# Patient Record
Sex: Male | Born: 1965 | Race: White | Hispanic: No | Marital: Married | Smoking: Former smoker
Health system: Southern US, Community
[De-identification: ages and names within clinical notes are randomized; demographics above are authoritative.]

## PROBLEM LIST (undated history)

## (undated) DIAGNOSIS — F909 Attention-deficit hyperactivity disorder, unspecified type: Secondary | ICD-10-CM

## (undated) DIAGNOSIS — J45909 Unspecified asthma, uncomplicated: Secondary | ICD-10-CM

## (undated) HISTORY — PX: COLON SURGERY: SHX602

## (undated) HISTORY — DX: Attention-deficit hyperactivity disorder, unspecified type: F90.9

## (undated) HISTORY — PX: CHOLECYSTECTOMY: SHX55

---

## 2007-11-12 ENCOUNTER — Ambulatory Visit: Payer: Self-pay | Admitting: Internal Medicine

## 2008-01-08 ENCOUNTER — Ambulatory Visit: Payer: Self-pay | Admitting: Internal Medicine

## 2010-02-23 ENCOUNTER — Ambulatory Visit: Payer: Self-pay | Admitting: Internal Medicine

## 2015-02-25 ENCOUNTER — Ambulatory Visit (INDEPENDENT_AMBULATORY_CARE_PROVIDER_SITE_OTHER): Payer: 59 | Admitting: Psychiatry

## 2015-02-25 ENCOUNTER — Encounter: Payer: Self-pay | Admitting: Psychiatry

## 2015-02-25 VITALS — BP 122/84 | HR 80 | Temp 98.1°F | Ht 69.5 in | Wt 138.6 lb

## 2015-02-25 DIAGNOSIS — F902 Attention-deficit hyperactivity disorder, combined type: Secondary | ICD-10-CM | POA: Diagnosis not present

## 2015-02-25 MED ORDER — AMPHETAMINE-DEXTROAMPHETAMINE 20 MG PO TABS: 20.0000 mg | ORAL_TABLET | Freq: Two times a day (BID) | ORAL | Status: DC

## 2015-02-25 NOTE — Progress Notes (Signed)
BH MD/PA/NP OP Progress Note  02/25/2015 4:17 PM Vincent BaleRobert L [name]  MRN:  [id number]  Subjective:  Patient reports that he is feeling good. Concentration and focus remain stable. Mood is good. There is no sign of hyperactivity or anger or mood lability. Tolerating medicine well without any side effects. He is optimistic and upbeat about the future. Sleeping well. No new physical complaints. Chief Complaint:  Visit Diagnosis:     ICD-9-CM ICD-10-CM   1. ADHD (attention deficit hyperactivity disorder), combined type 314.01 F90.2     Past Medical History:  Past Medical History  Diagnosis Date  . ADHD (attention deficit hyperactivity disorder)     Past Surgical History  Procedure Laterality Date  . Cholecystectomy     Family History:  Family History  Problem Relation Age of Onset  . Diabetes Mother   . Depression Sister    Social History:  History   Social History  . Marital Status: Married    Spouse Name: N/A  . Number of Children: N/A  . Years of Education: N/A   Social History Main Topics  . Smoking status: Former Smoker    Quit date: 08/19/2014  . Smokeless tobacco: Never Used  . Alcohol Use: No  . Drug Use: No  . Sexual Activity: No   Other Topics Concern  . None   Social History Narrative  . None   Additional History: He reports that he has been stable on his medicine. Work is going well. Social life is active. No major changes coming up in the near future. Patient can continue current dose of Adderall 20 mg twice a day.  Assessment:   Musculoskeletal: Strength & Muscle Tone: within normal limits Gait & Station: normal Patient leans: N/A  Psychiatric Specialty Exam: HPI  ROS  Blood pressure 122/84, pulse 80, temperature 98.1 F (36.7 C), temperature source Tympanic, height 5' 9.5" (1.765 m), weight 138 lb 9.6 oz (62.869 kg), SpO2 94 %.Body mass index is 20.18 kg/(m^2).  General Appearance: Well Groomed  Eye Contact:  Good  Speech:  Normal Rate  Volume:   Normal  Mood:  Euthymic  Affect:  Congruent  Thought Process:  Coherent  Orientation:  Full (Time, Place, and Person)  Thought Content:  Negative  Suicidal Thoughts:  No  Homicidal Thoughts:  No  Memory:  Immediate;   Good Recent;   Good Remote;   Good  Judgement:  Intact  Insight:  Good  Psychomotor Activity:  Normal  Concentration:  Good  Recall:  Good  Fund of Knowledge: Good  Language: Good  Akathisia:  No  Handed:  Right  AIMS (if indicated):         Assets:  Communication Skills Desire for Improvement Financial Resources/Insurance Housing Physical Health Resilience  ADL's:  Intact  Cognition: WNL  Sleep:  good   Is the patient at risk to self?  No. Has the patient been a risk to self in the past 6 months?  No. Has the patient been a risk to self within the distant past?  No. Is the patient a risk to others?  No. Has the patient been a risk to others in the past 6 months?  No. Has the patient been a risk to others within the distant past?  No.  Current Medications: Current Outpatient Prescriptions  Medication Sig Dispense Refill  . amphetamine-dextroamphetamine (ADDERALL) 20 MG tablet Take 1 tablet (20 mg total) by mouth 2 (two) times daily. 60 tablet 0   No current facility-administered  medications for this visit.    Medical Decision Making:  Established Problem, Stable/Improving (1), Review of Psycho-Social Stressors (1) and Review of Medication Regimen & Side Effects (2)  Treatment Plan Summary:Medication management and Plan Review medication. Review side effects. Supportive and educational counseling completed. No change to prescription. Recommend follow-up in another month. Patient agreeable to the plan.   John Clapacs 02/25/2015, 4:17 PM

## 2015-02-26 MED ORDER — AMPHETAMINE-DEXTROAMPHETAMINE 20 MG PO TABS: 20.0000 mg | ORAL_TABLET | Freq: Two times a day (BID) | ORAL | Status: DC

## 2015-02-26 NOTE — Addendum Note (Signed)
Addended by: Mordecai RasmussenLAPACS, JOHN T on: 02/26/2015 09:19 AM   Modules accepted: Orders

## 2015-03-25 ENCOUNTER — Encounter: Payer: Self-pay | Admitting: Psychiatry

## 2015-03-25 ENCOUNTER — Ambulatory Visit (INDEPENDENT_AMBULATORY_CARE_PROVIDER_SITE_OTHER): Payer: 59 | Admitting: Psychiatry

## 2015-03-25 VITALS — BP 110/76 | HR 79 | Temp 97.0°F | Ht 70.0 in | Wt 139.4 lb

## 2015-03-25 DIAGNOSIS — F902 Attention-deficit hyperactivity disorder, combined type: Secondary | ICD-10-CM

## 2015-03-25 DIAGNOSIS — F32A Depression, unspecified: Secondary | ICD-10-CM | POA: Insufficient documentation

## 2015-03-25 DIAGNOSIS — F988 Other specified behavioral and emotional disorders with onset usually occurring in childhood and adolescence: Secondary | ICD-10-CM | POA: Insufficient documentation

## 2015-03-25 DIAGNOSIS — F419 Anxiety disorder, unspecified: Secondary | ICD-10-CM | POA: Insufficient documentation

## 2015-03-25 MED ORDER — AMPHETAMINE-DEXTROAMPHETAMINE 20 MG PO TABS: 20.0000 mg | ORAL_TABLET | Freq: Two times a day (BID) | ORAL | Status: DC

## 2015-03-25 NOTE — Progress Notes (Signed)
BH MD/PA/NP OP Progress Note  03/25/2015 6:54 PM Vincent BaleRobert L [name]  MRN:  [id number]  Subjective:  "I'm really doing very well" Chief Complaint:  mood is good. Concentration and focus are good. Visit Diagnosis:  No diagnosis found. ADHD  Past Medical History:  Past Medical History  Diagnosis Date  . ADHD (attention deficit hyperactivity disorder)     Past Surgical History  Procedure Laterality Date  . Cholecystectomy     Family History:  Family History  Problem Relation Age of Onset  . Diabetes Mother   . Alzheimer's disease Mother   . Depression Sister   . Hypertension Father    Social History:  History   Social History  . Marital Status: Married    Spouse Name: N/A  . Number of Children: N/A  . Years of Education: N/A   Social History Main Topics  . Smoking status: Former Smoker    Quit date: 08/19/2014  . Smokeless tobacco: Never Used  . Alcohol Use: Yes     Comment: special - occ  . Drug Use: No  . Sexual Activity: No   Other Topics Concern  . None   Social History Narrative   Additional History: Follow-up for this patient with ADHD. This patient is reporting feeling very good this month. Not manic but functioning very well. Mood is upbeat. Many positive things going on. He is dating, his job is going well, his health is good. No sign of any problem side effects or complications from his medicine. Focus is good. Concentration good.  Assessment: Stable. Tolerating medicine well. No new side effects. Benefits from monthly counseling and review of symptoms.  Musculoskeletal: Strength & Muscle Tone: within normal limits Gait & Station: normal Patient leans: N/A  Psychiatric Specialty Exam: HPI  ROS  Blood pressure 110/76, pulse 79, temperature 97 F (36.1 C), temperature source Tympanic, height 5\' 10"  (1.778 m), weight 139 lb 6.4 oz (63.231 kg), SpO2 95 %.Body mass index is 20 kg/(m^2).  General Appearance: Neat and Well Groomed  Eye Contact:  Good   Speech:  Clear and Coherent  Volume:  Normal  Mood:  Euthymic  Affect:  Appropriate  Thought Process:  Goal Directed  Orientation:  Full (Time, Place, and Person)  Thought Content:  Negative  Suicidal Thoughts:  No  Homicidal Thoughts:  No  Memory:  Immediate;   Good Recent;   Good Remote;   Good  Judgement:  Good  Insight:  Good  Psychomotor Activity:  Normal  Concentration:  Good  Recall:  Good  Fund of Knowledge: Good  Language: Good  Akathisia:  No  Handed:  Right  AIMS (if indicated):     Assets:  Communication Skills Desire for Improvement Financial Resources/Insurance Housing Intimacy Physical Health Resilience Talents/Skills Vocational/Educational  ADL's:  Intact  Cognition: WNL  Sleep:  good   Is the patient at risk to self?  No. Has the patient been a risk to self in the past 6 months?  No. Has the patient been a risk to self within the distant past?  No. Is the patient a risk to others?  No. Has the patient been a risk to others in the past 6 months?  No. Has the patient been a risk to others within the distant past?  No.  Current Medications: Current Outpatient Prescriptions  Medication Sig Dispense Refill  . amphetamine-dextroamphetamine (ADDERALL) 20 MG tablet Take 1 tablet (20 mg total) by mouth 2 (two) times daily. 60 tablet 0  No current facility-administered medications for this visit.    Medical Decision Making:  Established Problem, Stable/Improving (1), Review of Psycho-Social Stressors (1), Review of Last Therapy Session (1) and Review of Medication Regimen & Side Effects (2)  Treatment Plan Summary:Medication management and Plan Supportive counseling. Review of current symptoms. No change to treatment. Follow-up next month   John Clapacs 03/25/2015, 6:54 PM

## 2015-04-22 ENCOUNTER — Ambulatory Visit (INDEPENDENT_AMBULATORY_CARE_PROVIDER_SITE_OTHER): Payer: 59 | Admitting: Psychiatry

## 2015-04-22 ENCOUNTER — Encounter: Payer: Self-pay | Admitting: Psychiatry

## 2015-04-22 VITALS — BP 110/82 | HR 75 | Temp 97.9°F | Ht 70.0 in | Wt 141.4 lb

## 2015-04-22 DIAGNOSIS — F341 Dysthymic disorder: Secondary | ICD-10-CM

## 2015-04-22 DIAGNOSIS — F902 Attention-deficit hyperactivity disorder, combined type: Secondary | ICD-10-CM | POA: Diagnosis not present

## 2015-04-22 MED ORDER — AMPHETAMINE-DEXTROAMPHETAMINE 20 MG PO TABS
20.0000 mg | ORAL_TABLET | Freq: Two times a day (BID) | ORAL | Status: DC
Start: 2015-04-22 — End: 2015-05-27

## 2015-04-22 NOTE — Progress Notes (Signed)
[facility] MD Progress Note  04/22/2015 6:18 PM Vincent [name]  MRN:  [id number] Subjective:  Follow-up note for this patient with ADHD mild chronic anxiety dysthymia. Principal Problem: @ Diagnosis:   Patient Active Problem List   Diagnosis Date Noted  . Dysthymia [F34.1] 04/22/2015  . ADD (attention deficit disorder) [F90.9] 03/25/2015  . Anxiety [F41.9] 03/25/2015  . ADHD (attention deficit hyperactivity disorder), combined type [F90.2] 02/25/2015   Total Time spent with patient: 30 minutes   Past Medical History:  Past Medical History  Diagnosis Date  . ADHD (attention deficit hyperactivity disorder)     Past Surgical History  Procedure Laterality Date  . Cholecystectomy     Family History:  Family History  Problem Relation Age of Onset  . Diabetes Mother   . Alzheimer's disease Mother   . Depression Sister   . Hypertension Father    Social History:  History  Alcohol Use  . Yes    Comment: special - occ     History  Drug Use No    History   Social History  . Marital Status: Married    Spouse Name: N/A  . Number of Children: N/A  . Years of Education: N/A   Social History Main Topics  . Smoking status: Former Smoker    Quit date: 08/19/2014  . Smokeless tobacco: Never Used  . Alcohol Use: Yes     Comment: special - occ  . Drug Use: No  . Sexual Activity: No   Other Topics Concern  . None   Social History Narrative   Additional History:    Sleep: Good  Appetite:  Good   Assessment: Patient had a good month. His relationship with his girlfriend seems to be improving. Work is going well. Focus and concentration remain good. No side effects of stimulants. His anxiety level is under good control and overall his mood and self-confidence have improved. No new complaints.  Musculoskeletal: Strength & Muscle Tone: within normal limits Gait & Station: normal Patient leans: N/A   Psychiatric Specialty Exam: Physical Exam  ROS  Blood pressure  110/82, pulse 75, temperature 97.9 F (36.6 C), temperature source Tympanic, height  (1.778 m), weight 141 lb 6.4 oz (64.139 kg), SpO2 96 %.Body mass index is 20.29 kg/(m^2).  General Appearance: Neat and Well Groomed  Patent attorney::  Good  Speech:  Clear and Coherent  Volume:  Normal  Mood:  Euthymic  Affect:  Full Range  Thought Process:  Coherent  Orientation:  Full (Time, Place, and Person)  Thought Content:  Negative  Suicidal Thoughts:  No  Homicidal Thoughts:  No  Memory:  Immediate;   Good Recent;   Good Remote;   Good  Judgement:  Intact  Insight:  Present  Psychomotor Activity:  Normal  Concentration:  Good  Recall:  Good  Fund of Knowledge:Good  Language: Good  Akathisia:  No  Handed:  Right  AIMS (if indicated):     Assets:  Communication Skills Desire for Improvement Financial Resources/Insurance Housing Intimacy Physical Health Resilience Social Support Talents/Skills Transportation Vocational/Educational  ADL's:  Intact  Cognition: WNL  Sleep:        Current Medications: Current Outpatient Prescriptions  Medication Sig Dispense Refill  . amphetamine-dextroamphetamine (ADDERALL) 20 MG tablet Take 1 tablet (20 mg total) by mouth 2 (two) times daily. 60 tablet 0   No current facility-administered medications for this visit.    Lab Results: No results found for this or any previous visit (from  the past 48 hour(s)).  Physical Findings: AIMS:  , ,  ,  ,    CIWA:    COWS:     Treatment Plan Summary: Medication management and Plan Vincent [name] continues to do well. He benefits from frequent monthly check ups reviewing mood and anxiety symptoms. Supportive counseling completed. Renewed Adderall prescription which she is tolerating well. No change to current treatment plan. Follow-up next month.   Medical Decision Making:  Established Problem, Stable/Improving (1), Review of Psycho-Social Stressors (1) and Review of Medication Regimen & Side  Effects (2)     Vincent [name] 04/22/2015, 6:18 PM

## 2015-05-20 ENCOUNTER — Telehealth: Payer: Self-pay | Admitting: Psychiatry

## 2015-05-20 ENCOUNTER — Ambulatory Visit: Payer: 59 | Admitting: Psychiatry

## 2015-05-21 ENCOUNTER — Ambulatory Visit: Payer: 59

## 2015-05-21 ENCOUNTER — Ambulatory Visit
Admission: EM | Admit: 2015-05-21 | Discharge: 2015-05-21 | Disposition: A | Discharge status: Home / Self Care | Payer: 59 | Attending: Family Medicine | Admitting: Family Medicine

## 2015-05-21 DIAGNOSIS — Z87891 Personal history of nicotine dependence: Secondary | ICD-10-CM | POA: Diagnosis not present

## 2015-05-21 DIAGNOSIS — T148 Other injury of unspecified body region: Secondary | ICD-10-CM | POA: Diagnosis not present

## 2015-05-21 DIAGNOSIS — F419 Anxiety disorder, unspecified: Secondary | ICD-10-CM | POA: Diagnosis not present

## 2015-05-21 DIAGNOSIS — S2232XA Fracture of one rib, left side, initial encounter for closed fracture: Secondary | ICD-10-CM | POA: Insufficient documentation

## 2015-05-21 DIAGNOSIS — R079 Chest pain, unspecified: Secondary | ICD-10-CM | POA: Diagnosis present

## 2015-05-21 DIAGNOSIS — F902 Attention-deficit hyperactivity disorder, combined type: Secondary | ICD-10-CM | POA: Insufficient documentation

## 2015-05-21 DIAGNOSIS — F341 Dysthymic disorder: Secondary | ICD-10-CM | POA: Insufficient documentation

## 2015-05-21 DIAGNOSIS — W19XXXA Unspecified fall, initial encounter: Secondary | ICD-10-CM | POA: Diagnosis not present

## 2015-05-21 DIAGNOSIS — T148XXA Other injury of unspecified body region, initial encounter: Secondary | ICD-10-CM

## 2015-05-21 MED ORDER — TETANUS-DIPHTH-ACELL PERTUSSIS 5-2.5-18.5 LF-MCG/0.5 IM SUSP
0.5000 mL | Freq: Once | INTRAMUSCULAR | Status: AC
  Administered 2015-05-21: 0.5 mL via INTRAMUSCULAR

## 2015-05-21 MED ORDER — IBUPROFEN 600 MG PO TABS: 600.0000 mg | ORAL_TABLET | Freq: Three times a day (TID) | ORAL | Status: DC | PRN

## 2015-05-21 MED ORDER — OXYCODONE-ACETAMINOPHEN 5-325 MG PO TABS: 1.0000 | ORAL_TABLET | Freq: Three times a day (TID) | ORAL | Status: DC | PRN

## 2015-05-21 NOTE — ED Provider Notes (Signed)
[facility] Emergency Department Provider Note  ____________________________________________  Time seen: Approximately 6:33 PM  I have reviewed the triage vital signs and the nursing notes.   HISTORY  Chief Complaint Fall and Chest Pain   HPI Vincent [name] is a 49 y.o. male presents with complaint of left rib and right thumb pain. Patient reports that yesterday afternoon he was going for a walk and his shoe string was untied. Patient states that he stepped on the shoestring with his other foot causing him to trip forward. Patient states that he caught himself with right hand but hit left rib on concrete. Denies head injury or loss of consciousness.  Patient presents today for the left rib pain. Patient states pain is primarily with movement. States pain in left RIBS is 8 out of 10 and sharp with movement as well as deep breath. Patient states that if he sits still and is not taking an active deep breath ribs do not hurt.States ribs do NOT hurt with normal breaths. Denies chest pain or shortness of breath. States right thumb pain is mild at 2 out of 10. States unsure of last tetanus immunization. Denies head injury or loss consciousness. Denies other pain or injury.   Past Medical History  Diagnosis Date  . ADHD (attention deficit hyperactivity disorder)     Patient Active Problem List   Diagnosis Date Noted  . Dysthymia 04/22/2015  . ADD (attention deficit disorder) 03/25/2015  . Anxiety 03/25/2015  . ADHD (attention deficit hyperactivity disorder), combined type 02/25/2015    Past Surgical History  Procedure Laterality Date  . Cholecystectomy    . Colon surgery      Current Outpatient Rx  Name  Route  Sig  Dispense  Refill  .            . amphetamine-dextroamphetamine (ADDERALL) 20 MG tablet   Oral   Take 1 tablet (20 mg total) by mouth 2 (two) times daily.   60 tablet   0     Allergies Review of patient's allergies indicates no known  allergies.  Family History  Problem Relation Age of Onset  . Diabetes Mother   . Alzheimer's disease Mother   . Depression Sister   . Hypertension Father     Social History Social History  Substance Use Topics  . Smoking status: Former Smoker    Quit date: 08/19/2014  . Smokeless tobacco: Never Used  . Alcohol Use: Yes     Comment: special - occ   PCP: clapacs   Review of Systems Constitutional: No fever/chills Eyes: No visual changes. ENT: No sore throat. Cardiovascular: Denies chest pain. Respiratory: Denies shortness of breath. Gastrointestinal: No abdominal pain.  No nausea, no vomiting.  No diarrhea.  No constipation. Genitourinary: Negative for dysuria. Musculoskeletal: Negative for back pain. Positive for left rib pain and right thumb pain.  Skin: Negative for rash. Neurological: Negative for headaches, focal weakness or numbness.  10-point ROS otherwise negative.  ____________________________________________   PHYSICAL EXAM:  VITAL SIGNS: ED Triage Vitals  Enc Vitals Group     BP 05/21/15 1814 126/83 mmHg     Pulse Rate 05/21/15 1814 74     Resp 05/21/15 1814 16     Temp 05/21/15 1814 97.8 F (36.6 C)     Temp Source 05/21/15 1814 Tympanic     SpO2 05/21/15 1814 99 %     Weight 05/21/15 1814 135 lb (61.236 kg)     Height 05/21/15 1814 5'  10" (1.778 m)     Head Cir --      Peak Flow --      Pain Score 05/21/15 1817 5     Pain Loc --      Pain Edu? --      Excl. in GC? --     Constitutional: Alert and oriented. Well appearing and in no acute distress. Eyes: Conjunctivae are normal. PERRL. EOMI. Head: Atraumatic.   Nose: No congestion/rhinnorhea.  Mouth/Throat: Mucous membranes are moist.  Oropharynx non-erythematous. Neck: No stridor.  No cervical spine tenderness to palpation. Hematological/Lymphatic/Immunilogical: No cervical lymphadenopathy. Cardiovascular: Normal rate, regular rhythm. Grossly normal heart sounds.  Good peripheral  circulation. Respiratory: Normal respiratory effort.  No retractions. Lungs CTAB. Gastrointestinal: Soft and nontender. No distention. Normal Bowel sounds.  No abdominal bruits. No CVA tenderness. Musculoskeletal: No lower or upper extremity tenderness nor edema.  No joint effusions. Bilateral pedal pulses equal and easily palpated. No cervical, thoracic or lumbar TTP.  Left lateral rib pain approximately 7-10 ribs lateral, mild ecchymosis, no swelling, skin intact, no palpable rib fracture. Changes positions from lying to standing quickly without distress or difficulty.  Right thumb mild TTP with superficial abrasions small x 2, full ROM, mild ecchymosis and swelling, no motor or tendon deficit.  Neurologic:  Normal speech and language. No gross focal neurologic deficits are appreciated. No gait instability. Skin:  Skin is warm, dry and intact. No rash noted.except: see musculoskeletal above.  Psychiatric: Mood and affect are normal. Speech and behavior are normal.  ____________________________________________   LABS (all labs ordered are listed, but only abnormal results are displayed)  Labs Reviewed - No data to display ____________________________________________  RADIOLOGY  EXAM: LEFT RIBS AND CHEST - 3+ VIEW  COMPARISON: 05/24/2009 abdominal CT.  FINDINGS: Frontal view of the chest and four views of left-sided ribs. Frontal view of the chest demonstrates hyperinflation. Midline trachea. Moderate convex right thoracolumbar spine curvature. This distorts the appearance of mediastinum. Normal heart size. No pleural effusion or pneumothorax.  Left rib films demonstrate a radiographic marker projecting at approximately the level of the tenth and eleventh posterior lateral left ribs. Mild irregularity involves the anterior aspect of the eighth through tenth ribs, felt to be new since 05/24/2009. Especially the ninth and tenth fractures are favored to be  acute.  IMPRESSION: Anterior lower left rib fractures, likely acute. No pleural fluid or pneumothorax identified.  Hyperinflation, suggesting COPD.   Electronically Signed By: Jeronimo Greaves M.D. On: 05/21/2015 19:32          DG Finger Thumb Right (Final result) Result time: 05/21/15 19:26:49   Final result by Rad Results In Interface (05/21/15 19:26:49)   Narrative:   CLINICAL DATA: Pain after fall yesterday. Initial encounter.  EXAM: RIGHT THUMB 2+V  COMPARISON: None.  FINDINGS: No acute fracture or dislocation.  IMPRESSION: No acute osseous abnormality.   Electronically Signed By: Jeronimo Greaves M.D. On: 05/21/2015 19:26      I, Renford Dills, personally viewed and evaluated these images (plain radiographs) as part of my medical decision making.   ____________________________________________   INITIAL IMPRESSION / ASSESSMENT AND PLAN / ED COURSE  Pertinent labs & imaging results that were available during my care of the patient were reviewed by me and considered in my medical decision making (see chart for details).   Chest xray positive for mild irregularity involving anterior aspect of the eighth through tenth ribs, especially the ninth and tenth fractures favored to be acute. No pleural effusion  or pneumothorax. Moderate convex right thoracolumbar spine curvature.   Presents post mechanical fall yesterday. Left rib pain and right thumb pain. Patient reports left rib pain with movement and deep breathing. Denies pain if still and not actively taking deep breath. Rib fractures 8, 9 and 10 mild irregularity, nondisplaced. Right thumb no acute osseous injury. Incentive spirometer given and directed. Discussed ice, rest, no strenuous activity. Deep breaths.PRN ibuprofen and percocet. Discussed follow up with PCP next week. Discussed return or go to ER for increased pain, fever or worsening concerns. Patient verbalized understanding and agreed to  plan.  ____________________________________________   FINAL CLINICAL IMPRESSION(S) / ED DIAGNOSES  Final diagnoses:  Left rib fracture, closed, initial encounter  Abrasion  Contusion       Renford Dills, NP 05/21/15 2050

## 2015-05-21 NOTE — ED Notes (Signed)
Pt reports he tripped over his shoelaces yesterday and on concrete. Pt fell on right hand, and left ribs. Pt unsure of when last Tdap was. Rib pain is exacerbated with breathing. States it is a sharp pain.

## 2015-05-21 NOTE — Discharge Instructions (Signed)
Take medication as prescribed. Apply ice. No strenuous activity. Avoid heavy lifting. Take deep breaths multiple times throughout the day to ensure adequate lung expansion.  Follow-up with her primary care physician next week. Return to the urgent care or ER for increased pain, shortness of breath, chest pain, new or worsening concerns.  Abrasion An abrasion is a cut or scrape of the skin. Abrasions do not extend through all layers of the skin and most heal within 10 days. It is important to care for your abrasion properly to prevent infection. CAUSES  Most abrasions are caused by falling on, or gliding across, the ground or other surface. When your skin rubs on something, the outer and inner layer of skin rubs off, causing an abrasion. DIAGNOSIS  Your caregiver will be able to diagnose an abrasion during a physical exam.  TREATMENT  Your treatment depends on how large and deep the abrasion is. Generally, your abrasion will be cleaned with water and a mild soap to remove any dirt or debris. An antibiotic ointment may be put over the abrasion to prevent an infection. A bandage (dressing) may be wrapped around the abrasion to keep it from getting dirty.  You may need a tetanus shot if:  You cannot remember when you had your last tetanus shot.  You have never had a tetanus shot.  The injury broke your skin. If you get a tetanus shot, your arm may swell, get red, and feel warm to the touch. This is common and not a problem. If you need a tetanus shot and you choose not to have one, there is a rare chance of getting tetanus. Sickness from tetanus can be serious.  HOME CARE INSTRUCTIONS   If a dressing was applied, change it at least once a day or as directed by your caregiver. If the bandage sticks, soak it off with warm water.   Wash the area with water and a mild soap to remove all the ointment 2 times a day. Rinse off the soap and pat the area dry with a clean towel.   Reapply any ointment  as directed by your caregiver. This will help prevent infection and keep the bandage from sticking. Use gauze over the wound and under the dressing to help keep the bandage from sticking.   Change your dressing right away if it becomes wet or dirty.   Only take over-the-counter or prescription medicines for pain, discomfort, or fever as directed by your caregiver.   Follow up with your caregiver within 24-48 hours for a wound check, or as directed. If you were not given a wound-check appointment, look closely at your abrasion for redness, swelling, or pus. These are signs of infection. SEEK IMMEDIATE MEDICAL CARE IF:   You have increasing pain in the wound.   You have redness, swelling, or tenderness around the wound.   You have pus coming from the wound.   You have a fever or persistent symptoms for more than 2-3 days.  You have a fever and your symptoms suddenly get worse.  You have a bad smell coming from the wound or dressing.  MAKE SURE YOU:   Understand these instructions.  Will watch your condition.  Will get help right away if you are not doing well or get worse. Document Released: 06/21/2005 Document Revised: 08/28/2012 Document Reviewed: 08/15/2011 [facility] Patient Information 2015 [place], [place]. This information is not intended to replace advice given to you by your health care provider. Make sure you discuss any  questions you have with your health care provider.  Contusion A contusion is a deep bruise. Contusions happen when an injury causes bleeding under the skin. Signs of bruising include pain, puffiness (swelling), and discolored skin. The contusion may turn blue, purple, or yellow. HOME CARE   Put ice on the injured area.  Put ice in a plastic bag.  Place a towel between your skin and the bag.  Leave the ice on for 15-20 minutes, 03-04 times a day.  Only take medicine as told by your doctor.  Rest the injured area.  If possible, raise (elevate)  the injured area to lessen puffiness. GET HELP RIGHT AWAY IF:   You have more bruising or puffiness.  You have pain that is getting worse.  Your puffiness or pain is not helped by medicine. MAKE SURE YOU:   Understand these instructions.  Will watch your condition.  Will get help right away if you are not doing well or get worse. Document Released: 02/28/2008 Document Revised: 12/04/2011 Document Reviewed: 07/17/2011 [facility] Patient Information 2015 [place], [place]. This information is not intended to replace advice given to you by your health care provider. Make sure you discuss any questions you have with your health care provider.  Rib Fracture A rib fracture is a break or crack in one of the bones of the ribs. The ribs are a group of long, curved bones that wrap around your chest and attach to your spine. They protect your lungs and other organs in the chest cavity. A broken or cracked rib is often painful, but most do not cause other problems. Most rib fractures heal on their own over time. However, rib fractures can be more serious if multiple ribs are broken or if broken ribs move out of place and push against other structures. CAUSES   A direct blow to the chest. For example, this could happen during contact sports, a car accident, or a fall against a hard object.  Repetitive movements with high force, such as pitching a baseball or having severe coughing spells. SYMPTOMS   Pain when you breathe in or cough.  Pain when someone presses on the injured area. DIAGNOSIS  Your caregiver will perform a physical exam. Various imaging tests may be ordered to confirm the diagnosis and to look for related injuries. These tests may include a chest X-ray, computed tomography (CT), magnetic resonance imaging (MRI), or a bone scan. TREATMENT  Rib fractures usually heal on their own in 1-3 months. The longer healing period is often associated with a continued cough or other aggravating  activities. During the healing period, pain control is very important. Medication is usually given to control pain. Hospitalization or surgery may be needed for more severe injuries, such as those in which multiple ribs are broken or the ribs have moved out of place.  HOME CARE INSTRUCTIONS   Avoid strenuous activity and any activities or movements that cause pain. Be careful during activities and avoid bumping the injured rib.  Gradually increase activity as directed by your caregiver.  Only take over-the-counter or prescription medications as directed by your caregiver. Do not take other medications without asking your caregiver first.  Apply ice to the injured area for the first 1-2 days after you have been treated or as directed by your caregiver. Applying ice helps to reduce inflammation and pain.  Put ice in a plastic bag.  Place a towel between your skin and the bag.   Leave the ice on for 15-20  minutes at a time, every 2 hours while you are awake.  Perform deep breathing as directed by your caregiver. This will help prevent pneumonia, which is a common complication of a broken rib. Your caregiver may instruct you to:  Take deep breaths several times a day.  Try to cough several times a day, holding a pillow against the injured area.  Use a device called an incentive spirometer to practice deep breathing several times a day.  Drink enough fluids to keep your urine clear or pale yellow. This will help you avoid constipation.   Do not wear a rib belt or binder. These restrict breathing, which can lead to pneumonia.  SEEK IMMEDIATE MEDICAL CARE IF:   You have a fever.   You have difficulty breathing or shortness of breath.   You develop a continual cough, or you cough up thick or bloody sputum.  You feel sick to your stomach (nausea), throw up (vomit), or have abdominal pain.   You have worsening pain not controlled with medications.  MAKE SURE YOU:  Understand  these instructions.  Will watch your condition.  Will get help right away if you are not doing well or get worse. Document Released: 09/11/2005 Document Revised: 05/14/2013 Document Reviewed: 11/13/2012 [facility] Patient Information 2015 [place], [place]. This information is not intended to replace advice given to you by your health care provider. Make sure you discuss any questions you have with your health care provider.

## 2015-05-21 NOTE — Telephone Encounter (Signed)
Yes I will do that and leave it at the front desk

## 2015-05-24 ENCOUNTER — Other Ambulatory Visit: Payer: Self-pay

## 2015-05-24 NOTE — Telephone Encounter (Signed)
pt missed his appt for 05-20-15 pt made new appt for 05-27-15 . pt needs a refill on adderall  pt will not have enought to do until his appt .pt will be out of medication tuesday

## 2015-05-27 ENCOUNTER — Ambulatory Visit (INDEPENDENT_AMBULATORY_CARE_PROVIDER_SITE_OTHER): Payer: 59 | Admitting: Psychiatry

## 2015-05-27 ENCOUNTER — Encounter: Payer: Self-pay | Admitting: Psychiatry

## 2015-05-27 VITALS — BP 100/58 | HR 91 | Temp 98.0°F | Ht 70.0 in | Wt 140.8 lb

## 2015-05-27 DIAGNOSIS — F902 Attention-deficit hyperactivity disorder, combined type: Secondary | ICD-10-CM | POA: Diagnosis not present

## 2015-05-27 MED ORDER — AMPHETAMINE-DEXTROAMPHETAMINE 20 MG PO TABS: 20.0000 mg | ORAL_TABLET | Freq: Two times a day (BID) | ORAL | Status: DC

## 2015-05-27 NOTE — Progress Notes (Signed)
[facility] MD Progress Note  05/27/2015 2:00 PM Vincent [name]  MRN:  [id number] Subjective:  Patient overall is feeling good. Focus and attention are good. Mood is good. Had a rough day today because his car broke down. No side effects of medicine. No complaints of depression. Principal Problem: @ Diagnosis:   Patient Active Problem List   Diagnosis Date Noted  . Dysthymia [F34.1] 04/22/2015  . ADD (attention deficit disorder) [F90.9] 03/25/2015  . Anxiety [F41.9] 03/25/2015  . ADHD (attention deficit hyperactivity disorder), combined type [F90.2] 02/25/2015   Total Time spent with patient: 20 minutes   Past Medical History:  Past Medical History  Diagnosis Date  . ADHD (attention deficit hyperactivity disorder)     Past Surgical History  Procedure Laterality Date  . Cholecystectomy    . Colon surgery     Family History:  Family History  Problem Relation Age of Onset  . Diabetes Mother   . Alzheimer's disease Mother   . Depression Sister   . Hypertension Father    Social History:  History  Alcohol Use  . Yes    Comment: special - occ     History  Drug Use No    Social History   Social History  . Marital Status: Married    Spouse Name: N/A  . Number of Children: N/A  . Years of Education: N/A   Social History Main Topics  . Smoking status: Former Smoker    Quit date: 08/19/2014  . Smokeless tobacco: Never Used  . Alcohol Use: Yes     Comment: special - occ  . Drug Use: No  . Sexual Activity: No   Other Topics Concern  . None   Social History Narrative   Additional History:    Sleep: Good  Appetite:  Good   Assessment:   Musculoskeletal: Strength & Muscle Tone: within normal limits Gait & Station: normal Patient leans: N/A   Psychiatric Specialty Exam: Physical Exam  ROS  Blood pressure 100/58, pulse 91, temperature 98 F (36.7 C), temperature source Tympanic, height  (1.778 m), weight 140 lb 12.8 oz (63.866 kg), SpO2 95 %.Body  mass index is 20.2 kg/(m^2).  General Appearance: Casual  Eye Contact::  Good  Speech:  Clear and Coherent  Volume:  Normal  Mood:  Euthymic  Affect:  Congruent  Thought Process:  Goal Directed  Orientation:  Full (Time, Place, and Person)  Thought Content:  Negative  Suicidal Thoughts:  No  Homicidal Thoughts:  No  Memory:  Immediate;   Good Recent;   Good Remote;   Good  Judgement:  Intact  Insight:  Good  Psychomotor Activity:  Normal  Concentration:  Good  Recall:  Good  Fund of Knowledge:Good  Language: Good  Akathisia:  No  Handed:  Right  AIMS (if indicated):     Assets:  Communication Skills Desire for Improvement Financial Resources/Insurance Housing Intimacy Leisure Time Physical Health Resilience Social Support Talents/Skills  ADL's:  Intact  Cognition: WNL  Sleep:        Current Medications: Current Outpatient Prescriptions  Medication Sig Dispense Refill  . amphetamine-dextroamphetamine (ADDERALL) 20 MG tablet Take 1 tablet (20 mg total) by mouth 2 (two) times daily. 60 tablet 0  . ibuprofen (ADVIL,MOTRIN) 600 MG tablet Take 1 tablet (600 mg total) by mouth every 8 (eight) hours as needed for mild pain or moderate pain. 15 tablet 0  . Ibuprofen-Diphenhydramine HCl 200-25 MG CAPS Take by mouth.    Marland Kitchen  oxyCODONE-acetaminophen (ROXICET) 5-325 MG per tablet Take 1 tablet by mouth every 8 (eight) hours as needed for moderate pain or severe pain (Do not drive or operate heavy machinery while taking as can cause drowsiness.). (Patient not taking: Reported on 05/27/2015) 10 tablet 0   No current facility-administered medications for this visit.    Lab Results: No results found for this or any previous visit (from the past 48 hour(s)).  Physical Findings: AIMS:  , ,  ,  ,    CIWA:    COWS:     Treatment Plan Summary: Medication management and Plan Fateh continues to do well. Tolerating current dose of Adderall. Good benefit. Vitals normal. No side effects.  No problems with anger. Sleeping well. Supportive counseling and review of medicine. He did recently break 3 ribs accidentally falling down but his pain is very manageable right now. Prescription renewed for Adderall. Follow-up next month.   Medical Decision Making:  Established Problem, Stable/Improving (1), Review of Psycho-Social Stressors (1) and Review of Medication Regimen & Side Effects (2)     Susie Ehresman 05/27/2015, 2:00 PM

## 2015-06-29 ENCOUNTER — Ambulatory Visit (INDEPENDENT_AMBULATORY_CARE_PROVIDER_SITE_OTHER): Payer: 59 | Admitting: Psychiatry

## 2015-06-29 ENCOUNTER — Encounter: Payer: Self-pay | Admitting: Psychiatry

## 2015-06-29 VITALS — BP 124/80 | HR 80 | Temp 97.8°F | Ht 70.0 in | Wt 142.6 lb

## 2015-06-29 DIAGNOSIS — F902 Attention-deficit hyperactivity disorder, combined type: Secondary | ICD-10-CM | POA: Diagnosis not present

## 2015-06-29 MED ORDER — AMPHETAMINE-DEXTROAMPHETAMINE 20 MG PO TABS: 20.0000 mg | ORAL_TABLET | Freq: Two times a day (BID) | ORAL | Status: DC

## 2015-07-03 NOTE — Progress Notes (Signed)
[facility] MD Progress Note  07/03/2015 4:46 PM Vincent [name]  MRN:  [id number] Subjective:  Follow-up for patient with ADHD. Mood is good. He is having good functioning at work and home. No signs of side effects of medicine. No sign of new mood problems Principal Problem: @ Diagnosis:   Patient Active Problem List   Diagnosis Date Noted  . Dysthymia [F34.1] 04/22/2015  . ADD (attention deficit disorder) [F90.9] 03/25/2015  . Anxiety [F41.9] 03/25/2015  . ADHD (attention deficit hyperactivity disorder), combined type [F90.2] 02/25/2015   Total Time spent with patient: 20 minutes  Past Psychiatric History: Past history of ADHD and anxiety. No suicide attempts no hospitalization  Past Medical History:  Past Medical History  Diagnosis Date  . ADHD (attention deficit hyperactivity disorder)     Past Surgical History  Procedure Laterality Date  . Cholecystectomy    . Colon surgery     Family History:  Family History  Problem Relation Age of Onset  . Diabetes Mother   . Alzheimer's disease Mother   . Depression Sister   . Hypertension Father    Family Psychiatric  History: No reported family history of mental health problems Social History:  History  Alcohol Use  . Yes    Comment: special - occ     History  Drug Use No    Social History   Social History  . Marital Status: Married    Spouse Name: N/A  . Number of Children: N/A  . Years of Education: N/A   Social History Main Topics  . Smoking status: Former Smoker    Quit date: 08/19/2014  . Smokeless tobacco: Never Used  . Alcohol Use: Yes     Comment: special - occ  . Drug Use: No  . Sexual Activity: No   Other Topics Concern  . None   Social History Narrative   Additional Social History:                         Sleep: Good  Appetite:  Good  Current Medications: Current Outpatient Prescriptions  Medication Sig Dispense Refill  . amphetamine-dextroamphetamine (ADDERALL) 20 MG tablet  Take 1 tablet (20 mg total) by mouth 2 (two) times daily. 60 tablet 0  . ibuprofen (ADVIL,MOTRIN) 600 MG tablet Take 1 tablet (600 mg total) by mouth every 8 (eight) hours as needed for mild pain or moderate pain. (Patient not taking: Reported on 06/29/2015) 15 tablet 0  . Ibuprofen-Diphenhydramine HCl 200-25 MG CAPS Take by mouth.    . oxyCODONE-acetaminophen (ROXICET) 5-325 MG per tablet Take 1 tablet by mouth every 8 (eight) hours as needed for moderate pain or severe pain (Do not drive or operate heavy machinery while taking as can cause drowsiness.). (Patient not taking: Reported on 06/29/2015) 10 tablet 0   No current facility-administered medications for this visit.    Lab Results: No results found for this or any previous visit (from the past 48 hour(s)).  Physical Findings: AIMS:  , ,  ,  ,    CIWA:    COWS:     Musculoskeletal: Strength & Muscle Tone: within normal limits Gait & Station: normal Patient leans: N/A  Psychiatric Specialty Exam: ROS  Blood pressure 124/80, pulse 80, temperature 97.8 F (36.6 C), temperature source Tympanic, height  (1.778 m), weight 64.683 kg (142 lb 9.6 oz), SpO2 98 %.Body mass index is 20.46 kg/(m^2).  General Appearance: Well Groomed  Eye Contact::  Good  Speech:  Clear and Coherent  Volume:  Normal  Mood:  Euthymic  Affect:  Appropriate  Thought Process:  Goal Directed and Linear  Orientation:  Full (Time, Place, and Person)  Thought Content:  Negative  Suicidal Thoughts:  No  Homicidal Thoughts:  No  Memory:  Immediate;   Fair Recent;   Fair Remote;   Fair  Judgement:  Intact  Insight:  Good  Psychomotor Activity:  Normal  Concentration:  Good  Recall:  Good  Fund of Knowledge:Good  Language: Good  Akathisia:  No  Handed:  Right  AIMS (if indicated):     Assets:  Desire for Improvement Financial Resources/Insurance Housing Leisure Time Physical Health Resilience Social Support  ADL's:  Intact  Cognition: WNL   Sleep:      Treatment Plan Summary: Medication management and Plan Continue Adderall at current dose. Supportive therapy. We talked about his current life situation and gave him lots of support and assistance with his management of his social situation. No change to treatment. Follow-up next month.  Vincent [name] 07/03/2015, 4:46 PM

## 2015-07-29 ENCOUNTER — Encounter: Payer: Self-pay | Admitting: Psychiatry

## 2015-07-29 ENCOUNTER — Ambulatory Visit (INDEPENDENT_AMBULATORY_CARE_PROVIDER_SITE_OTHER): Payer: 59 | Admitting: Psychiatry

## 2015-07-29 VITALS — BP 118/76 | HR 98 | Temp 98.0°F | Wt 143.6 lb

## 2015-07-29 DIAGNOSIS — F902 Attention-deficit hyperactivity disorder, combined type: Secondary | ICD-10-CM

## 2015-07-29 MED ORDER — AMPHETAMINE-DEXTROAMPHETAMINE 20 MG PO TABS: 20.0000 mg | ORAL_TABLET | Freq: Two times a day (BID) | ORAL | Status: DC

## 2015-07-29 NOTE — Progress Notes (Signed)
[facility]  07/29/2015 8:06 PM Vincent BaleRobert L [name]  MRN:  [id number] Subjective:  Follow-up for patient with ADHD. Mood is good. He is having good functioning at work and home. No signs of side effects of medicine. No sign of new mood problems Principal Problem: @PPROB @ Diagnosis:   Patient Active Problem List   Diagnosis Date Noted  . Dysthymia [F34.1] 04/22/2015  . ADD (attention deficit disorder) [F90.9] 03/25/2015  . Anxiety [F41.9] 03/25/2015  . ADHD (attention deficit hyperactivity disorder), combined type [F90.2] 02/25/2015   Total Time spent with patient: 20 minutes  Past Psychiatric History: Past history of ADHD and anxiety. No suicide attempts no hospitalization  Past Medical History:  Past Medical History  Diagnosis Date  . ADHD (attention deficit hyperactivity disorder)     Past Surgical History  Procedure Laterality Date  . Cholecystectomy    . Colon surgery     Family History:  Family History  Problem Relation Age of Onset  . Diabetes Mother   . Alzheimer's disease Mother   . Depression Sister   . Hypertension Father    Family Psychiatric  History: No reported family history of mental health problems Social History:  History  Alcohol Use  . Yes    Comment: special - occ     History  Drug Use No    Social History   Social History  . Marital Status: Married    Spouse Name: N/A  . Number of Children: N/A  . Years of Education: N/A   Social History Main Topics  . Smoking status: Former Smoker    Quit date: 08/19/2014  . Smokeless tobacco: Never Used  . Alcohol Use: Yes     Comment: special - occ  . Drug Use: No  . Sexual Activity: No   Other Topics Concern  . None   Social History Narrative   Additional Social History:                         Sleep: Good  Appetite:  Good  Current Medications: Current Outpatient Prescriptions  Medication Sig Dispense Refill  . amphetamine-dextroamphetamine (ADDERALL) 20 MG tablet  Take 1 tablet (20 mg total) by mouth 2 (two) times daily. 60 tablet 0  . ibuprofen (ADVIL,MOTRIN) 600 MG tablet Take 1 tablet (600 mg total) by mouth every 8 (eight) hours as needed for mild pain or moderate pain. 15 tablet 0  . Ibuprofen-Diphenhydramine HCl 200-25 MG CAPS Take by mouth.    . oxyCODONE-acetaminophen (ROXICET) 5-325 MG per tablet Take 1 tablet by mouth every 8 (eight) hours as needed for moderate pain or severe pain (Do not drive or operate heavy machinery while taking as can cause drowsiness.). 10 tablet 0   No current facility-administered medications for this visit.    Lab Results: No results found for this or any previous visit (from the past 48 hour(s)).  Physical Findings: AIMS:  , ,  ,  ,    CIWA:    COWS:     Musculoskeletal: Strength & Muscle Tone: within normal limits Gait & Station: normal Patient leans: N/A  Psychiatric Specialty Exam: ROS   Blood pressure 118/76, pulse 98, temperature 98 F (36.7 C), temperature source Tympanic, weight 65.137 kg (143 lb 9.6 oz), SpO2 98 %.Body mass index is 20.6 kg/(m^2).  General Appearance: Well Groomed  Eye Contact::  Good  Speech:  Clear and Coherent  Volume:  Normal  Mood:  Euthymic  Affect:  Appropriate  Thought Process:  Goal Directed and Linear  Orientation:  Full (Time, Place, and Person)  Thought Content:  Negative  Suicidal Thoughts:  No  Homicidal Thoughts:  No  Memory:  Immediate;   Fair Recent;   Fair Remote;   Fair  Judgement:  Intact  Insight:  Good  Psychomotor Activity:  Normal  Concentration:  Good  Recall:  Good  Fund of Knowledge:Good  Language: Good  Akathisia:  No  Handed:  Right  AIMS (if indicated):     Assets:  Desire for Improvement Financial Resources/Insurance Housing Leisure Time Physical Health Resilience Social Support  ADL's:  Intact  Cognition: WNL  Sleep:      Treatment Plan Summary: Medication management and Plan Patient continues to do very good. His  medicine level seem to be right. He is sleeping well. Eating well. No sign of any agitation or side effects. Mood is upbeat and positive. Supportive counseling done. Reviewed medication plan. Follow-up one month.  John Clapacs 07/29/2015, 8:06 PM

## 2015-08-26 ENCOUNTER — Encounter: Payer: Self-pay | Admitting: Psychiatry

## 2015-08-26 ENCOUNTER — Ambulatory Visit (INDEPENDENT_AMBULATORY_CARE_PROVIDER_SITE_OTHER): Payer: 59 | Admitting: Psychiatry

## 2015-08-26 VITALS — BP 118/76 | HR 82 | Temp 97.3°F | Ht 70.0 in | Wt 146.0 lb

## 2015-08-26 DIAGNOSIS — F341 Dysthymic disorder: Secondary | ICD-10-CM

## 2015-08-26 DIAGNOSIS — F902 Attention-deficit hyperactivity disorder, combined type: Secondary | ICD-10-CM | POA: Diagnosis not present

## 2015-08-26 MED ORDER — AMPHETAMINE-DEXTROAMPHETAMINE 20 MG PO TABS: 20.0000 mg | ORAL_TABLET | Freq: Two times a day (BID) | ORAL | Status: DC

## 2015-08-26 NOTE — Progress Notes (Signed)
[facility] MD Progress Note  08/26/2015 2:12 PM Vincent [name]  MRN:  [id number] Subjective:  Follow-up for patient with ADHD and history of generalized anxiety. No new complaints. Mood and focus continue to be good. Concentration is good and his success at work and in his personal life is good. Got rehired for another half of a year at work. 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 Physical health is good and he is exercising regularly. No sign of any disordered thinking. No sign of any side effects from medicine Principal Problem: @ Diagnosis:   Patient Active Problem List   Diagnosis Date Noted  . Dysthymia [F34.1] 04/22/2015  . ADD (attention deficit disorder) [F90.9] 03/25/2015  . Anxiety [F41.9] 03/25/2015  . ADHD (attention deficit hyperactivity disorder), combined type [F90.2] 02/25/2015   Total Time spent with patient: 25 minutes  Past Psychiatric History: History of ADHD dysthymia no suicidal history  Past Medical History:  Past Medical History  Diagnosis Date  . ADHD (attention deficit hyperactivity disorder)     Past Surgical History  Procedure Laterality Date  . Cholecystectomy    . Colon surgery     Family History:  Family History  Problem Relation Age of Onset  . Diabetes Mother   . Alzheimer's disease Mother   . Depression Sister   . Hypertension Father    Family Psychiatric  History: Family history possible for anxiety Social History:  History  Alcohol Use  . Yes    Comment: special - occ     History  Drug Use No    Social History   Social History  . Marital Status: Married    Spouse Name: N/A  . Number of Children: N/A  . Years of Education: N/A   Social History Main Topics  . Smoking status: Former Smoker    Quit date: 08/19/2014  . Smokeless tobacco: Never Used  . Alcohol Use: Yes     Comment: special - occ  . Drug Use: No  . Sexual Activity: No   Other Topics Concern  . None   Social History Narrative   Additional Social  History:                         Sleep: Good  Appetite:  Good  Current Medications: Current Outpatient Prescriptions  Medication Sig Dispense Refill  . amphetamine-dextroamphetamine (ADDERALL) 20 MG tablet Take 1 tablet (20 mg total) by mouth 2 (two) times daily. 60 tablet 0  . ibuprofen (ADVIL,MOTRIN) 600 MG tablet Take 1 tablet (600 mg total) by mouth every 8 (eight) hours as needed for mild pain or moderate pain. 15 tablet 0  . Ibuprofen-Diphenhydramine HCl 200-25 MG CAPS Take by mouth.    . oxyCODONE-acetaminophen (ROXICET) 5-325 MG per tablet Take 1 tablet by mouth every 8 (eight) hours as needed for moderate pain or severe pain (Do not drive or operate heavy machinery while taking as can cause drowsiness.). 10 tablet 0   No current facility-administered medications for this visit.    Lab Results: No results found for this or any previous visit (from the past 48 hour(s)).  Physical Findings: AIMS:  , ,  ,  ,    CIWA:    COWS:     Musculoskeletal: Strength & Muscle Tone: within normal limits Gait & Station: normal Patient leans: N/A  Psychiatric Specialty Exam: ROS  Blood pressure 118/76, pulse 82, temperature 97.3 F (36.3 C), temperature source Tympanic, height  (1.778  m), weight 146 lb (66.225 kg), SpO2 95 %.Body mass index is 20.95 kg/(m^2).  General Appearance: Well Groomed  Patent attorneyye Contact::  Good  Speech:  Clear and Coherent  Volume:  Normal  Mood:  Euthymic  Affect:  Full Range  Thought Process:  Logical  Orientation:  Full (Time, Place, and Person)  Thought Content:  Negative  Suicidal Thoughts:  No  Homicidal Thoughts:  No  Memory:  Immediate;   Good Recent;   Good Remote;   Good  Judgement:  Good  Insight:  Good  Psychomotor Activity:  Normal  Concentration:  Good  Recall:  Good  Fund of Knowledge:Good  Language: Good  Akathisia:  No  Handed:  Right  AIMS (if indicated):     Assets:  Communication Skills Desire for  Improvement Financial Resources/Insurance Housing Leisure Time Physical Health Resilience Social Support Talents/Skills Transportation Vocational/Educational  ADL's:  Intact  Cognition: WNL  Sleep:      Treatment Plan Summary: Medication management and Plan supportive and educational counseling. Reviewed his year in general and all of his successes. Complemented his success at continuing to stay upbeat. Renewed medication for one more month follow-up in 4 weeks.  John Clapacs 08/26/2015, 2:12 PM

## 2015-09-28 ENCOUNTER — Ambulatory Visit (INDEPENDENT_AMBULATORY_CARE_PROVIDER_SITE_OTHER): Payer: 59 | Admitting: Psychiatry

## 2015-09-28 DIAGNOSIS — F902 Attention-deficit hyperactivity disorder, combined type: Secondary | ICD-10-CM | POA: Diagnosis not present

## 2015-09-28 MED ORDER — AMPHETAMINE-DEXTROAMPHETAMINE 20 MG PO TABS: 20.0000 mg | ORAL_TABLET | Freq: Two times a day (BID) | ORAL | Status: DC

## 2015-09-28 NOTE — Progress Notes (Signed)
[facility] MD Progress Note  09/28/2015 1:49 PM Vincent BaleRobert L [name]  MRN:  [id number] Subjective:  Follow-up for patient with ADHD and chronic anxiety. Patient has no new complaints. We reflect on how he has really enjoyed his last year. Mood is feeling good. Health is feeling good. Continues to have good focus and achievement at work. Continues to have good insight into his personal goals. No sign of any new problems. Tolerating medicine well. Principal Problem: @PPROB @ Diagnosis:   Patient Active Problem List   Diagnosis Date Noted  . Dysthymia [F34.1] 04/22/2015  . ADD (attention deficit disorder) [F90.9] 03/25/2015  . Anxiety [F41.9] 03/25/2015  . ADHD (attention deficit hyperactivity disorder), combined type [F90.2] 02/25/2015   Total Time spent with patient: 20 minutes  Past Psychiatric History: patient has a history of ADHD and anxiety and minor depression no suicidal behavior no hospitalization  Past Medical History:  Past Medical History  Diagnosis Date  . ADHD (attention deficit hyperactivity disorder)     Past Surgical History  Procedure Laterality Date  . Cholecystectomy    . Colon surgery     Family History:  Family History  Problem Relation Age of Onset  . Diabetes Mother   . Alzheimer's disease Mother   . Depression Sister   . Hypertension Father    Family Psychiatric  History: possible positive for some anxiety nothing else clear Social History:  History  Alcohol Use  . Yes    Comment: special - occ     History  Drug Use No    Social History   Social History  . Marital Status: Married    Spouse Name: N/A  . Number of Children: N/A  . Years of Education: N/A   Social History Main Topics  . Smoking status: Former Smoker    Quit date: 08/19/2014  . Smokeless tobacco: Never Used  . Alcohol Use: Yes     Comment: special - occ  . Drug Use: No  . Sexual Activity: No   Other Topics Concern  . Not on file   Social History Narrative   Additional Social  History:                         Sleep: Good  Appetite:  Good  Current Medications: Current Outpatient Prescriptions  Medication Sig Dispense Refill  . amphetamine-dextroamphetamine (ADDERALL) 20 MG tablet Take 1 tablet (20 mg total) by mouth 2 (two) times daily. 60 tablet 0  . ibuprofen (ADVIL,MOTRIN) 600 MG tablet Take 1 tablet (600 mg total) by mouth every 8 (eight) hours as needed for mild pain or moderate pain. 15 tablet 0  . Ibuprofen-Diphenhydramine HCl 200-25 MG CAPS Take by mouth.    . oxyCODONE-acetaminophen (ROXICET) 5-325 MG per tablet Take 1 tablet by mouth every 8 (eight) hours as needed for moderate pain or severe pain (Do not drive or operate heavy machinery while taking as can cause drowsiness.). 10 tablet 0   No current facility-administered medications for this visit.    Lab Results: No results found for this or any previous visit (from the past 48 hour(s)).  Physical Findings: AIMS:  , ,  ,  ,    CIWA:    COWS:     Musculoskeletal: Strength & Muscle Tone: within normal limits Gait & Station: normal Patient leans: N/A  Psychiatric Specialty Exam: ROS  There were no vitals taken for this visit.There is no weight on file to calculate BMI.  General Appearance:  Casual  Eye Contact::  Good  Speech:  Clear and Coherent  Volume:  Normal  Mood:  Euthymic  Affect:  Appropriate  Thought Process:  Coherent  Orientation:  Full (Time, Place, and Person)  Thought Content:  Negative  Suicidal Thoughts:  No  Homicidal Thoughts:  No  Memory:  Immediate;   Good Recent;   Good Remote;   Good  Judgement:  Intact  Insight:  Good  Psychomotor Activity:  Normal  Concentration:  Good  Recall:  Good  Fund of Knowledge:Good  Language: Good  Akathisia:  No  Handed:  Right  AIMS (if indicated):     Assets:  Communication Skills Desire for Improvement Financial Resources/Insurance Housing Leisure Time Physical Health Resilience Social  Support Talents/Skills Transportation Vocational/Educational  ADL's:  Intact  Cognition: WNL  Sleep:      Treatment Plan Summary: Medication management and Plan review current medicine. No side effects. Weight stable sleep stable no health problems. Tolerating medicine well. Continue current Adderall. Continue supportive therapy around his mood functioning. Follow-up in one month.  John Clapacs 09/28/2015, 1:49 PM

## 2015-10-28 ENCOUNTER — Encounter: Payer: Self-pay | Admitting: Psychiatry

## 2015-10-28 ENCOUNTER — Ambulatory Visit (INDEPENDENT_AMBULATORY_CARE_PROVIDER_SITE_OTHER): Payer: 59 | Admitting: Psychiatry

## 2015-10-28 VITALS — BP 118/82 | HR 84 | Temp 97.0°F | Ht 70.0 in | Wt 142.8 lb

## 2015-10-28 DIAGNOSIS — F341 Dysthymic disorder: Secondary | ICD-10-CM

## 2015-10-28 DIAGNOSIS — F902 Attention-deficit hyperactivity disorder, combined type: Secondary | ICD-10-CM

## 2015-10-28 MED ORDER — AMPHETAMINE-DEXTROAMPHETAMINE 20 MG PO TABS: 20.0000 mg | ORAL_TABLET | Freq: Two times a day (BID) | ORAL | Status: DC

## 2015-10-28 NOTE — Progress Notes (Signed)
[facility] MD Progress Note  10/28/2015 7:43 PM Vincent [name]  MRN:  [id number] Subjective:  Follow-up for patient with ADHD and dysthymia. Mood is been good. He has no new complaints. Mood is feeling good. Sleep is good. Focus and concentration are good. No side effects to medicine. Eating well. Vital signs are all stable. Enjoying his work. Principal Problem: @ Diagnosis:   Patient Active Problem List   Diagnosis Date Noted  . Dysthymia [F34.1] 04/22/2015  . ADD (attention deficit disorder) [F90.9] 03/25/2015  . Anxiety [F41.9] 03/25/2015  . ADHD (attention deficit hyperactivity disorder), combined type [F90.2] 02/25/2015   Total Time spent with patient: 20 minutes  Past Psychiatric History: Past history of ADHD and dysthymia well responsive to regular therapy and medication management  Past Medical History:  Past Medical History  Diagnosis Date  . ADHD (attention deficit hyperactivity disorder)     Past Surgical History  Procedure Laterality Date  . Cholecystectomy    . Colon surgery     Family History:  Family History  Problem Relation Age of Onset  . Diabetes Mother   . Alzheimer's disease Mother   . Depression Sister   . Hypertension Father    Family Psychiatric  History: None identified Social History:  History  Alcohol Use  . Yes    Comment: special - occ     History  Drug Use No    Social History   Social History  . Marital Status: Married    Spouse Name: N/A  . Number of Children: N/A  . Years of Education: N/A   Social History Main Topics  . Smoking status: Former Smoker    Quit date: 08/19/2014  . Smokeless tobacco: Never Used  . Alcohol Use: Yes     Comment: special - occ  . Drug Use: No  . Sexual Activity: No   Other Topics Concern  . None   Social History Narrative   Additional Social History:                         Sleep: Good  Appetite:  Good  Current Medications: Current Outpatient Prescriptions  Medication Sig  Dispense Refill  . amphetamine-dextroamphetamine (ADDERALL) 20 MG tablet Take 1 tablet (20 mg total) by mouth 2 (two) times daily. 60 tablet 0   No current facility-administered medications for this visit.    Lab Results: No results found for this or any previous visit (from the past 48 hour(s)).  Physical Findings: AIMS:  , ,  ,  ,    CIWA:    COWS:     Musculoskeletal: Strength & Muscle Tone: within normal limits Gait & Station: normal Patient leans: N/A  Psychiatric Specialty Exam: ROS  Blood pressure 118/82, pulse 84, temperature 97 F (36.1 C), temperature source Tympanic, height  (1.778 m), weight 142 lb 12.8 oz (64.774 kg), SpO2 96 %.Body mass index is 20.49 kg/(m^2).  General Appearance: Well Groomed  Patent attorney::  Good  Speech:  Normal Rate  Volume:  Normal  Mood:  Euthymic  Affect:  Appropriate  Thought Process:  Goal Directed  Orientation:  Full (Time, Place, and Person)  Thought Content:  Negative  Suicidal Thoughts:  No  Homicidal Thoughts:  No  Memory:  Immediate;   Good Recent;   Good Remote;   Good  Judgement:  Good  Insight:  Good  Psychomotor Activity:  Normal  Concentration:  Good  Recall:  Good  Fund of  Knowledge:Good  Language: Good  Akathisia:  No  Handed:  Right  AIMS (if indicated):     Assets:  Desire for Improvement Financial Resources/Insurance Housing Physical Health Social Support Talents/Skills  ADL's:  Intact  Cognition: WNL  Sleep:      Treatment Plan Summary: Medication management and Plan Continue Adderall 20 mg twice a day. Supportive therapy. Review of medication and side effects. Supportive and cognitive therapy around his mood and behavior. Return next month.  [name], MD 10/28/2015, 7:43 PM

## 2015-11-25 ENCOUNTER — Encounter: Payer: Self-pay | Admitting: Psychiatry

## 2015-11-25 ENCOUNTER — Ambulatory Visit (INDEPENDENT_AMBULATORY_CARE_PROVIDER_SITE_OTHER): Payer: 59 | Admitting: Psychiatry

## 2015-11-25 VITALS — BP 118/82 | HR 84 | Temp 97.2°F | Ht 70.0 in | Wt 144.4 lb

## 2015-11-25 DIAGNOSIS — F341 Dysthymic disorder: Secondary | ICD-10-CM

## 2015-11-25 DIAGNOSIS — F902 Attention-deficit hyperactivity disorder, combined type: Secondary | ICD-10-CM

## 2015-11-25 MED ORDER — AMPHETAMINE-DEXTROAMPHETAMINE 20 MG PO TABS: 20.0000 mg | ORAL_TABLET | Freq: Two times a day (BID) | ORAL | Status: DC

## 2015-11-25 NOTE — Progress Notes (Signed)
[facility] MD Progress Note  11/25/2015 2:38 PM Vincent [name]  MRN:  [id number] Subjective:  Follow-up 50year-old man with ADHD. He notices he's been having memory problems recently. Nothing dramatic but intermittent episodes of forgetfulness. No mood symptomsArea no depression. No suicidal ideation. No physical side effects noted. Sleeping well. Otherwise attention and focus good. Job stays very busy. Patient Active Problem List   Diagnosis Date Noted  . Dysthymia [F34.1] 04/22/2015  . ADD (attention deficit disorder) [F90.9] 03/25/2015  . Anxiety [F41.9] 03/25/2015  . ADHD (attention deficit hyperactivity disorder), combined type [F90.2] 02/25/2015   Total Time spent with patient: 20 minutes  Past Psychiatric History: History of ADHD responding well to current medicine. History of dysthymia and anxiety doing well with current therapy.  Past Medical History:  Past Medical History  Diagnosis Date  . ADHD (attention deficit hyperactivity disorder)     Past Surgical History  Procedure Laterality Date  . Cholecystectomy    . Colon surgery     Family History:  Family History  Problem Relation Age of Onset  . Diabetes Mother   . Alzheimer's disease Mother   . Depression Sister   . Hypertension Father    Family Psychiatric  History: None Social History:  History  Alcohol Use  . Yes    Comment: special - occ     History  Drug Use No    Social History   Social History  . Marital Status: Married    Spouse Name: N/A  . Number of Children: N/A  . Years of Education: N/A   Social History Main Topics  . Smoking status: Former Smoker    Quit date: 08/19/2014  . Smokeless tobacco: Never Used  . Alcohol Use: Yes     Comment: special - occ  . Drug Use: No  . Sexual Activity: No   Other Topics Concern  . None   Social History Narrative   Additional Social History:                         Sleep: Good  Appetite:  Good  Current Medications: Current Outpatient  Prescriptions  Medication Sig Dispense Refill  . amphetamine-dextroamphetamine (ADDERALL) 20 MG tablet Take 1 tablet (20 mg total) by mouth 2 (two) times daily. 60 tablet 0   No current facility-administered medications for this visit.    Lab Results: No results found for this or any previous visit (from the past 48 hour(s)).  Blood Alcohol level:  No results found for: [facility]  Physical Findings: AIMS:  , ,  ,  ,    CIWA:    COWS:     Musculoskeletal: Strength & Muscle Tone: within normal limits Gait & Station: normal Patient leans: N/A  Psychiatric Specialty Exam: ROS  Blood pressure 118/82, pulse 84, temperature 97.2 F (36.2 C), temperature source Tympanic, height  (1.778 m), weight 144 lb 6.4 oz (65.499 kg), SpO2 95 %.Body mass index is 20.72 kg/(m^2).  General Appearance: Fairly Groomed  Patent attorney::  Good  Speech:  Clear and Coherent  Volume:  Normal  Mood:  Euthymic  Affect:  Full Range  Thought Process:  Coherent  Orientation:  Full (Time, Place, and Person)  Thought Content:  Negative  Suicidal Thoughts:  No  Homicidal Thoughts:  No  Memory:  Immediate;   Good Recent;   Good Remote;   Good  Judgement:  Good  Insight:  Good  Psychomotor Activity:  Normal  Concentration:  Good  Recall:  Good  Fund of Knowledge:Good  Language: Good  Akathisia:  Negative  Handed:  Right  AIMS (if indicated):     Assets:  Communication Skills Desire for Improvement Financial Resources/Insurance Housing Leisure Time Physical Health Resilience Social Support  ADL's:  Intact  Cognition: WNL  Sleep:      Treatment Plan Summary: Medication management and Plan renew prescription for Adderall. Review medications. Supportive counseling. No other change to treatment plan. Follow-up next month. I reassured him that it sounds like his memory issues were likely in the normal range but we will keep an eye on it.  [name], MD 11/25/2015, 2:38 PM

## 2015-12-15 ENCOUNTER — Other Ambulatory Visit: Payer: Self-pay | Admitting: Psychiatry

## 2015-12-15 ENCOUNTER — Telehealth: Payer: Self-pay | Admitting: Psychiatry

## 2015-12-15 ENCOUNTER — Other Ambulatory Visit: Payer: Self-pay

## 2015-12-15 MED ORDER — AMPHETAMINE-DEXTROAMPHETAMINE 20 MG PO TABS: 20.0000 mg | ORAL_TABLET | Freq: Two times a day (BID) | ORAL | Status: DC

## 2015-12-15 NOTE — Telephone Encounter (Signed)
pt needs refill on adderall

## 2015-12-15 NOTE — Telephone Encounter (Signed)
I will present this prescription out and leave it on your desk. Thanks.

## 2015-12-16 ENCOUNTER — Ambulatory Visit
Admission: EM | Admit: 2015-12-16 | Discharge: 2015-12-16 | Disposition: A | Discharge status: Home / Self Care | Payer: 59 | Attending: Emergency Medicine | Admitting: Emergency Medicine

## 2015-12-16 ENCOUNTER — Encounter: Payer: Self-pay | Admitting: Emergency Medicine

## 2015-12-16 ENCOUNTER — Other Ambulatory Visit: Payer: Self-pay | Admitting: Psychiatry

## 2015-12-16 DIAGNOSIS — J069 Acute upper respiratory infection, unspecified: Secondary | ICD-10-CM

## 2015-12-16 DIAGNOSIS — J0100 Acute maxillary sinusitis, unspecified: Secondary | ICD-10-CM | POA: Diagnosis not present

## 2015-12-16 DIAGNOSIS — J45909 Unspecified asthma, uncomplicated: Secondary | ICD-10-CM

## 2015-12-16 HISTORY — DX: Unspecified asthma, uncomplicated: J45.909

## 2015-12-16 MED ORDER — AMPHETAMINE-DEXTROAMPHETAMINE 20 MG PO TABS: 20.0000 mg | ORAL_TABLET | Freq: Two times a day (BID) | ORAL | Status: DC

## 2015-12-16 MED ORDER — ALBUTEROL SULFATE HFA 108 (90 BASE) MCG/ACT IN AERS: 1.0000 | INHALATION_SPRAY | Freq: Four times a day (QID) | RESPIRATORY_TRACT | Status: DC | PRN

## 2015-12-16 MED ORDER — DOXYCYCLINE HYCLATE 100 MG PO CAPS: 100.0000 mg | ORAL_CAPSULE | Freq: Two times a day (BID) | ORAL | Status: DC

## 2015-12-16 MED ORDER — HYDROCOD POLST-CPM POLST ER 10-8 MG/5ML PO SUER: 5.0000 mL | Freq: Two times a day (BID) | ORAL | Status: DC | PRN

## 2015-12-16 MED ORDER — AEROCHAMBER PLUS MISC: Status: DC

## 2015-12-16 NOTE — Discharge Instructions (Signed)
2 puffs from your albuterol inhaler every 4-6 hours as needed for coughing, wheezing, chest tightness. Make sure you use the spacer with it. The cough may last for several weeks after using it better. This is normal. Stop the DayQuil, NyQuil, other cold medicines . Take the medication as written. Return if you get worse, have a fever >100.4, or for any concerns. You may take 800 mg of motrin with 1 gram of tylenol up to 3 times a day as needed for pain. This is an effective combination for pain.  Most sinus infections are viral and do not need antibiotics unless you have a high fever, have had this for 10 days, or you get better and then get sick again. Use a neti pot or the NeilMed sinus rinse as often as you want to to reduce nasal congestion. Follow the directions on the box.   Go to www.goodrx.com to look up your medications. This will give you a list of where you can find your prescriptions at the most affordable prices.

## 2015-12-16 NOTE — ED Notes (Signed)
Patient c/o sore throat that started on Friday and has improved.  Patient reports cough and chest congestion that started on Sunday.

## 2015-12-16 NOTE — Telephone Encounter (Signed)
rx ready for pick up adderall 20 mg id # [id number] order id # [id number]

## 2015-12-16 NOTE — ED Provider Notes (Signed)
HPI  SUBJECTIVE:  Vincent [name] is a 50 y.o. male who presents with sore throat, malaise, fatigue, maxillary sinus pain/pressure, rhinorrhea, nasal congestion, frontal headache, postnasal drip for 6 days. He reports a cough, runny chest pain, chest tightness, states he can't sleep at night secondary to the cough starting 3 days ago. Denies wheezing, other chest pain, shortness of breath. He has been taking Delsym, Chloraseptic, DayQuil, NyQuil. These helped temporarily. Symptoms are worse with lying down. No antipyretic in the past 4-6 hours. Past medical history of asthma, he is a former smoker. No history of diabetes, hypertension, emphysema, COPD. PMD: None    Past Medical History  Diagnosis Date  . ADHD (attention deficit hyperactivity disorder)   . Asthma     Past Surgical History  Procedure Laterality Date  . Cholecystectomy    . Colon surgery      Family History  Problem Relation Age of Onset  . Diabetes Mother   . Alzheimer's disease Mother   . Depression Sister   . Hypertension Father     Social History  Substance Use Topics  . Smoking status: Former Smoker    Quit date: 08/19/2014  . Smokeless tobacco: Never Used  . Alcohol Use: Yes     Comment: special - occ    No current facility-administered medications for this encounter.  Current outpatient prescriptions:  .  albuterol (PROVENTIL HFA;VENTOLIN HFA) 108 (90 Base) MCG/ACT inhaler, Inhale 1-2 puffs into the lungs every 6 (six) hours as needed for wheezing or shortness of breath., Disp: 1 Inhaler, Rfl: 0 .  amphetamine-dextroamphetamine (ADDERALL) 20 MG tablet, Take 1 tablet (20 mg total) by mouth 2 (two) times daily., Disp: 60 tablet, Rfl: 0 .  chlorpheniramine-HYDROcodone (TUSSIONEX PENNKINETIC ER) 10-8 MG/5ML SUER, Take 5 mLs by mouth every 12 (twelve) hours as needed for cough., Disp: 120 mL, Rfl: 0 .  doxycycline (VIBRAMYCIN) 100 MG capsule, Take 1 capsule (100 mg total) by mouth 2 (two) times daily. X 7  days, Disp: 14 capsule, Rfl: 0 .  Spacer/Aero-Holding Chambers (AEROCHAMBER PLUS) inhaler, Use as instructed, Disp: 1 each, Rfl: 2  No Known Allergies   ROS  As noted in HPI.   Physical Exam  BP 126/91 mmHg  Pulse 83  Temp(Src) 97 F (36.1 C) (Tympanic)  Resp 17  Ht 5\' 11"  (1.803 m)  Wt 145 lb (65.772 kg)  BMI 20.23 kg/m2  SpO2 100%  Constitutional: Well developed, well nourished, no acute distress Eyes:  EOMI, conjunctiva normal bilaterally HENT: Normocephalic, atraumatic,mucus membranes moist. TM's normal bilaterally. +  maxillary  sinus tenderness   normal turbinates +   watery nasal congestion. Oropharynx Erythematous  - tonsillar enlargement  - exudates.  uvula midline . +cobblestoning  +  PND Respiratory: Normal inspiratory effort. Fair air movement. Lungs clear b/l  Cardiovascular: Normal rate regular rhythm no murmurs rubs gallops GI: nondistended skin: No rash, skin intact Musculoskeletal: no deformities Neurologic: Alert & oriented x 3, no focal neuro deficits Psychiatric: Speech and behavior appropriate   ED Course   Medications - No data to display  No orders of the defined types were placed in this encounter.    No results found for this or any previous visit (from the past 24 hour(s)). No results found.  ED Clinical Impression  URI (upper respiratory infection)  Acute maxillary sinusitis, recurrence not specified  Reactive airway disease, unspecified asthma severity, uncomplicated   ED Assessment/Plan  Presentation is consistent with a URI, sinusitis is most likely  viral at this time, reactive airway disease. VS  normal. In the absence of wheezing, withholding steroids. No indications for x-rays at this time. Doubt pneumonia today Home with Mucinex D, albuterol inhaler, saline nasal irrigation, nasal steroid, cough syrup, wait-and-see prescription of doxycycline which will cover a sinusitis and pneumonia. Providing primary care  referral.  Discussed  MDM, plan and followup with patient.. Patient  agrees with plan.   *This clinic note was created using Dragon dictation software. Therefore, there may be occasional mistakes despite careful proofreading.  ?   Domenick Gong, MD 12/16/15 [phone]

## 2016-01-13 ENCOUNTER — Ambulatory Visit (INDEPENDENT_AMBULATORY_CARE_PROVIDER_SITE_OTHER): Payer: 59 | Admitting: Psychiatry

## 2016-01-13 ENCOUNTER — Encounter: Payer: Self-pay | Admitting: Psychiatry

## 2016-01-13 VITALS — BP 128/84 | HR 82 | Temp 97.5°F | Ht 71.0 in | Wt 137.2 lb

## 2016-01-13 DIAGNOSIS — F341 Dysthymic disorder: Secondary | ICD-10-CM | POA: Diagnosis not present

## 2016-01-13 DIAGNOSIS — F902 Attention-deficit hyperactivity disorder, combined type: Secondary | ICD-10-CM | POA: Diagnosis not present

## 2016-01-13 MED ORDER — AMPHETAMINE-DEXTROAMPHETAMINE 20 MG PO TABS: 20.0000 mg | ORAL_TABLET | Freq: Two times a day (BID) | ORAL | Status: DC

## 2016-02-17 ENCOUNTER — Other Ambulatory Visit: Payer: Self-pay

## 2016-02-17 ENCOUNTER — Ambulatory Visit (INDEPENDENT_AMBULATORY_CARE_PROVIDER_SITE_OTHER): Payer: 59 | Admitting: Psychiatry

## 2016-02-17 ENCOUNTER — Encounter: Payer: Self-pay | Admitting: Psychiatry

## 2016-02-17 VITALS — BP 122/68 | HR 83 | Temp 97.2°F | Ht 71.0 in | Wt 142.2 lb

## 2016-02-17 DIAGNOSIS — F341 Dysthymic disorder: Secondary | ICD-10-CM

## 2016-02-17 DIAGNOSIS — F902 Attention-deficit hyperactivity disorder, combined type: Secondary | ICD-10-CM | POA: Diagnosis not present

## 2016-02-17 MED ORDER — AMPHETAMINE-DEXTROAMPHETAMINE 20 MG PO TABS: 20.0000 mg | ORAL_TABLET | Freq: Two times a day (BID) | ORAL | Status: DC

## 2016-02-17 NOTE — Progress Notes (Signed)
BH MD/PA/NP OP Progress Note  02/17/2016 3:10 PM Vincent [name]  MRN:  [id number]  Chief Complaint:  Chief Complaint    Follow-up; Medication Refill     Subjective:  Follow-up for patient with ADHD and chronic anxiety and depression. This past month he has been feeling good. Mood is been good. He turned 50 and has some reflections on that. Continues to tolerate medicine well. Vital signs looking good. Eating well. No health problems. No sign of dangerousness no sign of thought disorder. HPI: Patient has history of ADHD responded well to Adderall. Anxiety and depression responded well to ongoing supportive and cognitive therapy. Visit Diagnosis:    ICD-9-CM ICD-10-CM   1. ADHD (attention deficit hyperactivity disorder), combined type 314.01 F90.2   2. Dysthymia 300.4 F34.1     Past Psychiatric History: No past psychiatric hospitalizations or dangerousness  Past Medical History:  Past Medical History  Diagnosis Date  . ADHD (attention deficit hyperactivity disorder)   . Asthma     Past Surgical History  Procedure Laterality Date  . Cholecystectomy    . Colon surgery      Family Psychiatric History: Negative  Family History:  Family History  Problem Relation Age of Onset  . Diabetes Mother   . Alzheimer's disease Mother   . Depression Sister   . Hypertension Father     Social History:  Social History   Social History  . Marital Status: Married    Spouse Name: N/A  . Number of Children: N/A  . Years of Education: N/A   Social History Main Topics  . Smoking status: Former Smoker    Quit date: 08/19/2014  . Smokeless tobacco: Never Used  . Alcohol Use: Yes     Comment: special - occ  . Drug Use: No  . Sexual Activity: No   Other Topics Concern  . None   Social History Narrative    Allergies: No Known Allergies  Metabolic Disorder Labs: No results found for: HGBA1C, MPG No results found for: PROLACTIN No results found for: CHOL, TRIG, HDL, CHOLHDL,  VLDL, LDLCALC   Current Medications: Current Outpatient Prescriptions  Medication Sig Dispense Refill  . amphetamine-dextroamphetamine (ADDERALL) 20 MG tablet Take 1 tablet (20 mg total) by mouth 2 (two) times daily. 60 tablet 0   No current facility-administered medications for this visit.    Neurologic: Headache: Negative Seizure: Negative Paresthesias: NA  Musculoskeletal: Strength & Muscle Tone: within normal limits Gait & Station: normal Patient leans: N/A  Psychiatric Specialty Exam: ROS  Blood pressure 122/68, pulse 83, temperature 97.2 F (36.2 C), temperature source Tympanic, height  (1.803 m), weight 142 lb 3.2 oz (64.501 kg), SpO2 94 %.Body mass index is 19.84 kg/(m^2).  General Appearance: Negative  Eye Contact:  Fair  Speech:  Clear and Coherent  Volume:  Normal  Mood:  Euthymic  Affect:  Appropriate  Thought Process:  Coherent  Orientation:  Full (Time, Place, and Person)  Thought Content: Negative   Suicidal Thoughts:  No  Homicidal Thoughts:  No  Memory:  Immediate;   Good Recent;   Fair Remote;   Good  Judgement:  Good  Insight:  Good  Psychomotor Activity:  Normal  Concentration:  Concentration: Good  Recall:  Good  Fund of Knowledge: Good  Language: Good  Akathisia:  No  Handed:  Right  AIMS (if indicated):  None   Assets:  Communication Skills Desire for Improvement Financial Resources/Insurance Housing Leisure Time Physical Health Resilience Social Support  ADL's:  Intact  Cognition: WNL  Sleep:  Sleeping well      Treatment Plan Summary:Medication management and Plan Continue Adderall. No change to dosage. Continue monthly supportive therapy reviewing treatment goals and progress. Follow-up next month.   Mordecai RasmussenJohn Clapacs, MD 02/17/2016, 3:10 PM

## 2016-03-20 ENCOUNTER — Ambulatory Visit (INDEPENDENT_AMBULATORY_CARE_PROVIDER_SITE_OTHER): Payer: 59 | Admitting: Psychiatry

## 2016-03-20 ENCOUNTER — Encounter: Payer: Self-pay | Admitting: Psychiatry

## 2016-03-20 VITALS — BP 118/76 | HR 97 | Temp 97.4°F | Ht 71.0 in | Wt 143.2 lb

## 2016-03-20 DIAGNOSIS — F902 Attention-deficit hyperactivity disorder, combined type: Secondary | ICD-10-CM

## 2016-03-20 MED ORDER — AMPHETAMINE-DEXTROAMPHETAMINE 20 MG PO TABS: 20.0000 mg | ORAL_TABLET | Freq: Two times a day (BID) | ORAL | Status: DC

## 2016-03-20 NOTE — Progress Notes (Signed)
BH MD/PA/NP OP Progress Note  03/20/2016 8:25 PM Vincent BaleRobert L [name]  MRN:  [id number]  Chief Complaint:  Chief Complaint    Follow-up; Medication Refill     Subjective:  Follow-up for 50 year old man with ADHD dysthymia. Patient has no new complaints. He has had a good and productive month. Discusses his relationship with his girlfriend. We discussed some of the emotional aspects of his work. Continues to get good benefit from medicine no sign of any side effects. HPI: History of ADHD that has responded well to current medicine. No side effects no physical difficulties. Mood and general functioning and well-being have improved dramatically over the past couple years. Visit Diagnosis:    ICD-9-CM ICD-10-CM   1. ADHD (attention deficit hyperactivity disorder), combined type 314.01 F90.2     Past Psychiatric History: No history of suicidal behavior no history of hospitalization.  Past Medical History:  Past Medical History  Diagnosis Date  . ADHD (attention deficit hyperactivity disorder)   . Asthma     Past Surgical History  Procedure Laterality Date  . Cholecystectomy    . Colon surgery      Family Psychiatric History: Family history of some anxiety possible ADHD  Family History:  Family History  Problem Relation Age of Onset  . Diabetes Mother   . Alzheimer's disease Mother   . Depression Sister   . Hypertension Father     Social History:  Social History   Social History  . Marital Status: Married    Spouse Name: N/A  . Number of Children: N/A  . Years of Education: N/A   Social History Main Topics  . Smoking status: Former Smoker    Quit date: 08/19/2014  . Smokeless tobacco: Never Used  . Alcohol Use: Yes     Comment: special - occ  . Drug Use: No  . Sexual Activity: No   Other Topics Concern  . None   Social History Narrative    Allergies: No Known Allergies  Metabolic Disorder Labs: No results found for: HGBA1C, MPG No results found for:  PROLACTIN No results found for: CHOL, TRIG, HDL, CHOLHDL, VLDL, LDLCALC   Current Medications: Current Outpatient Prescriptions  Medication Sig Dispense Refill  . amphetamine-dextroamphetamine (ADDERALL) 20 MG tablet Take 1 tablet (20 mg total) by mouth 2 (two) times daily. 60 tablet 0   No current facility-administered medications for this visit.    Neurologic: Headache: Negative Seizure: Negative Paresthesias: Negative  Musculoskeletal: Strength & Muscle Tone: within normal limits Gait & Station: normal Patient leans: N/A  Psychiatric Specialty Exam: ROS  Blood pressure 118/76, pulse 97, temperature 97.4 F (36.3 C), temperature source Tympanic, height 5\' 11"  (1.803 m), weight 143 lb 3.2 oz (64.955 kg), SpO2 95 %.Body mass index is 19.98 kg/(m^2).  General Appearance: Well Groomed  Eye Contact:  Good  Speech:  Clear and Coherent  Volume:  Normal  Mood:  Euthymic  Affect:  Appropriate  Thought Process:  Coherent  Orientation:  Full (Time, Place, and Person)  Thought Content: Logical   Suicidal Thoughts:  No  Homicidal Thoughts:  No  Memory:  Immediate;   Good Recent;   Good Remote;   Good  Judgement:  Good  Insight:  Good  Psychomotor Activity:  Normal  Concentration:  Concentration: Good  Recall:  Good  Fund of Knowledge: Good  Language: Good  Akathisia:  No  Handed:  Right  AIMS (if indicated):  No sign of tardive dyskinesia   Assets:  Communication Skills  Desire for Improvement Financial Resources/Insurance Housing Leisure Time Physical Health Resilience Social Support  ADL's:  Intact  Cognition: WNL  Sleep:  Normal      Treatment Plan Summary:Medication management and Plan Patient continues to do quite well. Refilled his prescription for Adderall. We discussed his job situation and emotional life. Supportive counseling. Review side effects and use of medicine. Follow-up 4 weeks.   Mordecai RasmussenJohn Clapacs, MD 03/20/2016, 8:25 PM

## 2016-04-14 ENCOUNTER — Telehealth (HOSPITAL_COMMUNITY): Payer: Self-pay

## 2016-04-14 NOTE — Telephone Encounter (Signed)
Spoke with him. He just wanted to make an appointment.

## 2016-04-17 ENCOUNTER — Encounter: Payer: Self-pay | Admitting: Psychiatry

## 2016-04-17 ENCOUNTER — Ambulatory Visit (INDEPENDENT_AMBULATORY_CARE_PROVIDER_SITE_OTHER): Payer: 59 | Admitting: Psychiatry

## 2016-04-17 VITALS — BP 126/83 | HR 83 | Temp 98.1°F | Ht 71.0 in | Wt 141.2 lb

## 2016-04-17 DIAGNOSIS — F902 Attention-deficit hyperactivity disorder, combined type: Secondary | ICD-10-CM

## 2016-04-17 MED ORDER — AMPHETAMINE-DEXTROAMPHETAMINE 20 MG PO TABS: 20.0000 mg | ORAL_TABLET | Freq: Two times a day (BID) | ORAL | 0 refills | Status: DC

## 2016-05-02 NOTE — Telephone Encounter (Signed)
appt was made

## 2016-05-16 ENCOUNTER — Ambulatory Visit (INDEPENDENT_AMBULATORY_CARE_PROVIDER_SITE_OTHER): Payer: 59 | Admitting: Psychiatry

## 2016-05-16 ENCOUNTER — Encounter: Payer: Self-pay | Admitting: Psychiatry

## 2016-05-16 VITALS — BP 154/89 | HR 84 | Temp 97.6°F | Ht 66.0 in | Wt 142.2 lb

## 2016-05-16 DIAGNOSIS — F341 Dysthymic disorder: Secondary | ICD-10-CM

## 2016-05-16 DIAGNOSIS — F902 Attention-deficit hyperactivity disorder, combined type: Secondary | ICD-10-CM | POA: Diagnosis not present

## 2016-05-16 MED ORDER — AMPHETAMINE-DEXTROAMPHETAMINE 20 MG PO TABS: 20.0000 mg | ORAL_TABLET | Freq: Two times a day (BID) | ORAL | 0 refills | Status: DC

## 2016-06-05 NOTE — Progress Notes (Signed)
Follow-up for 50 year old man with ADHD. No new complaints. Mood is been good. Concentration and focus remain good. No side effects from medicine. Tolerating medicine well. Overall functioning is going well.  Neatly dressed and groomed. Good eye contact. Normal psychomotor activity. Speech normal rate tone and volume. Affect euthymic.  No change to medicine. Renew Adderall. Follow-up one month.

## 2016-06-13 ENCOUNTER — Encounter: Payer: Self-pay | Admitting: Psychiatry

## 2016-06-13 ENCOUNTER — Ambulatory Visit (INDEPENDENT_AMBULATORY_CARE_PROVIDER_SITE_OTHER): Payer: 59 | Admitting: Psychiatry

## 2016-06-13 VITALS — BP 146/81 | HR 98 | Ht 66.0 in | Wt 139.6 lb

## 2016-06-13 DIAGNOSIS — F902 Attention-deficit hyperactivity disorder, combined type: Secondary | ICD-10-CM

## 2016-06-13 MED ORDER — AMPHETAMINE-DEXTROAMPHETAMINE 20 MG PO TABS: 20.0000 mg | ORAL_TABLET | Freq: Two times a day (BID) | ORAL | 0 refills | Status: DC

## 2016-06-13 NOTE — Progress Notes (Signed)
Follow-up for this 50 year old man with ADHD. No new complaints. Since last meeting he ended his contract and is currently out of work but feeling optimistic. Mood is good. Concentration and focus remain good. Positive social life. No side effects. No new medical problems.  Neatly dressed man very appropriate interaction. Affect upbeat. Thoughts lucid. No sign of psychosis. No suicidality. Judgment and insight intact cognitively intact.  Renew Adderall. Review medication plan. Follow-up one month.

## 2016-07-11 ENCOUNTER — Ambulatory Visit (INDEPENDENT_AMBULATORY_CARE_PROVIDER_SITE_OTHER): Payer: 59 | Admitting: Psychiatry

## 2016-07-11 ENCOUNTER — Encounter: Payer: Self-pay | Admitting: Psychiatry

## 2016-07-11 VITALS — BP 127/81 | HR 93 | Temp 97.7°F | Wt 139.6 lb

## 2016-07-11 DIAGNOSIS — F902 Attention-deficit hyperactivity disorder, combined type: Secondary | ICD-10-CM | POA: Diagnosis not present

## 2016-07-11 DIAGNOSIS — F341 Dysthymic disorder: Secondary | ICD-10-CM

## 2016-07-11 MED ORDER — AMPHETAMINE-DEXTROAMPHETAMINE 20 MG PO TABS: 20.0000 mg | ORAL_TABLET | Freq: Two times a day (BID) | ORAL | 0 refills | Status: DC

## 2016-07-11 NOTE — Progress Notes (Signed)
Follow-up for this patient with ADHD. It has been a very good month. He got a new job and is starting tomorrow. Very optimistic about it. Mood is good. Thoughts lucid. No complaints.  Neatly dressed and groomed good eye contact. Normal psychomotor activity. Speech normal rate tone and volume. Affect upbeat and appropriate. No sign of disorganized thinking.  Supportive counseling and review of plan. Renew medication. Follow-up one month.

## 2016-08-10 ENCOUNTER — Encounter: Payer: Self-pay | Admitting: Psychiatry

## 2016-08-10 ENCOUNTER — Ambulatory Visit (INDEPENDENT_AMBULATORY_CARE_PROVIDER_SITE_OTHER): Payer: 59 | Admitting: Psychiatry

## 2016-08-10 VITALS — BP 120/79 | HR 73 | Temp 97.6°F | Resp 15 | Wt 139.6 lb

## 2016-08-10 DIAGNOSIS — F902 Attention-deficit hyperactivity disorder, combined type: Secondary | ICD-10-CM

## 2016-08-10 DIAGNOSIS — F341 Dysthymic disorder: Secondary | ICD-10-CM | POA: Diagnosis not present

## 2016-08-10 MED ORDER — AMPHETAMINE-DEXTROAMPHETAMINE 20 MG PO TABS: 20.0000 mg | ORAL_TABLET | Freq: Two times a day (BID) | ORAL | 0 refills | Status: DC

## 2016-08-10 NOTE — Progress Notes (Signed)
Follow-up for patient with ADHD and anxiety. No new complaints. Functioning very well. New job. Moved to [place]. Mood is upbeat. Very positive. No sign of any inappropriate behavior. Tolerating medicine well.  Alert and oriented. Good eye contact. Normal speech. Affect upbeat. Thoughts lucid.  No change to medication plan. Review Adderall. Follow-up one month.

## 2016-09-07 ENCOUNTER — Ambulatory Visit (INDEPENDENT_AMBULATORY_CARE_PROVIDER_SITE_OTHER): Payer: 59 | Admitting: Psychiatry

## 2016-09-07 ENCOUNTER — Encounter: Payer: Self-pay | Admitting: Psychiatry

## 2016-09-07 VITALS — BP 124/87 | HR 77 | Temp 97.4°F | Wt 144.6 lb

## 2016-09-07 DIAGNOSIS — F341 Dysthymic disorder: Secondary | ICD-10-CM | POA: Diagnosis not present

## 2016-09-07 DIAGNOSIS — F902 Attention-deficit hyperactivity disorder, combined type: Secondary | ICD-10-CM

## 2016-09-07 MED ORDER — AMPHETAMINE-DEXTROAMPHETAMINE 20 MG PO TABS: 20.0000 mg | ORAL_TABLET | Freq: Two times a day (BID) | ORAL | 0 refills | Status: DC

## 2016-09-07 NOTE — Progress Notes (Signed)
Follow-up patient with ADHD. No new complaints. Focus and attention good. Mood good.  Neatly dressed and groomed. Good eye contact. Normal psychomotor activity. Normal speech. No sign of loosening of associations. No sign of misuse of medicine.  Supportive counseling and review of treatment. Refill Adderall. Follow-up one month.

## 2016-09-09 NOTE — Progress Notes (Signed)
Follow-up patient with ADHD. No new complaints. Focus and attention good. Tolerating medicine well.  Neatly dressed and groomed. Good eye contact. Speech normal. No sign of psychosis. Affect upbeat appropriate.  Supportive counseling and review of treatment plan. Continue ADHD medicine follow-up one month.

## 2016-10-26 ENCOUNTER — Other Ambulatory Visit: Payer: Self-pay | Admitting: Psychiatry

## 2016-10-26 ENCOUNTER — Other Ambulatory Visit: Payer: Self-pay

## 2016-10-26 MED ORDER — AMPHETAMINE-DEXTROAMPHETAMINE 20 MG PO TABS: 20.0000 mg | ORAL_TABLET | Freq: Two times a day (BID) | ORAL | 0 refills | Status: DC

## 2016-10-26 NOTE — Progress Notes (Signed)
refill 

## 2016-10-26 NOTE — Telephone Encounter (Signed)
pt called states he needs a rx for adderall.  pt states that he has changed jobs and that he has not gotten his insurance card yet so as soon as they get that straight he can come in for an appt.

## 2016-10-26 NOTE — Telephone Encounter (Signed)
Printed new script. [facility] it at [facility]

## 2016-10-30 NOTE — Telephone Encounter (Signed)
rx for adderall 20mg  ready for pick up id # [id number] order # [id number]

## 2017-01-18 ENCOUNTER — Encounter: Payer: Self-pay | Admitting: Psychiatry

## 2017-01-18 ENCOUNTER — Ambulatory Visit (INDEPENDENT_AMBULATORY_CARE_PROVIDER_SITE_OTHER): Payer: Self-pay | Admitting: Psychiatry

## 2017-01-18 VITALS — BP 142/89 | HR 80 | Temp 97.9°F | Wt 146.4 lb

## 2017-01-18 DIAGNOSIS — F902 Attention-deficit hyperactivity disorder, combined type: Secondary | ICD-10-CM

## 2017-03-29 ENCOUNTER — Encounter: Payer: Self-pay | Admitting: Psychiatry

## 2017-03-29 ENCOUNTER — Ambulatory Visit (INDEPENDENT_AMBULATORY_CARE_PROVIDER_SITE_OTHER): Payer: Self-pay | Admitting: Psychiatry

## 2017-03-29 VITALS — BP 133/87 | HR 76 | Temp 98.2°F | Wt 146.6 lb

## 2017-03-29 DIAGNOSIS — F902 Attention-deficit hyperactivity disorder, combined type: Secondary | ICD-10-CM

## 2017-03-30 MED ORDER — AMPHETAMINE-DEXTROAMPHETAMINE 20 MG PO TABS: 20.0000 mg | ORAL_TABLET | Freq: Two times a day (BID) | ORAL | 0 refills | Status: DC

## 2017-03-30 NOTE — Progress Notes (Signed)
Follow-up for this 51 year old man with ADHD. No new complaints. Doing quite well. Affect euthymic. Continues to get good benefit from his medicine. He knows that it helps him to stay focused and not be hyperactive and distracted. No side effects at all.  Neatly dressed and groomed. Good eye contact and normal psychomotor activity. Affect euthymic. Denies suicidal or homicidal thoughts. Functioning very well. Vitals stable.  Refill medicine for 2 months. Follow-up at that time.

## 2017-06-05 ENCOUNTER — Ambulatory Visit (INDEPENDENT_AMBULATORY_CARE_PROVIDER_SITE_OTHER): Payer: Self-pay | Admitting: Psychiatry

## 2017-06-05 ENCOUNTER — Encounter: Payer: Self-pay | Admitting: Psychiatry

## 2017-06-05 VITALS — BP 133/85 | HR 92 | Temp 98.1°F | Wt 148.8 lb

## 2017-06-05 DIAGNOSIS — F902 Attention-deficit hyperactivity disorder, combined type: Secondary | ICD-10-CM

## 2017-06-05 MED ORDER — AMPHETAMINE-DEXTROAMPHETAMINE 20 MG PO TABS: 20.0000 mg | ORAL_TABLET | Freq: Two times a day (BID) | ORAL | 0 refills | Status: DC

## 2017-06-05 NOTE — Progress Notes (Signed)
Follow-up for this 51 year old man with ADHD. No new complaints. He has had a lot of positive things last couple months that we discussed including deepening his relationship with a woman he met from [place].  Neatly dressed and groomed man looks his stated age. Appropriate interaction. Affect euthymic. Thoughts lucid. No sign of dangerousness. Overall good functioning. Tolerating medicine well without side effects.  Supportive counseling and therapy. Review medication side effects and usage. Refill the Adderall follow-up 2 months

## 2017-08-07 ENCOUNTER — Encounter: Payer: Self-pay | Admitting: Psychiatry

## 2017-08-07 ENCOUNTER — Ambulatory Visit: Payer: Self-pay | Admitting: Psychiatry

## 2017-08-07 VITALS — BP 122/76 | HR 75 | Ht 71.0 in | Wt 148.0 lb

## 2017-08-07 DIAGNOSIS — F341 Dysthymic disorder: Secondary | ICD-10-CM

## 2017-08-07 DIAGNOSIS — F902 Attention-deficit hyperactivity disorder, combined type: Secondary | ICD-10-CM

## 2017-08-07 NOTE — Progress Notes (Signed)
Follow-up 51 year old man with ADHD and dysthymia.  No new complaints.  Life is going pretty well.  He is actually getting engaged to a woman in [place] he has been in contact with.  He is going to visit her during the Christmas holidays.  Patient's affect is upbeat thoughts lucid and clear.  Work is going well.  No psychosis no agitation no physical complaints.  Supportive counseling review of treatment plan review medication use.  Renew Adderall prescriptions for the next 2 months.  Follow-up in 2 months.

## 2017-12-06 ENCOUNTER — Encounter: Payer: Self-pay | Admitting: Psychiatry

## 2017-12-06 ENCOUNTER — Other Ambulatory Visit: Payer: Self-pay

## 2017-12-06 ENCOUNTER — Ambulatory Visit: Payer: Self-pay | Admitting: Psychiatry

## 2017-12-06 VITALS — BP 123/79 | HR 69 | Temp 98.4°F | Wt 143.4 lb

## 2017-12-06 DIAGNOSIS — F902 Attention-deficit hyperactivity disorder, combined type: Secondary | ICD-10-CM

## 2018-01-08 ENCOUNTER — Other Ambulatory Visit: Payer: Self-pay

## 2018-01-08 ENCOUNTER — Encounter: Payer: Self-pay | Admitting: Psychiatry

## 2018-01-08 ENCOUNTER — Ambulatory Visit: Payer: Self-pay | Admitting: Psychiatry

## 2018-01-08 VITALS — BP 141/96 | HR 101 | Wt 141.2 lb

## 2018-01-08 DIAGNOSIS — F341 Dysthymic disorder: Secondary | ICD-10-CM

## 2018-01-08 DIAGNOSIS — F902 Attention-deficit hyperactivity disorder, combined type: Secondary | ICD-10-CM

## 2018-01-08 NOTE — Progress Notes (Signed)
Follow-up for this 52 year old man with ADHD and dysthymia.  Since I saw him last he has had some dramatic and positive things happen in his life.  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  This clears the way for her to come to [place] so they can get married soon.  Patient is elated about this.  As a result he has also searched out a new job so that he can have regular insurance.  Patient's upbeat mood thoughts lucid  Neatly dressed and groomed.  Good eye contact normal psychomotor activity.  Speech normal rate tone and volume.  Thoughts lucid.  No sign psychosis.  No sign of dangerousness.  Good benefit from his medicine which is well-tolerated.  Lots of support.  I just could not be happier for him.  Prescriptions rewritten for his Adderall follow-up 2 months.

## 2018-03-07 ENCOUNTER — Other Ambulatory Visit: Payer: Self-pay

## 2018-03-07 ENCOUNTER — Encounter: Payer: Self-pay | Admitting: Psychiatry

## 2018-03-07 ENCOUNTER — Ambulatory Visit: Payer: 59 | Admitting: Psychiatry

## 2018-03-07 VITALS — BP 117/85 | HR 71 | Temp 97.8°F | Wt 137.2 lb

## 2018-03-07 DIAGNOSIS — F341 Dysthymic disorder: Secondary | ICD-10-CM | POA: Diagnosis not present

## 2018-03-07 DIAGNOSIS — F902 Attention-deficit hyperactivity disorder, combined type: Secondary | ICD-10-CM | POA: Diagnosis not present

## 2018-03-07 MED ORDER — AMPHETAMINE-DEXTROAMPHETAMINE 20 MG PO TABS: 20.0000 mg | ORAL_TABLET | Freq: Two times a day (BID) | ORAL | 0 refills | Status: DC

## 2018-03-07 NOTE — Progress Notes (Signed)
Follow-up this 52 year old man with dysthymia and ADHD.  Patient has had a busy 2 months.  He has confirmed that his girlfriend will be able to emigrate to the [place] setting up a probable marriage this fall.  His new job is working out very well.  Overall mood is good.  He says he is been stressed out this past month but is finally feeling relaxed.  No major depression.  Overall functioning well.  No side effects of medicine.  Neatly dressed and groomed.  Good eye contact.  Normal affect normal eye contact.  Mood and affect appropriate.  Thoughts lucid no evidence of loosening of associations or delusions.  Continue current medications.  They were written out on paper at his request so I have just recorded them as no print.  Follow-up in 2 months.

## 2018-05-09 ENCOUNTER — Encounter: Payer: Self-pay | Admitting: Psychiatry

## 2018-05-09 ENCOUNTER — Ambulatory Visit (INDEPENDENT_AMBULATORY_CARE_PROVIDER_SITE_OTHER): Payer: 59 | Admitting: Psychiatry

## 2018-05-09 ENCOUNTER — Other Ambulatory Visit: Payer: Self-pay

## 2018-05-09 VITALS — BP 117/82 | HR 89 | Temp 97.9°F

## 2018-05-09 DIAGNOSIS — F902 Attention-deficit hyperactivity disorder, combined type: Secondary | ICD-10-CM

## 2018-05-09 DIAGNOSIS — F341 Dysthymic disorder: Secondary | ICD-10-CM | POA: Diagnosis not present

## 2018-05-09 NOTE — Progress Notes (Signed)
Follow-up for 52 year old man with ADHD.  He has been out of his medicines for 2 days and you can really see it.  He is pacing around the room more irritable less organized.  He has been under a lot of stress and is particularly worried because he is still going forward with the plan to have his fiance moved here for the wedding by the end of the year.  At the same time he is probably going to have to change jobs this month.  Neatly dressed and groomed.  Actually seems a little more grumpy and irritable than usual which she acknowledges.  Thoughts lucid no sign of loosening of associations or delusions.  Affect euthymic.  No sign of psychosis.  Good judgment and insight.  Continue stimulant medication.  Supportive counseling around the many stresses in his life.  Expect to follow up in probably about 2 months.  I actually wrote his prescriptions out on paper this time because he may have a gap in his insurance.  Probably [id number]-month follow-up.

## 2018-07-04 ENCOUNTER — Ambulatory Visit: Payer: 59 | Admitting: Psychiatry

## 2018-07-04 ENCOUNTER — Encounter: Payer: Self-pay | Admitting: Psychiatry

## 2018-07-04 ENCOUNTER — Other Ambulatory Visit: Payer: Self-pay

## 2018-07-04 VITALS — BP 130/85 | HR 88 | Temp 97.4°F | Wt 133.0 lb

## 2018-07-04 DIAGNOSIS — F902 Attention-deficit hyperactivity disorder, combined type: Secondary | ICD-10-CM

## 2018-07-04 DIAGNOSIS — F341 Dysthymic disorder: Secondary | ICD-10-CM

## 2018-07-04 MED ORDER — AMPHETAMINE-DEXTROAMPHET ER 20 MG PO CP24: 40.0000 mg | ORAL_CAPSULE | Freq: Every day | ORAL | 0 refills | Status: DC

## 2018-07-04 NOTE — Progress Notes (Signed)
Follow-up for this patient with ADHD.  Since our last meeting on the good side his work is going great.  He is very pleased to now have a permanent position giving him full insurance benefits and he enjoys his work.  On the negative side his wedding is being held up because his wife's mother has so far been denied a visa to come to the [place] from [place].  Overall he is doing fine emotionally although he has appropriate reactions to all of this.  Sleeping well.  Eating well.  No sign of any mood symptoms no agitation.  Neatly dressed and groomed good eye contact normal psychomotor activity.  Appropriate affect reaction.  Thoughts lucid no loosening of associations no delusions.  Supportive counseling and encouragement.  He is planning to employ a immigration lawyer and is optimistic that things will work out although it will delay the wedding probably by a month or more.  Meanwhile he now has insurance and would like to switch back to the extended release Adderall which is fine with me.  Prescription for 40 mg total of extended release Adderall per day 1 month supply of see him back in 4 weeks.

## 2018-08-15 ENCOUNTER — Other Ambulatory Visit: Payer: Self-pay

## 2018-08-15 ENCOUNTER — Ambulatory Visit: Payer: 59 | Admitting: Psychiatry

## 2018-08-15 ENCOUNTER — Encounter: Payer: Self-pay | Admitting: Psychiatry

## 2018-08-15 VITALS — BP 118/82 | HR 77 | Temp 98.3°F | Wt 131.2 lb

## 2018-08-15 DIAGNOSIS — F902 Attention-deficit hyperactivity disorder, combined type: Secondary | ICD-10-CM | POA: Diagnosis not present

## 2018-08-15 DIAGNOSIS — F341 Dysthymic disorder: Secondary | ICD-10-CM

## 2018-08-15 MED ORDER — AMPHETAMINE-DEXTROAMPHETAMINE 20 MG PO TABS: 20.0000 mg | ORAL_TABLET | Freq: Two times a day (BID) | ORAL | 0 refills | Status: DC

## 2018-08-15 NOTE — Progress Notes (Signed)
Patient with ADHD and dysthymia.  He has had quite a set back since last month.  His difficulty getting his fiance to move from [place] has gotten worse and he does not know when it will happen.  Despite this he is holding on and not getting depressed.  Tries to keep himself active and upbeat.  Patient is neatly dressed and groomed upbeat talkative as usual.  No thoughts of suicide no psychotic symptoms.  Continues to tolerate medication well and get a lot of benefit from it as far as hyperactivity  No change to medicine except that he could not get the extended release filled properly so we will go back to the plain dose 20 mg twice a day.  Follow-up in a month.  Supportive therapy and counseling completed.

## 2018-09-12 ENCOUNTER — Other Ambulatory Visit: Payer: Self-pay

## 2018-09-12 ENCOUNTER — Encounter: Payer: Self-pay | Admitting: Psychiatry

## 2018-09-12 ENCOUNTER — Ambulatory Visit: Payer: 59 | Admitting: Psychiatry

## 2018-09-12 VITALS — BP 147/83 | HR 81 | Temp 97.8°F | Wt 132.0 lb

## 2018-09-12 DIAGNOSIS — F341 Dysthymic disorder: Secondary | ICD-10-CM

## 2018-09-12 DIAGNOSIS — F902 Attention-deficit hyperactivity disorder, combined type: Secondary | ICD-10-CM | POA: Diagnosis not present

## 2018-09-12 NOTE — Progress Notes (Signed)
Patient is doing well.  Had a good month.  Work is going well.  Mood upbeat.  Concentration and focus good.  Neatly dressed.  Appropriate interaction.  Mood affect and interaction all totally normal.  Supportive counseling review of treatment plan continue medicine follow-up 1 month

## 2018-10-15 ENCOUNTER — Encounter: Payer: Self-pay | Admitting: Psychiatry

## 2018-10-15 ENCOUNTER — Other Ambulatory Visit: Payer: Self-pay

## 2018-10-15 ENCOUNTER — Ambulatory Visit: Payer: 59 | Admitting: Psychiatry

## 2018-10-15 VITALS — BP 135/78 | HR 79 | Temp 97.4°F | Wt 136.8 lb

## 2018-10-15 DIAGNOSIS — F902 Attention-deficit hyperactivity disorder, combined type: Secondary | ICD-10-CM | POA: Diagnosis not present

## 2018-10-15 DIAGNOSIS — F341 Dysthymic disorder: Secondary | ICD-10-CM

## 2018-10-15 NOTE — Progress Notes (Signed)
Follow-up for patient with ADHD.  He is doing well.  The last month has been busy but productive.  No sign of any problems with medication overuse.  No problems with appetite or sleep.  Concentration and focus are good with good function at work.  In interview the patient is calm appropriate very focused but with appropriate affect.  No sign of any thought disorder.  Lucid with good judgment and insight.  No need to change any prescriptions.  The only exception is that because of some insurance problems we will return for now to the short acting medicine which he does not like quite as much but is adequate.  We will follow-up next month.

## 2018-11-14 ENCOUNTER — Ambulatory Visit: Payer: 59 | Admitting: Psychiatry

## 2018-11-14 ENCOUNTER — Encounter: Payer: Self-pay | Admitting: Psychiatry

## 2018-11-14 DIAGNOSIS — F902 Attention-deficit hyperactivity disorder, combined type: Secondary | ICD-10-CM | POA: Diagnosis not present

## 2018-11-14 DIAGNOSIS — F341 Dysthymic disorder: Secondary | ICD-10-CM

## 2018-11-14 MED ORDER — AMPHETAMINE-DEXTROAMPHET ER 20 MG PO CP24: 40.0000 mg | ORAL_CAPSULE | Freq: Every day | ORAL | 0 refills | Status: DC

## 2018-11-14 NOTE — Progress Notes (Signed)
Follow-up for this gentleman with ADHD and dysthymia and chronic anxiety.  Patient reports that the last month has been somewhat frustrating.  His fianc remains stranded in [place] which has just been made worse by the current health situation.  He has been working very hard.  Nevertheless not depressed.  Appropriate coping skills.  Attention and focus good.  No mood instability.  Neatly dressed and groomed patient good eye contact normal psychomotor activity speech normal rate tone and volume affect euthymic.  No evidence suicidality homicidality or dangerousness.  No sign of any misuse of medicine.  Renew Adderall.  Follow-up 1 month

## 2018-12-10 ENCOUNTER — Ambulatory Visit: Payer: 59 | Admitting: Psychiatry

## 2018-12-10 ENCOUNTER — Other Ambulatory Visit: Payer: Self-pay

## 2018-12-10 ENCOUNTER — Encounter: Payer: Self-pay | Admitting: Psychiatry

## 2018-12-10 VITALS — BP 130/81 | HR 88 | Temp 98.2°F | Wt 130.8 lb

## 2018-12-10 DIAGNOSIS — F341 Dysthymic disorder: Secondary | ICD-10-CM | POA: Diagnosis not present

## 2018-12-10 DIAGNOSIS — F902 Attention-deficit hyperactivity disorder, combined type: Secondary | ICD-10-CM

## 2018-12-11 ENCOUNTER — Encounter: Payer: Self-pay | Admitting: Psychiatry

## 2018-12-11 NOTE — Progress Notes (Signed)
Follow-up for this gentleman with ADHD.  Patient has no complaints.  He is working from home because of the current restrictions.  Sleeping well.  Concentrating fine.  Getting work done.  Staying in contact with his fiance in [place] despite all the problems of the international situation.  Upbeat and optimistic.  No side effects of medicine.  Neatly dressed and groomed.  Good eye contact and normal psychomotor activity.  Speech normal rate tone and volume affect euthymic.  No psychosis no suicidal ideation.  Review medication.  Supportive therapy.  Encourage in his self-care.  Follow-up in another month or 2 depending on what the quarantine circumstances are

## 2018-12-12 ENCOUNTER — Ambulatory Visit: Payer: 59 | Admitting: Psychiatry

## 2019-02-11 ENCOUNTER — Encounter: Payer: Self-pay | Admitting: Psychiatry

## 2019-02-11 ENCOUNTER — Other Ambulatory Visit: Payer: Self-pay

## 2019-02-11 ENCOUNTER — Ambulatory Visit (INDEPENDENT_AMBULATORY_CARE_PROVIDER_SITE_OTHER): Payer: 59 | Admitting: Psychiatry

## 2019-02-11 DIAGNOSIS — F902 Attention-deficit hyperactivity disorder, combined type: Secondary | ICD-10-CM | POA: Diagnosis not present

## 2019-02-11 MED ORDER — AMPHETAMINE-DEXTROAMPHETAMINE 20 MG PO TABS: 20.0000 mg | ORAL_TABLET | Freq: Two times a day (BID) | ORAL | 0 refills | Status: DC

## 2019-02-11 NOTE — Progress Notes (Signed)
Telephone appointment for this 53 year old gentleman with ADHD.  Spoke with patient by telephone.  He has no new complaints.  Mood has been good.  No spells of depression.  He is working from home and feels like he is equally productive as he would be in the office.  Has appropriate concerns about some social isolation but is doing his best to manage it.  Sleeping fine and eating fine.  No medical concerns.  Patient is alert and oriented appropriate in his interaction.  Affect appears to be appropriate and reactive.  Thoughts lucid no sign of loosening of associations or delusions.  Patient continues to tolerate and benefit from Adderall.  Prescriptions refilled at his pharmacy.  We will tentatively plan to meet again in another 2 months.

## 2019-03-13 ENCOUNTER — Other Ambulatory Visit: Payer: Self-pay

## 2019-03-13 ENCOUNTER — Encounter: Payer: Self-pay | Admitting: Psychiatry

## 2019-03-13 ENCOUNTER — Ambulatory Visit (INDEPENDENT_AMBULATORY_CARE_PROVIDER_SITE_OTHER): Payer: 59 | Admitting: Psychiatry

## 2019-03-13 DIAGNOSIS — F902 Attention-deficit hyperactivity disorder, combined type: Secondary | ICD-10-CM

## 2019-03-13 MED ORDER — AMPHETAMINE-DEXTROAMPHETAMINE 20 MG PO TABS: 20.0000 mg | ORAL_TABLET | Freq: Two times a day (BID) | ORAL | 0 refills | Status: DC

## 2019-03-13 MED ORDER — AMPHETAMINE-DEXTROAMPHET ER 20 MG PO CP24: 40.0000 mg | ORAL_CAPSULE | Freq: Every day | ORAL | 0 refills | Status: DC

## 2019-03-13 NOTE — Progress Notes (Signed)
Follow-up for patient with ADHD.  Patient was on time for the interview very appropriate as usual.  No new complaints.  We talked about his stressful situation still isolating at home and working from home.  No complaints however of depression.  Feels content with his work.  Sleeping adequately health good.  Appropriate thought content and lucidity.  Appropriate affect.  Alert and oriented.  No complaints no sign of dangerousness.  Supportive therapy and counseling review of medications follow-up in another month or 2 as the situation allows.

## 2019-04-08 ENCOUNTER — Ambulatory Visit (INDEPENDENT_AMBULATORY_CARE_PROVIDER_SITE_OTHER): Payer: 59 | Admitting: Psychiatry

## 2019-04-08 ENCOUNTER — Other Ambulatory Visit: Payer: Self-pay

## 2019-04-08 ENCOUNTER — Encounter: Payer: Self-pay | Admitting: Psychiatry

## 2019-04-08 DIAGNOSIS — F341 Dysthymic disorder: Secondary | ICD-10-CM | POA: Diagnosis not present

## 2019-04-08 DIAGNOSIS — F902 Attention-deficit hyperactivity disorder, combined type: Secondary | ICD-10-CM

## 2019-04-08 MED ORDER — AMPHETAMINE-DEXTROAMPHETAMINE 20 MG PO TABS: 20.0000 mg | ORAL_TABLET | Freq: Two times a day (BID) | ORAL | 0 refills | Status: DC

## 2019-04-08 NOTE — Progress Notes (Signed)
Follow-up patient with ADHD.  Patient has no new complaints.  The last month is been going well.  Having a lot of success at work.  Functioning well.  Mood is been good.  Feeling able to tolerate with good patience waiting for his fiance.  Medicine appears to continue to work.  Sleeping and eating fine.  Lucid conversation.  Normal affect.  Clear thoughts no sign of psychotic thinking denies suicidal or homicidal ideation.  Review of treatment review of medicine and supportive counseling.  Refill medicine follow-up in a month.

## 2019-04-15 ENCOUNTER — Ambulatory Visit: Payer: 59 | Admitting: Psychiatry

## 2019-05-08 ENCOUNTER — Ambulatory Visit (INDEPENDENT_AMBULATORY_CARE_PROVIDER_SITE_OTHER): Payer: 59 | Admitting: Psychiatry

## 2019-05-08 ENCOUNTER — Other Ambulatory Visit: Payer: Self-pay

## 2019-05-08 DIAGNOSIS — F902 Attention-deficit hyperactivity disorder, combined type: Secondary | ICD-10-CM

## 2019-05-08 DIAGNOSIS — F341 Dysthymic disorder: Secondary | ICD-10-CM

## 2019-05-08 DIAGNOSIS — F909 Attention-deficit hyperactivity disorder, unspecified type: Secondary | ICD-10-CM

## 2019-05-08 MED ORDER — AMPHETAMINE-DEXTROAMPHETAMINE 20 MG PO TABS: 20.0000 mg | ORAL_TABLET | Freq: Two times a day (BID) | ORAL | 0 refills | Status: DC

## 2019-05-09 ENCOUNTER — Encounter: Payer: Self-pay | Admitting: Psychiatry

## 2019-05-09 NOTE — Progress Notes (Signed)
Follow-up for this patient with ADHD.  Continues to work entirely from home.  This month is been a little harder.  Narrowly missed being laid off despite how hard he is working.  He sounds a little more anxious and tense but certainly not in any dangerous or concerning degree.  Still functioning well with clear thinking and good focus and attention.  Spoke to patient on the telephone.  Voice is appropriate affect appropriate thoughts lucid and clear no sign of dangerousness.  Supportive counseling and therapy.  No change to doses of medicine renewed Adderall.  Talk to him again next month.

## 2019-06-03 ENCOUNTER — Ambulatory Visit (INDEPENDENT_AMBULATORY_CARE_PROVIDER_SITE_OTHER): Payer: 59 | Admitting: Psychiatry

## 2019-06-03 ENCOUNTER — Other Ambulatory Visit: Payer: Self-pay

## 2019-06-03 ENCOUNTER — Encounter: Payer: Self-pay | Admitting: Psychiatry

## 2019-06-03 DIAGNOSIS — F341 Dysthymic disorder: Secondary | ICD-10-CM | POA: Diagnosis not present

## 2019-06-03 DIAGNOSIS — F902 Attention-deficit hyperactivity disorder, combined type: Secondary | ICD-10-CM

## 2019-06-03 MED ORDER — AMPHETAMINE-DEXTROAMPHET ER 20 MG PO CP24: 40.0000 mg | ORAL_CAPSULE | Freq: Every day | ORAL | 0 refills | Status: DC

## 2019-06-03 NOTE — Progress Notes (Signed)
Follow-up for this gentleman with ADHD chronic anxiety.  Reached him on the telephone.  Very cooperative and appropriate as always.  Patient says the last month has felt more stressful to him.  His work is very busy but even more than that he is starting to feel worn out by the isolation of the pandemic situation.  Nervous more.  Nothing impairing yet.  Not depressed.  Not panicking.  Sleeping adequately.  Still manages to keep a reasonable social contact.  Alert and oriented appropriate with appropriate and reactive affect.  Thoughts lucid.  No sign of any dangerousness.  No sign of any medicine problems.  Supportive counseling and therapy.  Validated his idea that isolation and the pandemic have been very psychologically stressful.  Emphasized with the trouble he is having communicating with his father.  No indication for any change to medicine.  Everything is refilled at this point.  We will check up again in another month.

## 2019-07-01 ENCOUNTER — Encounter: Payer: Self-pay | Admitting: Psychiatry

## 2019-07-01 ENCOUNTER — Ambulatory Visit (INDEPENDENT_AMBULATORY_CARE_PROVIDER_SITE_OTHER): Payer: 59 | Admitting: Psychiatry

## 2019-07-01 ENCOUNTER — Other Ambulatory Visit: Payer: Self-pay

## 2019-07-01 DIAGNOSIS — F341 Dysthymic disorder: Secondary | ICD-10-CM | POA: Diagnosis not present

## 2019-07-01 DIAGNOSIS — F902 Attention-deficit hyperactivity disorder, combined type: Secondary | ICD-10-CM | POA: Diagnosis not present

## 2019-07-01 MED ORDER — AMPHETAMINE-DEXTROAMPHET ER 20 MG PO CP24: 40.0000 mg | ORAL_CAPSULE | Freq: Every day | ORAL | 0 refills | Status: DC

## 2019-07-01 NOTE — Progress Notes (Signed)
Follow-up for this patient with ADHD.  Spoke to patient by telephone.  Patient had no new complaints.  Reviewed his month multiple stresses having to move into a new apartment soon.  Mood however remains good.  Focus and attention good.  No new medical issues.  Alert and oriented.  Appropriate interaction.  Thoughtful intelligent lucid commentary.  No suicidal or homicidal ideation or sign of psychosis.  Review medication.  Supportive therapy.  Follow-up 4 weeks

## 2019-07-03 ENCOUNTER — Telehealth: Payer: Self-pay

## 2019-07-03 NOTE — Telephone Encounter (Signed)
received notice that a prior auth was needed for adderall xr.  PA was submitted and approved from  07-02-19 to  07-01-20

## 2019-07-08 ENCOUNTER — Other Ambulatory Visit: Payer: Self-pay

## 2019-07-08 DIAGNOSIS — Z20822 Contact with and (suspected) exposure to covid-19: Secondary | ICD-10-CM

## 2019-07-10 LAB — NOVEL CORONAVIRUS, NAA: SARS-CoV-2, NAA: NOT DETECTED

## 2019-07-17 ENCOUNTER — Ambulatory Visit (INDEPENDENT_AMBULATORY_CARE_PROVIDER_SITE_OTHER): Payer: 59

## 2019-07-17 ENCOUNTER — Other Ambulatory Visit: Payer: Self-pay

## 2019-07-17 ENCOUNTER — Encounter: Payer: Self-pay | Admitting: Emergency Medicine

## 2019-07-17 ENCOUNTER — Ambulatory Visit
Admission: EM | Admit: 2019-07-17 | Discharge: 2019-07-17 | Disposition: A | Discharge status: Home / Self Care | Payer: 59 | Attending: Family Medicine | Admitting: Family Medicine

## 2019-07-17 DIAGNOSIS — R05 Cough: Secondary | ICD-10-CM

## 2019-07-17 DIAGNOSIS — R0602 Shortness of breath: Secondary | ICD-10-CM

## 2019-07-17 DIAGNOSIS — J40 Bronchitis, not specified as acute or chronic: Secondary | ICD-10-CM

## 2019-07-17 MED ORDER — PREDNISONE 50 MG PO TABS: ORAL_TABLET | ORAL | 0 refills | Status: DC

## 2019-07-17 MED ORDER — ALBUTEROL SULFATE HFA 108 (90 BASE) MCG/ACT IN AERS: 1.0000 | INHALATION_SPRAY | Freq: Four times a day (QID) | RESPIRATORY_TRACT | 0 refills | Status: DC | PRN

## 2019-07-17 NOTE — ED Provider Notes (Signed)
[facility]    CSN: [id number] Arrival date & time: 07/17/19  0945  History   Chief Complaint Chief Complaint  Patient presents with  . Cough  . Shortness of Breath   HPI   53 year old male presents with the above complaints.  Patient reports that he has had a cough and chest congestion for the past week.  Went to the drive-through for Covid test and it was negative.  Patient continues to have cough which is quite bothersome for him.  He reports mild shortness of breath.  No fever.  No medications or interventions tried.  No known exacerbating or relieving factors.  Patient continues to smoke intermittently.  No other associated symptoms.  No other complaints.  PMH, Surgical Hx, Family Hx, Social History reviewed and updated as below.  Past Medical History:  Diagnosis Date  . ADHD (attention deficit hyperactivity disorder)   . Asthma    Patient Active Problem List   Diagnosis Date Noted  . Dysthymia 04/22/2015  . ADD (attention deficit disorder) 03/25/2015  . Anxiety 03/25/2015  . ADHD (attention deficit hyperactivity disorder), combined type 02/25/2015   Past Surgical History:  Procedure Laterality Date  . CHOLECYSTECTOMY    . COLON SURGERY     Home Medications    Prior to Admission medications   Medication Sig Start Date End Date Taking? Authorizing Provider  amphetamine-dextroamphetamine (ADDERALL XR) 20 MG 24 hr capsule Take 2 capsules (40 mg total) by mouth daily. 07/01/19  Yes Clapacs, Jackquline Denmark, MD  albuterol (VENTOLIN HFA) 108 (90 Base) MCG/ACT inhaler Inhale 1-2 puffs into the lungs every 6 (six) hours as needed for wheezing or shortness of breath. 07/17/19   Tommie Sams, DO  amphetamine-dextroamphetamine (ADDERALL) 20 MG tablet Take 1 tablet (20 mg total) by mouth 2 (two) times daily. 04/08/19   Clapacs, Jackquline Denmark, MD  amphetamine-dextroamphetamine (ADDERALL) 20 MG tablet Take 1 tablet (20 mg total) by mouth 2 (two) times daily with a meal. 05/08/19    Clapacs, Jackquline Denmark, MD  predniSONE (DELTASONE) 50 MG tablet 1 tablet daily x 5 days 07/17/19   Tommie Sams, DO    Family History Family History  Problem Relation Age of Onset  . Diabetes Mother   . Alzheimer's disease Mother   . Depression Sister   . Hypertension Father     Social History Social History   Tobacco Use  . Smoking status: Former Smoker    Quit date: 08/19/2014    Years since quitting: 4.9  . Smokeless tobacco: Never Used  Substance Use Topics  . Alcohol use: Yes    Comment: special - occ  . Drug use: No     Allergies   Patient has no known allergies.   Review of Systems Review of Systems  Constitutional: Negative for fever.  Respiratory: Positive for cough and shortness of breath.    Physical Exam Triage Vital Signs ED Triage Vitals  Enc Vitals Group     BP 07/17/19 1011 130/89     Pulse Rate 07/17/19 1011 72     Resp 07/17/19 1011 18     Temp 07/17/19 1011 98.2 F (36.8 C)     Temp Source 07/17/19 1011 Oral     SpO2 07/17/19 1011 100 %     Weight 07/17/19 1010 145 lb (65.8 kg)     Height 07/17/19 1010 5\' 10"  (1.778 m)     Head Circumference --      Peak Flow --  Pain Score 07/17/19 1008 3     Pain Loc --      Pain Edu? --      Excl. in [place]? --    Updated Vital Signs BP 130/89 (BP Location: Right Arm)   Pulse 72   Temp 98.2 F (36.8 C) (Oral)   Resp 18   Ht 5\' 10"  (1.778 m)   Wt 65.8 kg   SpO2 100%   BMI 20.81 kg/m   Visual Acuity Right Eye Distance:   Left Eye Distance:   Bilateral Distance:    Right Eye Near:   Left Eye Near:    Bilateral Near:     Physical Exam Vitals signs and nursing note reviewed.  Constitutional:      General: He is not in acute distress.    Appearance: Normal appearance. He is not ill-appearing.  HENT:     Head: Normocephalic and atraumatic.  Eyes:     General:        Right eye: No discharge.        Left eye: No discharge.     Conjunctiva/sclera: Conjunctivae normal.  Cardiovascular:      Rate and Rhythm: Normal rate and regular rhythm.  Pulmonary:     Effort: Pulmonary effort is normal.     Comments: Diffuse expiratory wheezing. Neurological:     Mental Status: He is alert.  Psychiatric:        Mood and Affect: Mood normal.        Behavior: Behavior normal.      UC Treatments / Results  Labs (all labs ordered are listed, but only abnormal results are displayed) Labs Reviewed  NOVEL CORONAVIRUS, NAA (HOSP ORDER, SEND-OUT TO REF LAB; TAT 18-24 HRS)    EKG   Radiology Dg Chest 2 View  Result Date: 07/17/2019 CLINICAL DATA:  Cough, shortness of breath EXAM: CHEST - 2 VIEW COMPARISON:  05/21/2015 FINDINGS: There is hyperinflation of the lungs compatible with COPD. Heart and mediastinal contours are within normal limits. No focal opacities or effusions. No acute bony abnormality. Thoracolumbar scoliosis. IMPRESSION: COPD.  No active cardiopulmonary disease. Electronically Signed   By: Rolm Baptise M.D.   On: 07/17/2019 10:36    Procedures Procedures (including critical care time)  Medications Ordered in UC Medications - No data to display  Initial Impression / Assessment and Plan / UC Course  I have reviewed the triage vital signs and the nursing notes.  Pertinent labs & imaging results that were available during my care of the patient were reviewed by me and considered in my medical decision making (see chart for details).    53 year old male presents with acute bronchitis.  Chest x-ray obtained today and revealed hyperinflation consistent with COPD.  Placing on prednisone and albuterol.  Supportive care.  Final Clinical Impressions(s) / UC Diagnoses   Final diagnoses:  Bronchitis     Discharge Instructions     Medication as prescribed.  Take care  Dr. Lacinda Axon    ED Prescriptions    Medication Sig Dispense Auth. Provider   predniSONE (DELTASONE) 50 MG tablet 1 tablet daily x 5 days 5 tablet Khylah Kendra G, DO   albuterol (VENTOLIN HFA) 108 (90  Base) MCG/ACT inhaler Inhale 1-2 puffs into the lungs every 6 (six) hours as needed for wheezing or shortness of breath. 18 g Coral Spikes, DO     PDMP not reviewed this encounter.   Coral Spikes, DO 07/17/19 1200

## 2019-07-17 NOTE — Discharge Instructions (Signed)
Medication as prescribed.  Take care  Dr. Leroi Haque  

## 2019-07-17 NOTE — ED Triage Notes (Signed)
Patient c/o cough and chest congestion that started last week. Patient was tested last week for COVID, came back negative. Patient continues to cough and having shortness of breath and sweating at night and during the day. Denies fever.

## 2019-07-18 LAB — NOVEL CORONAVIRUS, NAA (HOSP ORDER, SEND-OUT TO REF LAB; TAT 18-24 HRS): SARS-CoV-2, NAA: NOT DETECTED

## 2019-07-31 ENCOUNTER — Ambulatory Visit (INDEPENDENT_AMBULATORY_CARE_PROVIDER_SITE_OTHER): Payer: 59 | Admitting: Psychiatry

## 2019-07-31 ENCOUNTER — Other Ambulatory Visit: Payer: Self-pay

## 2019-07-31 DIAGNOSIS — F902 Attention-deficit hyperactivity disorder, combined type: Secondary | ICD-10-CM

## 2019-07-31 MED ORDER — AMPHETAMINE-DEXTROAMPHETAMINE 20 MG PO TABS: 20.0000 mg | ORAL_TABLET | Freq: Two times a day (BID) | ORAL | 0 refills | Status: DC

## 2019-07-31 NOTE — Progress Notes (Signed)
Follow-up note for this patient with ADHD.  Patient was reached by telephone.  Appropriate conversation.  Patient was alert and oriented to the nature of the conversation.  He had no particular new complaints.  Discussed multiple stressors over the last month including having his car break and having the flu.  Mood however has remained stable.  Medication usage has continued to remain stable and has been helpful with focus.  No sign of any depression.  Sleeping adequately.  Alert and oriented.  No sign of any disorganized thinking paranoia or psychosis.  No suicidal thinking.  No complaints of any side effects from medication.  Good insight and judgment.  Refill Adderall prescription.  Supportive and educational counseling.  Follow-up in 4 weeks.

## 2019-08-08 ENCOUNTER — Other Ambulatory Visit: Payer: Self-pay | Admitting: Family Medicine

## 2019-08-28 ENCOUNTER — Other Ambulatory Visit: Payer: Self-pay

## 2019-08-28 ENCOUNTER — Ambulatory Visit (INDEPENDENT_AMBULATORY_CARE_PROVIDER_SITE_OTHER): Payer: 59 | Admitting: Psychiatry

## 2019-08-28 DIAGNOSIS — F902 Attention-deficit hyperactivity disorder, combined type: Secondary | ICD-10-CM

## 2019-08-28 DIAGNOSIS — F341 Dysthymic disorder: Secondary | ICD-10-CM | POA: Diagnosis not present

## 2019-08-28 MED ORDER — AMPHETAMINE-DEXTROAMPHETAMINE 20 MG PO TABS: 20.0000 mg | ORAL_TABLET | Freq: Two times a day (BID) | ORAL | 0 refills | Status: DC

## 2019-08-28 NOTE — Progress Notes (Signed)
Follow-up appointment for 53 year old man with a history of ADHD.  Spoke to patient on the telephone.  Confirmed his identity in my own.  Patient had some concerns about his mood.  He says that his sister had told him that he seemed like he was more depressed.  Patient admitted to me that he has been feeling a little more run down and negative.  He himself however thinks that it is because of some frustrations he is having at work as well as being disappointed during the holiday season and the change of weather outside.  We discussed the stresses of the pandemic lockdown and how they relate to him.  He is particularly missing a lot of social contact which he previously enjoyed.  He denied any suicidal thoughts whatsoever.  Denied any homicidal thoughts.  Denied any psychotic symptoms.  He told me he still had plenty of things in his life that he enjoyed.  His sleep was a little more disrupted than usual but appetite was normal.  Patient was pleasant and interactive.  Affect sounded slightly lower but still reactive and appropriate.  No sign at all of thought disorder.  I agreed with him that there seemed to be no reason to jump to the conclusion that he was having a major depression or changes treatment.  We discussed how he would absolutely call back for help immediately if he felt like things were getting much worse for him.  Meanwhile renew his Adderall prescription.  We will check up again after the new year.

## 2019-08-30 ENCOUNTER — Ambulatory Visit (INDEPENDENT_AMBULATORY_CARE_PROVIDER_SITE_OTHER): Payer: 59

## 2019-08-30 ENCOUNTER — Ambulatory Visit
Admission: EM | Admit: 2019-08-30 | Discharge: 2019-08-30 | Disposition: A | Discharge status: Home / Self Care | Payer: 59 | Attending: Emergency Medicine | Admitting: Emergency Medicine

## 2019-08-30 ENCOUNTER — Other Ambulatory Visit: Payer: Self-pay

## 2019-08-30 DIAGNOSIS — R059 Cough, unspecified: Secondary | ICD-10-CM

## 2019-08-30 DIAGNOSIS — R05 Cough: Secondary | ICD-10-CM

## 2019-08-30 DIAGNOSIS — J441 Chronic obstructive pulmonary disease with (acute) exacerbation: Secondary | ICD-10-CM

## 2019-08-30 MED ORDER — HYDROCOD POLST-CPM POLST ER 10-8 MG/5ML PO SUER: 5.0000 mL | Freq: Two times a day (BID) | ORAL | 0 refills | Status: DC | PRN

## 2019-08-30 MED ORDER — AEROCHAMBER PLUS MISC: 2 refills | Status: DC

## 2019-08-30 MED ORDER — PREDNISONE 20 MG PO TABS: 40.0000 mg | ORAL_TABLET | Freq: Every day | ORAL | 0 refills | Status: AC

## 2019-08-30 MED ORDER — AMOXICILLIN-POT CLAVULANATE 875-125 MG PO TABS: 1.0000 | ORAL_TABLET | Freq: Two times a day (BID) | ORAL | 0 refills | Status: AC

## 2019-08-30 NOTE — Discharge Instructions (Addendum)
Your chest x-ray was negative for pneumonia, but suggestive of COPD.  I am going to treat you for a COPD exacerbation with regularly scheduled albuterol.  2 puffs from your albuterol inhaler using a spacer every 4 hours for 2 days, then 2 puffs every 6 hours for 2 days, then you may use it as needed.  Finish the antibiotics and steroids.  Follow-up with a primary care physician of your choice as soon as you can, see list below.  Go immediately to the ER if you get worse, have difficulty breathing or for other concerns.  Your Covid test will come back in 18 to 36 hours.  We will call you only if it is positive.  Here is a list of primary care providers who are taking new patients:  Dr. Otilio Miu, Dr. Adline Potter [address] [place] [address] [place] [address]  [phone]  [facility] [address] [place], [place] [address] [phone]  [facility] [place]  [phone] [place], [place] [address]  Here are clinics/ other resources who will see you if you do not have insurance. Some have certain criteria that you must meet. Call them and find out what they are:  [facility]: [address], [place], [place] [address] Phone: [phone] Hours: First and Third Saturdays of each Month, 9 a.m. - 1 p.m.  [facility]: [address], [place], [place], [place] [address] Phone: [phone] Hours: Tuesday, 4 p.m. - 8 p.m. Thursday, 1 p.m. - 8 p.m. Wednesday, 9 a.m. - [facility] [address] [place], [place] [address] Phone: [phone] Pharmacy Phone Number: [phone] Dental Phone Number: [phone] [facility]: [phone]  Dental Hours: Monday - Thursday, 8 a.m. - 6 p.m.  [facility] [address] [place], [place] [address] Phone: [phone] Pharmacy Phone Number:  [phone] [facility] Insurance Help: [phone]  [facility] [address] [place], [place] [address] Phone: [phone] Pharmacy Phone Number: [phone] [facility] Insurance Help: [phone]  [facility] [address] [place], [place] [address] Phone: [phone] [facility] Insurance Help: [phone]   [address] [place], [place] [address] Phone: [phone]  Go to www.goodrx.com to look up your medications. This will give you a list of where you can find your prescriptions at the most affordable prices. Or ask the pharmacist what the cash price is, or if they have any other discount programs available to help make your medication more affordable. This can be less expensive than what you would pay with insurance.

## 2019-08-30 NOTE — ED Triage Notes (Signed)
Patient complains of cough and congestion that he has been having since October. States that symptoms worsened 2-3 days ago. Patient states that cough comes in "fits" and he is coughing up mucus.

## 2019-08-30 NOTE — ED Provider Notes (Signed)
HPI  SUBJECTIVE:  Vincent [name] is a 53 y.o. male who presents with a cough since late October.  He is not sure if it ever completely resolved.  He has had 2 negative Covid tests during this time.  He states that he has gotten much worse in the past 2 days.  States that his sputum has become much thicker and is now yellow, however he is not sure what his baseline sputum color is.  Reports feeling right upper chest soreness followed by coughing spells.  No posttussive emesis.  He reports nasal congestion, wheezing.  No other chest pain.  No fevers, body aches, headaches, sinus pain or pressure, postnasal drip, sore throat, loss of sense of smell or taste, shortness of breath.  No nausea, vomiting, diarrhea, abdominal pain.  No Covid exposure.  No GERD or allergy symptoms.  No antibiotics in the past 3 months.  No antipyretic in the past 4 to 6 hours.  He tried over-the-counter cough medicines without improvement in his symptoms.  He also took prednisone and albuterol that was prescribed to him from here back in October with significant improvement in his cough.  No aggravating factors.  He has a past medical history of asthma in childhood, states that he smokes occasionally.  No diagnosis of emphysema or COPD.  No history of pneumonia, diabetes, hypertension, coronary disease, chronic kidney disease, HIV, cancer, immunocompromise, GERD, allergy, frequent sinusitis.  PMD: None.  Pt seen here 10/22 for cough, chest congestion.  Patient reported negative Covid test.  Chest x-ray showed hyperinflation consistent with COPD, repeat Covid testing sent which was negative, he was thought to have bronchitis and sent home with prednisone and albuterol.  Past Medical History:  Diagnosis Date  . ADHD (attention deficit hyperactivity disorder)   . Asthma     Past Surgical History:  Procedure Laterality Date  . CHOLECYSTECTOMY    . COLON SURGERY      Family History  Problem Relation Age of Onset  . Diabetes  Mother   . Alzheimer's disease Mother   . Depression Sister   . Hypertension Father     Social History   Tobacco Use  . Smoking status: Current Every Day Smoker    Packs/day: 0.50  . Smokeless tobacco: Never Used  Substance Use Topics  . Alcohol use: Yes    Comment: special - occ  . Drug use: No    No current facility-administered medications for this encounter.   Current Outpatient Medications:  .  albuterol (VENTOLIN HFA) 108 (90 Base) MCG/ACT inhaler, Inhale 1-2 puffs into the lungs every 6 (six) hours as needed for wheezing or shortness of breath., Disp: 18 g, Rfl: 0 .  amphetamine-dextroamphetamine (ADDERALL XR) 20 MG 24 hr capsule, Take 2 capsules (40 mg total) by mouth daily., Disp: 60 capsule, Rfl: 0 .  amphetamine-dextroamphetamine (ADDERALL) 20 MG tablet, Take 1 tablet (20 mg total) by mouth 2 (two) times daily., Disp: 60 tablet, Rfl: 0 .  amphetamine-dextroamphetamine (ADDERALL) 20 MG tablet, Take 1 tablet (20 mg total) by mouth 2 (two) times daily with a meal., Disp: 60 tablet, Rfl: 0 .  amoxicillin-clavulanate (AUGMENTIN) 875-125 MG tablet, Take 1 tablet by mouth 2 (two) times daily for 7 days., Disp: 14 tablet, Rfl: 0 .  chlorpheniramine-HYDROcodone (TUSSIONEX PENNKINETIC ER) 10-8 MG/5ML SUER, Take 5 mLs by mouth every 12 (twelve) hours as needed for cough., Disp: 60 mL, Rfl: 0 .  predniSONE (DELTASONE) 20 MG tablet, Take 2 tablets (40 mg total)  by mouth daily with breakfast for 5 days., Disp: 10 tablet, Rfl: 0 .  Spacer/Aero-Holding Chambers (AEROCHAMBER PLUS) inhaler, Use as instructed, Disp: 1 each, Rfl: 2  No Known Allergies   ROS  As noted in HPI.   Physical Exam  BP (!) 113/95 (BP Location: Left Arm)   Pulse (!) 106   Temp 98.1 F (36.7 C) (Oral)   Resp 19   Ht 5\' 10"  (1.778 m)   Wt 65.8 kg   SpO2 97%   BMI 20.81 kg/m   Constitutional: Well developed, well nourished, no acute distress.  Coughing.  Eyes:  EOMI, conjunctiva normal bilaterally HENT:  Normocephalic, atraumatic,mucus membranes moist no nasal congestion.  No maxillary, frontal sinus tenderness.  No obvious postnasal drip. Respiratory: Normal inspiratory effort, diffuse expiratory wheezing throughout all lung fields.  Positive right-sided reproducible upper anterior chest wall tenderness.  Cardiovascular: Regular tachycardia, no murmurs rubs or gallops GI: nondistended skin: No rash, skin intact Musculoskeletal: no deformities Neurologic: Alert & oriented x 3, no focal neuro deficits Psychiatric: Speech and behavior appropriate   ED Course   Medications - No data to display  Orders Placed This Encounter  Procedures  . Novel Coronavirus, NAA (Hosp order, Send-out to Ref Lab; TAT 18-24 hrs    Standing Status:   Standing    Number of Occurrences:   1    Order Specific Question:   Is this test for diagnosis or screening    Answer:   Diagnosis of ill patient    Order Specific Question:   Symptomatic for COVID-19 as defined by CDC    Answer:   Yes    Order Specific Question:   Date of Symptom Onset    Answer:   08/27/2019    Order Specific Question:   Hospitalized for COVID-19    Answer:   No    Order Specific Question:   Admitted to ICU for COVID-19    Answer:   No    Order Specific Question:   Previously tested for COVID-19    Answer:   Yes    Order Specific Question:   Resident in a congregate (group) care setting    Answer:   No    Order Specific Question:   Employed in healthcare setting    Answer:   No  . DG Chest 2 View    Standing Status:   Standing    Number of Occurrences:   1    Order Specific Question:   Reason for Exam (SYMPTOM  OR DIAGNOSIS REQUIRED)    Answer:   cough. smoker, R upper chest tenderness.  r/o mass, PNA effusion, ptx, etc    No results found for this or any previous visit (from the past 24 hour(s)). Dg Chest 2 View  Result Date: 08/30/2019 CLINICAL DATA:  Cough.  Smoker. EXAM: CHEST - 2 VIEW COMPARISON:  07/17/2019 FINDINGS: The  heart size and mediastinal contours are within normal limits. Hyperinflated lungs. Both lungs are clear. Thoracolumbar scoliosis. IMPRESSION: No active cardiopulmonary disease. Electronically Signed   By: Kerby Moors M.D.   On: 08/30/2019 10:46    ED Clinical Impression  1. Cough   2. COPD exacerbation [facility])      ED Assessment/Plan  Previous records reviewed.  As noted in HPI   will check chest x-ray to rule out any acute changes.  If negative, will treat as a COPD exacerbation/bronchitis.  Patient states he has plenty of albuterol at home does not need more.  Will send home with a spacer and have him do regularly scheduled albuterol for the next 4 days.  2 puffs every 4 hours for 2 days, 2 puffs every 6 hours for 2 days, then as needed.  Will send home with prednisone, Augmentin 875 p.o. twice daily for 1 week, Tussionex.  Patient states Kimberlee Nearingessalon makes his stomach hurt.  Also primary care list.  Covid PCR sent.  [place] Narcotic database reviewed for this patient, and feel that the risk/benefit ratio today is favorable for proceeding with a prescription for controlled substance.  No opiate prescriptions in 2 years.  Reviewed imaging independently.  Hyperinflation.  No pneumonia.  see radiology report for full details.  Plan as above   discussed imaging, MDM, treatment plan, and plan for follow-up with patient. Discussed sn/sx that should prompt return to the ED. patient agrees with plan.   Meds ordered this encounter  Medications  . amoxicillin-clavulanate (AUGMENTIN) 875-125 MG tablet    Sig: Take 1 tablet by mouth 2 (two) times daily for 7 days.    Dispense:  14 tablet    Refill:  0  . predniSONE (DELTASONE) 20 MG tablet    Sig: Take 2 tablets (40 mg total) by mouth daily with breakfast for 5 days.    Dispense:  10 tablet    Refill:  0  . Spacer/Aero-Holding Chambers (AEROCHAMBER PLUS) inhaler    Sig: Use as instructed    Dispense:  1 each    Refill:  2  .  chlorpheniramine-HYDROcodone (TUSSIONEX PENNKINETIC ER) 10-8 MG/5ML SUER    Sig: Take 5 mLs by mouth every 12 (twelve) hours as needed for cough.    Dispense:  60 mL    Refill:  0    *This clinic note was created using Scientist, clinical (histocompatibility and immunogenetics)Dragon dictation software. Therefore, there may be occasional mistakes despite careful proofreading.   ?    Domenick GongMortenson, Annalis Kaczmarczyk, MD 08/30/19 1155

## 2019-08-31 LAB — NOVEL CORONAVIRUS, NAA (HOSP ORDER, SEND-OUT TO REF LAB; TAT 18-24 HRS): SARS-CoV-2, NAA: NOT DETECTED

## 2019-09-01 ENCOUNTER — Ambulatory Visit: Payer: 59 | Admitting: Psychiatry

## 2019-09-21 ENCOUNTER — Encounter: Payer: Self-pay | Admitting: Emergency Medicine

## 2019-09-21 ENCOUNTER — Ambulatory Visit
Admission: EM | Admit: 2019-09-21 | Discharge: 2019-09-21 | Disposition: A | Discharge status: Home / Self Care | Payer: Managed Care, Other (non HMO) | Attending: Internal Medicine | Admitting: Internal Medicine

## 2019-09-21 ENCOUNTER — Other Ambulatory Visit: Payer: Self-pay

## 2019-09-21 DIAGNOSIS — J441 Chronic obstructive pulmonary disease with (acute) exacerbation: Secondary | ICD-10-CM

## 2019-09-21 MED ORDER — ALBUTEROL SULFATE HFA 108 (90 BASE) MCG/ACT IN AERS: 2.0000 | INHALATION_SPRAY | Freq: Four times a day (QID) | RESPIRATORY_TRACT | 0 refills | Status: DC | PRN

## 2019-09-21 MED ORDER — BREO ELLIPTA 100-25 MCG/INH IN AEPB: 1.0000 | INHALATION_SPRAY | Freq: Every day | RESPIRATORY_TRACT | 1 refills | Status: DC

## 2019-09-21 MED ORDER — PREDNISONE 20 MG PO TABS: 20.0000 mg | ORAL_TABLET | Freq: Every day | ORAL | 0 refills | Status: AC

## 2019-09-21 NOTE — ED Triage Notes (Signed)
Pt c/o cough, shortness of breath. Started about 3 weeks ago. He states that he was seen here before and given an inhaler that helped. He has an appt with a PCP for this but is not until the first of the year. He was tested for covid on 08/30/19 and was negative.

## 2019-09-21 NOTE — ED Provider Notes (Signed)
[facility]    CSN: [id number] Arrival date & time: 09/21/19  1108      History   Chief Complaint Chief Complaint  Patient presents with  . Cough    APPT  . Shortness of Breath    HPI Vincent [name] is a 53 y.o. male with a history of COPD comes to urgent care with complaints of worsening shortness of breath, chest tightness and wheezing.  Symptoms started about 3 weeks ago and has been persistent.  Patient was seen here recently and was prescribed albuterol inhaler which helped his symptoms significantly.  He was also prescribed prednisone during the visit.  Patient admits to having some chest tightness and wheezing.  Cough is minimally productive.  No palpitations, near syncope or syncopal episodes.  No sick contacts or exposure to COVID-19 positive individuals   HPI  Past Medical History:  Diagnosis Date  . ADHD (attention deficit hyperactivity disorder)   . Asthma     Patient Active Problem List   Diagnosis Date Noted  . Dysthymia 04/22/2015  . ADD (attention deficit disorder) 03/25/2015  . Anxiety 03/25/2015  . 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 CHOLECYSTECTOMY    . COLON SURGERY         Home Medications    Prior to Admission medications   Medication Sig Start Date End Date Taking? Authorizing Provider  amphetamine-dextroamphetamine (ADDERALL XR) 20 MG 24 hr capsule Take 2 capsules (40 mg total) by mouth daily. 07/01/19  Yes Clapacs, Jackquline Denmark, MD  amphetamine-dextroamphetamine (ADDERALL) 20 MG tablet Take 1 tablet (20 mg total) by mouth 2 (two) times daily. 07/31/19  Yes Clapacs, Jackquline Denmark, MD  amphetamine-dextroamphetamine (ADDERALL) 20 MG tablet Take 1 tablet (20 mg total) by mouth 2 (two) times daily with a meal. 08/28/19  Yes Clapacs, Jackquline Denmark, MD  albuterol (VENTOLIN HFA) 108 (90 Base) MCG/ACT inhaler Inhale 2 puffs into the lungs every 6 (six) hours as  needed for wheezing or shortness of breath. 09/21/19   Shafer Swamy, Britta Mccreedy, MD  fluticasone furoate-vilanterol (BREO ELLIPTA) 100-25 MCG/INH AEPB Inhale 1 puff into the lungs daily. 09/21/19   Christo Hain, Britta Mccreedy, MD  predniSONE (DELTASONE) 20 MG tablet Take 1 tablet (20 mg total) by mouth daily for 5 days. 09/21/19 09/26/19  Merrilee Jansky, MD  Spacer/Aero-Holding Chambers (AEROCHAMBER PLUS) inhaler Use as instructed 08/30/19   Domenick Gong, MD    Family History Family History  Problem Relation Age of Onset  . Diabetes Mother   . Alzheimer's disease Mother   . Depression Sister   . Hypertension Father     Social History Social History   Tobacco Use  . Smoking status: Former Smoker    Packs/day: 0.50  . Smokeless tobacco: Never Used  . Tobacco comment: "I quit months ago"  Substance Use Topics  . Alcohol use: Yes    Comment: special - occ  . Drug use: No     Allergies   Patient has no known allergies.   Review of Systems Review of Systems  Constitutional: Negative for activity change, fatigue and fever.  HENT: Negative.   Respiratory: Positive for cough, chest tightness, shortness of breath and wheezing.   Cardiovascular: Negative for chest pain and palpitations.  Gastrointestinal: Negative for abdominal pain, diarrhea, nausea and vomiting.  Genitourinary: Negative.   Musculoskeletal: Negative for arthralgias, joint swelling and myalgias.  Neurological: Negative for dizziness, light-headedness, numbness  and headaches.  Psychiatric/Behavioral: Negative for confusion and decreased concentration.     Physical Exam Triage Vital Signs ED Triage Vitals  Enc Vitals Group     BP 09/21/19 1151 122/83     Pulse Rate 09/21/19 1151 71     Resp 09/21/19 1151 18     Temp 09/21/19 1151 98 F (36.7 C)     Temp Source 09/21/19 1151 Oral     SpO2 09/21/19 1151 98 %     Weight 09/21/19 1147 135 lb (61.2 kg)     Height 09/21/19 1147 5\' 11"  (1.803 m)     Head Circumference --       Peak Flow --      Pain Score 09/21/19 1147 0     Pain Loc --      Pain Edu? --      Excl. in [place]? --    No data found.  Updated Vital Signs BP 122/83 (BP Location: Left Arm)   Pulse 71   Temp 98 F (36.7 C) (Oral)   Resp 18   Ht 5\' 11"  (1.803 m)   Wt 61.2 kg   SpO2 98%   BMI 18.83 kg/m   Visual Acuity Right Eye Distance:   Left Eye Distance:   Bilateral Distance:    Right Eye Near:   Left Eye Near:    Bilateral Near:     Physical Exam Vitals and nursing note reviewed.  Constitutional:      General: He is not in acute distress.    Appearance: He is not ill-appearing.  HENT:     Mouth/Throat:     Mouth: Mucous membranes are moist.     Pharynx: No oropharyngeal exudate.  Neck:     Thyroid: No thyromegaly.     Vascular: No hepatojugular reflux.  Cardiovascular:     Rate and Rhythm: Normal rate and regular rhythm.  Pulmonary:     Breath sounds: Examination of the right-middle field reveals rhonchi. Examination of the right-lower field reveals rhonchi. Rhonchi present. No decreased breath sounds, wheezing or rales.  Chest:     Chest wall: No mass, deformity or crepitus.  Abdominal:     General: Bowel sounds are normal.     Palpations: Abdomen is soft.     Tenderness: There is guarding. There is no rebound.  Musculoskeletal:        General: Normal range of motion.     Cervical back: Normal range of motion and neck supple.  Lymphadenopathy:     Cervical: No cervical adenopathy.  Skin:    General: Skin is warm.     Capillary Refill: Capillary refill takes less than 2 seconds.     Findings: No ecchymosis or erythema.  Neurological:     General: No focal deficit present.     Mental Status: He is alert and oriented to person, place, and time.      UC Treatments / Results  Labs (all labs ordered are listed, but only abnormal results are displayed) Labs Reviewed - No data to display  EKG   Radiology No results found.  Procedures Procedures  (including critical care time)  Medications Ordered in UC Medications - No data to display  Initial Impression / Assessment and Plan / UC Course  I have reviewed the triage vital signs and the nursing notes.  Pertinent labs & imaging results that were available during my care of the patient were reviewed by me and considered in my medical decision making (see  chart for details).    1.  COPD with acute exacerbation: Prednisone 20 mg orally daily for 5 days Refill albuterol inhaler Breo ellipta, 1 capsule inhaled daily If patient symptoms worsens he is advised to return to urgent care to be reevaluated.  Patient has successfully quit tobacco use. Final Clinical Impressions(s) / UC Diagnoses   Final diagnoses:  COPD exacerbation [facility])   Discharge Instructions   None    ED Prescriptions    Medication Sig Dispense Auth. Provider   albuterol (VENTOLIN HFA) 108 (90 Base) MCG/ACT inhaler Inhale 2 puffs into the lungs every 6 (six) hours as needed for wheezing or shortness of breath. 18 g Shaquina Gillham, Myrene Galas, MD   fluticasone furoate-vilanterol (BREO ELLIPTA) 100-25 MCG/INH AEPB Inhale 1 puff into the lungs daily. 28 each Latoya Diskin, Myrene Galas, MD   predniSONE (DELTASONE) 20 MG tablet Take 1 tablet (20 mg total) by mouth daily for 5 days. 5 tablet Haydan Wedig, Myrene Galas, MD     PDMP not reviewed this encounter.   Chase Picket, MD 09/21/19 1240

## 2019-09-30 ENCOUNTER — Ambulatory Visit: Payer: Managed Care, Other (non HMO) | Admitting: Psychiatry

## 2019-09-30 ENCOUNTER — Other Ambulatory Visit: Payer: Self-pay

## 2019-10-01 ENCOUNTER — Other Ambulatory Visit: Payer: Self-pay | Admitting: Psychiatry

## 2019-10-01 MED ORDER — AMPHETAMINE-DEXTROAMPHETAMINE 20 MG PO TABS: 20.0000 mg | ORAL_TABLET | Freq: Two times a day (BID) | ORAL | 0 refills | Status: DC

## 2019-10-26 ENCOUNTER — Ambulatory Visit
Admission: EM | Admit: 2019-10-26 | Discharge: 2019-10-26 | Disposition: A | Discharge status: Home / Self Care | Payer: Managed Care, Other (non HMO) | Attending: Family Medicine | Admitting: Family Medicine

## 2019-10-26 ENCOUNTER — Encounter: Payer: Self-pay | Admitting: Emergency Medicine

## 2019-10-26 ENCOUNTER — Other Ambulatory Visit: Payer: Self-pay

## 2019-10-26 ENCOUNTER — Ambulatory Visit (INDEPENDENT_AMBULATORY_CARE_PROVIDER_SITE_OTHER): Payer: Managed Care, Other (non HMO)

## 2019-10-26 DIAGNOSIS — R0789 Other chest pain: Secondary | ICD-10-CM

## 2019-10-26 DIAGNOSIS — R05 Cough: Secondary | ICD-10-CM

## 2019-10-26 DIAGNOSIS — Z87891 Personal history of nicotine dependence: Secondary | ICD-10-CM

## 2019-10-26 DIAGNOSIS — M940 Chondrocostal junction syndrome [Tietze]: Secondary | ICD-10-CM

## 2019-10-26 MED ORDER — HYDROCODONE-ACETAMINOPHEN 5-325 MG PO TABS: ORAL_TABLET | ORAL | 0 refills | Status: DC

## 2019-10-26 MED ORDER — KETOROLAC TROMETHAMINE 10 MG PO TABS: 10.0000 mg | ORAL_TABLET | Freq: Three times a day (TID) | ORAL | 0 refills | Status: DC | PRN

## 2019-10-26 MED ORDER — KETOROLAC TROMETHAMINE 60 MG/2ML IM SOLN
60.0000 mg | Freq: Once | INTRAMUSCULAR | Status: AC
  Administered 2019-10-26: 60 mg via INTRAMUSCULAR

## 2019-10-26 NOTE — Discharge Instructions (Signed)
Rest, ice, over the counter tylenol as needed 

## 2019-10-26 NOTE — ED Provider Notes (Signed)
[facility]    CSN: [id number] Arrival date & time: 10/26/19  9242      History   Chief Complaint Chief Complaint  Patient presents with  . Rib Pain    HPI Vincent [name] is a 54 y.o. male.   54 yo male with a c/o left sided lower rib pain since yesterday. States pain is worse with position changes and deep breaths. Denies any falls or other injuries. Denies any fevers, chills, wheezing, shortness of breath.      Past Medical History:  Diagnosis Date  . ADHD (attention deficit hyperactivity disorder)   . Asthma     Patient Active Problem List   Diagnosis Date Noted  . Dysthymia 04/22/2015  . ADD (attention deficit disorder) 03/25/2015  . Anxiety 03/25/2015  . 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 CHOLECYSTECTOMY    . COLON SURGERY         Home Medications    Prior to Admission medications   Medication Sig Start Date End Date Taking? Authorizing Provider  albuterol (VENTOLIN HFA) 108 (90 Base) MCG/ACT inhaler Inhale 2 puffs into the lungs every 6 (six) hours as needed for wheezing or shortness of breath. 09/21/19  Yes Lamptey, Britta Mccreedy, MD  amphetamine-dextroamphetamine (ADDERALL XR) 20 MG 24 hr capsule Take 2 capsules (40 mg total) by mouth daily. 07/01/19  Yes Clapacs, Jackquline Denmark, MD  fluticasone furoate-vilanterol (BREO ELLIPTA) 100-25 MCG/INH AEPB Inhale 1 puff into the lungs daily. 09/21/19  Yes Lamptey, Britta Mccreedy, MD  amphetamine-dextroamphetamine (ADDERALL) 20 MG tablet Take 1 tablet (20 mg total) by mouth 2 (two) times daily with a meal. 08/28/19   Clapacs, Jackquline Denmark, MD  amphetamine-dextroamphetamine (ADDERALL) 20 MG tablet Take 1 tablet (20 mg total) by mouth 2 (two) times daily. 10/01/19   Clapacs, Jackquline Denmark, MD  HYDROcodone-acetaminophen (NORCO/VICODIN) 5-325 MG tablet 1-2 tabs po qd prn 10/26/19   Payton Mccallum, MD  ketorolac (TORADOL) 10 MG tablet Take 1  tablet (10 mg total) by mouth every 8 (eight) hours as needed. 10/26/19   Payton Mccallum, MD  Spacer/Aero-Holding Chambers (AEROCHAMBER PLUS) inhaler Use as instructed 08/30/19   Domenick Gong, MD    Family History Family History  Problem Relation Age of Onset  . Diabetes Mother   . Alzheimer's disease Mother   . Depression Sister   . Hypertension Father     Social History Social History   Tobacco Use  . Smoking status: Former Smoker    Packs/day: 0.50  . Smokeless tobacco: Never Used  . Tobacco comment: "I quit months ago"  Substance Use Topics  . Alcohol use: Yes    Comment: special - occ  . Drug use: No     Allergies   Patient has no known allergies.   Review of Systems Review of Systems   Physical Exam Triage Vital Signs ED Triage Vitals  Enc Vitals Group     BP 10/26/19 0916 (!) 121/93     Pulse Rate 10/26/19 0916 70     Resp 10/26/19 0916 16     Temp 10/26/19 0916 97.8 F (36.6 C)     Temp Source 10/26/19 0916 Oral     SpO2 10/26/19 0916 98 %     Weight 10/26/19 0913 145 lb (65.8 kg)     Height 10/26/19 0913 5\' 11"  (1.803 m)     Head Circumference --  Peak Flow --      Pain Score 10/26/19 0913 7     Pain Loc --      Pain Edu? --      Excl. in [place]? --    No data found.  Updated Vital Signs BP (!) 121/93 (BP Location: Right Arm)   Pulse 70   Temp 97.8 F (36.6 C) (Oral)   Resp 16   Ht 5\' 11"  (1.803 m)   Wt 65.8 kg   SpO2 98%   BMI 20.22 kg/m   Visual Acuity Right Eye Distance:   Left Eye Distance:   Bilateral Distance:    Right Eye Near:   Left Eye Near:    Bilateral Near:     Physical Exam Vitals and nursing note reviewed.  Constitutional:      General: He is not in acute distress.    Appearance: He is not toxic-appearing or diaphoretic.  Cardiovascular:     Rate and Rhythm: Normal rate.     Heart sounds: Normal heart sounds.  Pulmonary:     Effort: Pulmonary effort is normal. No respiratory distress.     Breath  sounds: Normal breath sounds. No stridor. No wheezing, rhonchi or rales.  Chest:     Chest wall: Tenderness (left lower chest wall) present.  Neurological:     Mental Status: He is alert.      UC Treatments / Results  Labs (all labs ordered are listed, but only abnormal results are displayed) Labs Reviewed - No data to display  EKG   Radiology DG Ribs Unilateral W/Chest Left  Result Date: 10/26/2019 CLINICAL DATA:  Left rib pain worsening over 2 days.  Recent cough. EXAM: LEFT RIBS AND CHEST - 3+ VIEW COMPARISON:  Chest radiograph 08/30/2019 FINDINGS: Normal cardiac and mediastinal contours. No consolidative pulmonary opacities. Minimal left basilar atelectasis. No pleural effusion or pneumothorax. Scoliotic curvature of the thoracolumbar spine. No evidence for acute displaced rib fracture. IMPRESSION: Minimal left basilar atelectasis. No evidence for acute displaced rib fracture. Electronically Signed   By: Lovey Newcomer M.D.   On: 10/26/2019 09:49    Procedures Procedures (including critical care time)  Medications Ordered in UC Medications  ketorolac (TORADOL) injection 60 mg (60 mg Intramuscular Given 10/26/19 0931)    Initial Impression / Assessment and Plan / UC Course  I have reviewed the triage vital signs and the nursing notes.  Pertinent labs & imaging results that were available during my care of the patient were reviewed by me and considered in my medical decision making (see chart for details).      Final Clinical Impressions(s) / UC Diagnoses   Final diagnoses:  Costochondritis     Discharge Instructions     Rest, ice, over the counter tylenol as needed    ED Prescriptions    Medication Sig Dispense Auth. Provider   ketorolac (TORADOL) 10 MG tablet Take 1 tablet (10 mg total) by mouth every 8 (eight) hours as needed. 15 tablet Roena Sassaman, Linward Foster, MD   HYDROcodone-acetaminophen (NORCO/VICODIN) 5-325 MG tablet 1-2 tabs po qd prn 6 tablet Norval Gable, MD        1. x-ray results and diagnosis reviewed with patient; patient given toradol 60mg  IM x 1 with improvement of symptoms 2. rx as per orders above; reviewed possible side effects, interactions, risks and benefits  3. Recommend supportive treatment with  4. Follow-up prn if symptoms worsen or don't improve  I have reviewed the PDMP during this encounter.  Payton Mccallum, MD 10/26/19 1023

## 2019-10-26 NOTE — ED Triage Notes (Signed)
Patient c/o left sided rib pain that started yesterday.  Patient states that when he takes and deep breath or cough his pain worsens.  Patient denies falls or injury.

## 2019-10-28 ENCOUNTER — Ambulatory Visit (INDEPENDENT_AMBULATORY_CARE_PROVIDER_SITE_OTHER): Payer: Managed Care, Other (non HMO) | Admitting: Psychiatry

## 2019-10-28 ENCOUNTER — Encounter: Payer: Self-pay | Admitting: Psychiatry

## 2019-10-28 ENCOUNTER — Other Ambulatory Visit: Payer: Self-pay

## 2019-10-28 DIAGNOSIS — F902 Attention-deficit hyperactivity disorder, combined type: Secondary | ICD-10-CM

## 2019-10-28 DIAGNOSIS — F341 Dysthymic disorder: Secondary | ICD-10-CM

## 2019-10-28 MED ORDER — AMPHETAMINE-DEXTROAMPHETAMINE 20 MG PO TABS: 20.0000 mg | ORAL_TABLET | Freq: Two times a day (BID) | ORAL | 0 refills | Status: DC

## 2019-10-28 NOTE — Progress Notes (Signed)
Follow-up for this 54 year old gentleman with ADHD.  Today's conversation was more emotional than they often seem to be.  He talked about how the stress of quarantine and isolation along with the stress of the uncertainty and job insecurity of his line of work have started to get to him.  He had some tearfulness appropriately in the conversation.  Very frustrated at times but still managing to work.  Also taking care of his health fine and perhaps most importantly staying in contact with his friends and support system.  Medication continues to be appropriate and effective.  Patient was alert lucid and appropriate with no sign of dangerousness.  Denied any suicidal thoughts denied any psychosis.  Supportive therapy and encouragement.  Validation of how stressful his situation really has been.  We discussed the option of adding medication but he prefers to keep a close eye on himself for the next couple weeks.  He will call me sooner if he thinks his mood is staying more persistently down otherwise we will check up again in a month.  Continue current Adderall prescription.

## 2019-11-25 ENCOUNTER — Other Ambulatory Visit: Payer: Self-pay

## 2019-11-25 ENCOUNTER — Ambulatory Visit (INDEPENDENT_AMBULATORY_CARE_PROVIDER_SITE_OTHER): Payer: 59 | Admitting: Psychiatry

## 2019-11-25 ENCOUNTER — Encounter: Payer: Self-pay | Admitting: Psychiatry

## 2019-11-25 DIAGNOSIS — F341 Dysthymic disorder: Secondary | ICD-10-CM

## 2019-11-25 DIAGNOSIS — F902 Attention-deficit hyperactivity disorder, combined type: Secondary | ICD-10-CM

## 2019-11-25 MED ORDER — AMPHETAMINE-DEXTROAMPHETAMINE 20 MG PO TABS: 20.0000 mg | ORAL_TABLET | Freq: Two times a day (BID) | ORAL | 0 refills | Status: DC

## 2019-11-25 NOTE — Progress Notes (Signed)
Follow-up for this patient with ADHD.  Patient was reached by telephone.  Identity confirmed.  This month he is sounding better.  Less anxious and stressed.  Not tearful not depressed.  Work has gotten a little more intense but in a way he sort of thrives on that.  Affect euthymic and reactive thoughts lucid.  No loosening of associations no delusions no psychotic thinking.  No suicidal or homicidal thought.  We discussed the positive benefits that he gets in focus and attention from his medicine.  Review medication.  Refill Adderall follow-up 1 month.

## 2019-12-25 ENCOUNTER — Ambulatory Visit (INDEPENDENT_AMBULATORY_CARE_PROVIDER_SITE_OTHER): Payer: 59 | Admitting: Psychiatry

## 2019-12-25 ENCOUNTER — Other Ambulatory Visit: Payer: Self-pay

## 2019-12-25 ENCOUNTER — Encounter: Payer: Self-pay | Admitting: Psychiatry

## 2019-12-25 DIAGNOSIS — F902 Attention-deficit hyperactivity disorder, combined type: Secondary | ICD-10-CM

## 2019-12-25 MED ORDER — AMPHETAMINE-DEXTROAMPHETAMINE 20 MG PO TABS: 20.0000 mg | ORAL_TABLET | Freq: Two times a day (BID) | ORAL | 0 refills | Status: DC

## 2019-12-25 NOTE — Progress Notes (Signed)
Follow-up for this 54 year old man with ADHD and chronic anxiety.  Patient was reached by telephone.  He was on time appropriate and normally interactive.  Patient reports that this month he has been very busy with work.  Mood has been stable.  Seems to be upbeat in general.  Staying in contact with his girlfriend in [place] regularly.  Sleeping well.  Physical activity normal.  No complaints reported.  Alert and oriented x4.  Appropriate affect.  Thoughts normal.  No suicidal or homicidal ideation.  No sign of psychosis.  Good judgment and insight.  Supportive counseling.  Refill his Adderall prescription follow-up next month

## 2020-01-27 ENCOUNTER — Telehealth (INDEPENDENT_AMBULATORY_CARE_PROVIDER_SITE_OTHER): Payer: 59 | Admitting: Psychiatry

## 2020-01-27 ENCOUNTER — Encounter: Payer: Self-pay | Admitting: Psychiatry

## 2020-01-27 ENCOUNTER — Other Ambulatory Visit: Payer: Self-pay

## 2020-01-27 DIAGNOSIS — F341 Dysthymic disorder: Secondary | ICD-10-CM

## 2020-01-27 DIAGNOSIS — F902 Attention-deficit hyperactivity disorder, combined type: Secondary | ICD-10-CM

## 2020-01-27 MED ORDER — AMPHETAMINE-DEXTROAMPHETAMINE 20 MG PO TABS: 20.0000 mg | ORAL_TABLET | Freq: Two times a day (BID) | ORAL | 0 refills | Status: DC

## 2020-01-27 NOTE — Progress Notes (Signed)
Follow-up appointment with this 54 year old man with ADHD and dysthymia.  Patient was reached by telephone.  Identified self and patient.  Patient reports it has been a good month although a busy 1.  He spoke about how he feels gratified by the quality of work that he is doing and his friendships.  He has gotten his vaccination and is looking forward to more socializing.  Continues to focus well with mood stable and generally upbeat.  No side effects of medicine.  No new complaints.  Pleasant and interactive.  Lucid thinking.  Alert and oriented.  No sign of thought disorder or delusions.  Supportive counseling and therapy.  Refill medication.  Follow-up next month.

## 2020-03-02 ENCOUNTER — Encounter: Payer: Self-pay | Admitting: Psychiatry

## 2020-03-02 ENCOUNTER — Other Ambulatory Visit: Payer: Self-pay

## 2020-03-02 ENCOUNTER — Telehealth (INDEPENDENT_AMBULATORY_CARE_PROVIDER_SITE_OTHER): Payer: 59 | Admitting: Psychiatry

## 2020-03-02 DIAGNOSIS — F902 Attention-deficit hyperactivity disorder, combined type: Secondary | ICD-10-CM | POA: Diagnosis not present

## 2020-03-02 MED ORDER — AMPHETAMINE-DEXTROAMPHETAMINE 20 MG PO TABS: 20.0000 mg | ORAL_TABLET | Freq: Two times a day (BID) | ORAL | 0 refills | Status: DC

## 2020-03-02 NOTE — Progress Notes (Signed)
Follow-up note for this 54 year old man with a history of ADHD.  Reach him by telephone.  Identified myself.  Total time on the phone 15 minutes.  Patient has no new complaints.  Has been working very hard this month.  He discussed some of the stresses he has as his work is been very demanding.  He is planning to take a vacation soon.  No change in mood.  Still sleeping adequately.  Focusing adequately with current medicine.  Patient has no psychotic symptoms no suicidal ideation no indication of dangerousness.  Mental status exam all unremarkable and normal.  Review medication.  Refilled medicine.  Supportive and educational counseling.  Follow-up 1 month.

## 2020-04-01 ENCOUNTER — Encounter: Payer: Self-pay | Admitting: Psychiatry

## 2020-04-01 ENCOUNTER — Other Ambulatory Visit: Payer: Self-pay

## 2020-04-01 ENCOUNTER — Telehealth (INDEPENDENT_AMBULATORY_CARE_PROVIDER_SITE_OTHER): Payer: 59 | Admitting: Psychiatry

## 2020-04-01 DIAGNOSIS — F902 Attention-deficit hyperactivity disorder, combined type: Secondary | ICD-10-CM | POA: Diagnosis not present

## 2020-04-01 DIAGNOSIS — F341 Dysthymic disorder: Secondary | ICD-10-CM | POA: Diagnosis not present

## 2020-04-01 MED ORDER — AMPHETAMINE-DEXTROAMPHETAMINE 20 MG PO TABS: 20.0000 mg | ORAL_TABLET | Freq: Two times a day (BID) | ORAL | 0 refills | Status: DC

## 2020-04-01 NOTE — Progress Notes (Signed)
Follow-up for patient with ADHD and chronic anxiety and dysphoria.  Patient reports he is feeling much better this month.  He is working on achieving a better work life balance.  Took a little bit of time off.  He is reflective about needing to plan for retirement in the future.  Realistic about current work plans.  Realistic about relationship with the woman in [place].  It sounds like after this long period of time it has settled into more of a friendship then unintended romance.  No new physical complaints.  Good focus and concentration.  Alert and oriented.  Appropriate.  Euthymic.  No dangerousness no suicidality no aggression.  No side effects to medicine.  Supportive counseling and therapy.  Review medicine.  Refill Adderall.  Follow-up in a month.  Total time on the phone with patient 20 minutes.

## 2020-04-06 ENCOUNTER — Ambulatory Visit (INDEPENDENT_AMBULATORY_CARE_PROVIDER_SITE_OTHER): Payer: 59 | Admitting: Psychiatry

## 2020-04-06 ENCOUNTER — Encounter: Payer: Self-pay | Admitting: Psychiatry

## 2020-04-06 ENCOUNTER — Other Ambulatory Visit: Payer: Self-pay

## 2020-04-06 DIAGNOSIS — F4323 Adjustment disorder with mixed anxiety and depressed mood: Secondary | ICD-10-CM | POA: Diagnosis not present

## 2020-04-06 MED ORDER — CLONAZEPAM 0.5 MG PO TABS: 0.2500 mg | ORAL_TABLET | Freq: Three times a day (TID) | ORAL | 0 refills | Status: DC | PRN

## 2020-04-06 NOTE — Progress Notes (Signed)
Follow-up urgent appointment for this 54 year old man with a history of ADHD and anxiety.  Patient called requesting speak to me urgently today.  Reach the patient on the phone.  Total time on the phone 20 minutes.  Identified myself and the patient.  He is having acute severe anxiety.  Friday he finished the project he had been working very hard on and feels that he was greeted with a lack of appreciation and an immediate assignment to more work.  That day he developed severe emotional symptoms.  Became very raw in his feelings has been crying frequently since feeling nervous and confused.  He does not report any psychotic symptoms.  He does not report any suicidal thoughts.  He has been continuing to take his usual medicine as normal.  Very different mental status from usual.  He is completely lucid and clear no sign of delusions or disorganized thinking or hallucinations but he is tearful throughout.  Frequent minor thought blocking from anxiety.  Feeling of anxiety and hopelessness.  Patient and I reviewed the multiple stresses that he has been putting up with for really a couple years or more.  The death of his mother, the delay of his relationship because of the pandemic and now this isolated work that has caused him at times to go days without sleep.  Offered patient validation and support.  I do not think that there is an acute safety risk.  I offered him the suggestion of clonazepam as needed for anxiety.  Prescription done for 0.25 mg up to 3 times a day as needed for anxiety.  I am going to put him on the schedule and speak to him again in 48 hours.

## 2020-04-08 ENCOUNTER — Encounter: Payer: Self-pay | Admitting: Psychiatry

## 2020-04-08 ENCOUNTER — Other Ambulatory Visit: Payer: Self-pay

## 2020-04-08 ENCOUNTER — Telehealth (INDEPENDENT_AMBULATORY_CARE_PROVIDER_SITE_OTHER): Payer: 59 | Admitting: Psychiatry

## 2020-04-08 DIAGNOSIS — F331 Major depressive disorder, recurrent, moderate: Secondary | ICD-10-CM

## 2020-04-08 MED ORDER — ESCITALOPRAM OXALATE 10 MG PO TABS: 10.0000 mg | ORAL_TABLET | Freq: Every day | ORAL | 1 refills | Status: DC

## 2020-04-08 NOTE — Progress Notes (Signed)
Follow-up for this patient normally followed primarily for ADHD.  This is the second time we have spoken this week.  He continues to have a remarkable turnaround in his mood with a lot of depression.  Today he is describing himself as being depressed.  Talks about feeling hopeless and "losing faith".  His friends have been telling him that he is not his normal self.  He feels unmotivated and tired but he is sleeping well.  No report of any psychotic symptoms.  Denies suicidal ideation.  Has taken the clonazepam only a little bit with some benefit.  Affect sounds remarkably different than usual.  Dysphoric down negative somewhat irritable.  No loosening of associations no evidence of delusions or disorganized thinking.  No suicidal thoughts.  No psychosis.  Supportive counseling encouragement and validation.  I let him know that I will be away for the next 2 weeks.  I also recommended that although the symptoms of only been present for about a week that we go ahead and start medicine for depression.  Patient is agreeable to starting Lexapro 10 mg once a day.  Continue other medicines.  Prescription done.  I will try to speak to him in the week that I come back from vacation and meanwhile he is aware that the office is still open.

## 2020-04-29 ENCOUNTER — Telehealth (INDEPENDENT_AMBULATORY_CARE_PROVIDER_SITE_OTHER): Payer: 59 | Admitting: Psychiatry

## 2020-04-29 ENCOUNTER — Encounter: Payer: Self-pay | Admitting: Psychiatry

## 2020-04-29 ENCOUNTER — Other Ambulatory Visit: Payer: Self-pay

## 2020-04-29 DIAGNOSIS — F4323 Adjustment disorder with mixed anxiety and depressed mood: Secondary | ICD-10-CM

## 2020-04-29 DIAGNOSIS — F331 Major depressive disorder, recurrent, moderate: Secondary | ICD-10-CM

## 2020-04-29 DIAGNOSIS — F902 Attention-deficit hyperactivity disorder, combined type: Secondary | ICD-10-CM | POA: Diagnosis not present

## 2020-04-29 MED ORDER — AMPHETAMINE-DEXTROAMPHETAMINE 20 MG PO TABS: 20.0000 mg | ORAL_TABLET | Freq: Two times a day (BID) | ORAL | 0 refills | Status: DC

## 2020-04-29 NOTE — Progress Notes (Signed)
Follow-up note for this 54 year old man with a history of ADHD and recent depressive symptoms.  Patient was reached by telephone.  Identified him and myself.  Patient reports that he is doing much better than he was 2 weeks ago.  His energy level is returning.  His thoughts are more clear.  He is not feeling hopeless and overwhelmed in the manner that he was a couple weeks ago.  Not having any suicidal thoughts.  Tolerating medicine well.  Taking the Lexapro regularly and notes no side effects.  Patient was sounding much more like his normal self.  Able to laugh and show full range of affect and not reporting feeling hopeless.  Having positive plans for the future.  Alert and oriented.  Reactive and appropriate affect.  Thoughts lucid.  Able to ask appropriate questions and make appropriate plans for the future.  Reviewed medications including continuing his Adderall the as needed Klonopin which he is using sparingly at night if he is using it at all and the Lexapro.  No change to medicine for today.  Refilled medicines that needed to be refilled.  We will talk again in another 2 weeks.

## 2020-04-30 ENCOUNTER — Other Ambulatory Visit: Payer: Self-pay | Admitting: Psychiatry

## 2020-05-07 ENCOUNTER — Other Ambulatory Visit: Payer: Self-pay | Admitting: Psychiatry

## 2020-05-07 MED ORDER — ESCITALOPRAM OXALATE 10 MG PO TABS: 10.0000 mg | ORAL_TABLET | Freq: Every day | ORAL | 1 refills | Status: DC

## 2020-05-07 MED ORDER — AMPHETAMINE-DEXTROAMPHETAMINE 20 MG PO TABS: 20.0000 mg | ORAL_TABLET | Freq: Two times a day (BID) | ORAL | 0 refills | Status: DC

## 2020-05-07 MED ORDER — CLONAZEPAM 0.5 MG PO TABS: 0.2500 mg | ORAL_TABLET | Freq: Three times a day (TID) | ORAL | 0 refills | Status: DC | PRN

## 2020-05-07 MED ORDER — AMPHETAMINE-DEXTROAMPHETAMINE 20 MG PO TABS: 10.0000 mg | ORAL_TABLET | Freq: Two times a day (BID) | ORAL | 0 refills | Status: DC

## 2020-05-20 ENCOUNTER — Encounter: Payer: Self-pay | Admitting: Psychiatry

## 2020-05-20 ENCOUNTER — Other Ambulatory Visit: Payer: Self-pay

## 2020-05-20 ENCOUNTER — Telehealth (INDEPENDENT_AMBULATORY_CARE_PROVIDER_SITE_OTHER): Payer: 59 | Admitting: Psychiatry

## 2020-05-20 DIAGNOSIS — F331 Major depressive disorder, recurrent, moderate: Secondary | ICD-10-CM

## 2020-05-20 DIAGNOSIS — F902 Attention-deficit hyperactivity disorder, combined type: Secondary | ICD-10-CM | POA: Diagnosis not present

## 2020-05-20 DIAGNOSIS — F4323 Adjustment disorder with mixed anxiety and depressed mood: Secondary | ICD-10-CM | POA: Diagnosis not present

## 2020-05-20 MED ORDER — METHYLPHENIDATE HCL 20 MG PO TABS: 20.0000 mg | ORAL_TABLET | Freq: Three times a day (TID) | ORAL | 0 refills | Status: DC

## 2020-05-20 NOTE — Progress Notes (Signed)
Follow-up patient with recent depression and anxiety on top of his ADHD.  Has been continuing to have stress from work.  Irritable with it.  Finds that he is getting easily overwhelmed again which had been a symptom of ADHD in the past.  No suicidal or homicidal ideation no psychosis.  Patient sounded a little bit stressed but not as bad as he did a few weeks ago.  Lucid.  No disorganized thinking.  No suicidal or homicidal ideation.  Patient is wanting to try a different stimulant.  We reviewed various options but he wants to try going to a methylphenidate option.  We will try Ritalin plain 20 mg 3 times a day.  Talk again in another couple weeks and see how this is going.  No change to current antidepressant and antianxiety medicine.  Supportive therapy.  Total time on the phone 20 minutes

## 2020-06-08 ENCOUNTER — Telehealth (INDEPENDENT_AMBULATORY_CARE_PROVIDER_SITE_OTHER): Payer: 59 | Admitting: Psychiatry

## 2020-06-08 ENCOUNTER — Other Ambulatory Visit: Payer: Self-pay

## 2020-06-08 ENCOUNTER — Encounter: Payer: Self-pay | Admitting: Psychiatry

## 2020-06-08 DIAGNOSIS — F902 Attention-deficit hyperactivity disorder, combined type: Secondary | ICD-10-CM | POA: Diagnosis not present

## 2020-06-08 DIAGNOSIS — F331 Major depressive disorder, recurrent, moderate: Secondary | ICD-10-CM

## 2020-06-08 MED ORDER — AMPHETAMINE-DEXTROAMPHETAMINE 20 MG PO TABS: 20.0000 mg | ORAL_TABLET | Freq: Two times a day (BID) | ORAL | 0 refills | Status: DC

## 2020-06-08 MED ORDER — ESCITALOPRAM OXALATE 10 MG PO TABS: 10.0000 mg | ORAL_TABLET | Freq: Every day | ORAL | 5 refills | Status: DC

## 2020-06-08 NOTE — Progress Notes (Signed)
Follow-up for this 54 year old man with ADHD and recent depression.  Patient reached by telephone.  Confirmed both Savard identities.  Total time on the telephone 20 minutes.  Patient was alert oriented appropriate and on time.  He reports that his mood is significantly better.  Sleeping better.  Energy level better.  Much more positive about life and social interactions.  Not having tearfulness regularly.  No suicidal ideation.  No side effects of medicine.  On the other hand, the change in stimulant has not been working out.  Does not feel that the Ritalin has been as effective and finds that it is making him feel like he is having headaches more frequently.  Wants to switch back to Adderall.  Alert and oriented.  Affect euthymic and appropriate.  Thoughts lucid.  No loosening of associations no delusions.  Denies suicidal or homicidal ideation.  Good insight and judgment.  Supportive counseling and therapy.  Review of medicine use and side effects.  Return to Adderall 20 mg twice a day and continue Lexapro.  Follow-up 4 weeks

## 2020-07-08 ENCOUNTER — Encounter: Payer: Self-pay | Admitting: Psychiatry

## 2020-07-08 ENCOUNTER — Other Ambulatory Visit: Payer: Self-pay

## 2020-07-08 ENCOUNTER — Telehealth (INDEPENDENT_AMBULATORY_CARE_PROVIDER_SITE_OTHER): Payer: 59 | Admitting: Psychiatry

## 2020-07-08 DIAGNOSIS — F902 Attention-deficit hyperactivity disorder, combined type: Secondary | ICD-10-CM | POA: Diagnosis not present

## 2020-07-08 DIAGNOSIS — F331 Major depressive disorder, recurrent, moderate: Secondary | ICD-10-CM

## 2020-07-08 MED ORDER — AMPHETAMINE-DEXTROAMPHETAMINE 20 MG PO TABS: 20.0000 mg | ORAL_TABLET | Freq: Two times a day (BID) | ORAL | 0 refills | Status: DC

## 2020-07-08 MED ORDER — ESCITALOPRAM OXALATE 10 MG PO TABS: 10.0000 mg | ORAL_TABLET | Freq: Every day | ORAL | 1 refills | Status: DC

## 2020-07-08 NOTE — Progress Notes (Signed)
Patient reached by telephone.  Established identity of myself and the patient.  Total time on the telephone 20 minutes.  Patient reports very good news.  He has gotten a new job at [employer] that he feels much better about.  Mood is upbeat.  Lucid however.  No sign of disorganized thinking.  Sleeping well.  Alert and oriented.  Affect euthymic and upbeat.  Thoughts lucid without loosening of associations or delusions.  No suicidal or homicidal ideation.  Reviewed medicines and treatment plan.  Supportive counseling and therapy.  He is using Klonopin only rarely.  Needs refills only of Adderall and Lexapro.  Refilled medication.  Follow-up 4 weeks.

## 2020-08-09 ENCOUNTER — Other Ambulatory Visit: Payer: Self-pay | Admitting: Psychiatry

## 2020-08-09 MED ORDER — CLONAZEPAM 0.5 MG PO TABS: 0.2500 mg | ORAL_TABLET | Freq: Three times a day (TID) | ORAL | 0 refills | Status: DC | PRN

## 2020-08-09 MED ORDER — AMPHETAMINE-DEXTROAMPHET ER 20 MG PO CP24: 40.0000 mg | ORAL_CAPSULE | Freq: Every day | ORAL | 0 refills | Status: DC

## 2020-08-10 ENCOUNTER — Encounter: Payer: Self-pay | Admitting: Psychiatry

## 2020-08-10 ENCOUNTER — Other Ambulatory Visit: Payer: Self-pay

## 2020-08-10 ENCOUNTER — Telehealth (INDEPENDENT_AMBULATORY_CARE_PROVIDER_SITE_OTHER): Payer: 59 | Admitting: Psychiatry

## 2020-08-10 DIAGNOSIS — F331 Major depressive disorder, recurrent, moderate: Secondary | ICD-10-CM | POA: Diagnosis not present

## 2020-08-10 DIAGNOSIS — F902 Attention-deficit hyperactivity disorder, combined type: Secondary | ICD-10-CM | POA: Diagnosis not present

## 2020-08-10 NOTE — Progress Notes (Signed)
Virtual Visit via Telephone Note  I connected with Vincent [name] on 08/10/20 at  1:20 PM EST by telephone and verified that I am speaking with the correct person using two identifiers.  Location: Patient: Home Provider: Office hospital   I discussed the limitations, risks, security and privacy concerns of performing an evaluation and management service by telephone and the availability of in person appointments. I also discussed with the patient that there may be a patient responsible charge related to this service. The patient expressed understanding and agreed to proceed.   History of Present Illness: Reach patient by telephone.  Affect was upbeat but he had bad news.  The job he had been very excited about getting last month ended up falling through.  He learned at the last minute.  Still at his old job.  He feels however that he is taking this well.  Talked quite a bit about keeping a good attitude and the positive things in his life especially his friendships.  No health problems.    Observations/Objective: Alert and oriented.  Normal affect.  Reactive.  Thoughts lucid no loosening of associations no evidence delusions.   Assessment and Plan: Tolerating medicine well.  Sad but with good insight and good control.  No indication for changing the current medication regimen.  Refilled medicines yesterday.  Talk again in 4 weeks   Follow Up Instructions: Call back for follow-up 4 weeks    I discussed the assessment and treatment plan with the patient. The patient was provided an opportunity to ask questions and all were answered. The patient agreed with the plan and demonstrated an understanding of the instructions.   The patient was advised to call back or seek an in-person evaluation if the symptoms worsen or if the condition fails to improve as anticipated.  I provided of non-face-to-face time during this encounter.   [name], MD

## 2020-08-23 ENCOUNTER — Other Ambulatory Visit: Payer: Self-pay | Admitting: Psychiatry

## 2020-08-23 MED ORDER — AMPHETAMINE-DEXTROAMPHET ER 20 MG PO CP24: 20.0000 mg | ORAL_CAPSULE | Freq: Every day | ORAL | 0 refills | Status: DC

## 2020-09-30 ENCOUNTER — Encounter: Payer: Self-pay | Admitting: Psychiatry

## 2020-09-30 ENCOUNTER — Other Ambulatory Visit: Payer: Self-pay

## 2020-09-30 ENCOUNTER — Telehealth (INDEPENDENT_AMBULATORY_CARE_PROVIDER_SITE_OTHER): Payer: 59 | Admitting: Psychiatry

## 2020-09-30 DIAGNOSIS — F902 Attention-deficit hyperactivity disorder, combined type: Secondary | ICD-10-CM | POA: Diagnosis not present

## 2020-09-30 DIAGNOSIS — F341 Dysthymic disorder: Secondary | ICD-10-CM | POA: Diagnosis not present

## 2020-09-30 MED ORDER — AMPHETAMINE-DEXTROAMPHETAMINE 20 MG PO TABS: 20.0000 mg | ORAL_TABLET | Freq: Two times a day (BID) | ORAL | 0 refills | Status: DC

## 2020-09-30 MED ORDER — ESCITALOPRAM OXALATE 10 MG PO TABS: 10.0000 mg | ORAL_TABLET | Freq: Every day | ORAL | 5 refills | Status: DC

## 2020-09-30 MED ORDER — CLONAZEPAM 0.5 MG PO TABS: 0.2500 mg | ORAL_TABLET | Freq: Three times a day (TID) | ORAL | 0 refills | Status: DC | PRN

## 2020-09-30 NOTE — Progress Notes (Signed)
Virtual Visit via Telephone Note  I connected with Vincent [name] on 09/30/20 at  1:00 PM EST by telephone and verified that I am speaking with the correct person using two identifiers.  Location: Patient: Home Provider: Hospital   I discussed the limitations, risks, security and privacy concerns of performing an evaluation and management service by telephone and the availability of in person appointments. I also discussed with the patient that there may be a patient responsible charge related to this service. The patient expressed understanding and agreed to proceed.   History of Present Illness: Patient reached by telephone.  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  Patient has no specific new complaint.  Work is going fairly well.  He is reconciled to the temporary nature of his current job.  He had a reasonably good time over the holidays.  Currently having some cold symptoms but does not feel severely compromised.  Denies suicidal or homicidal ideation.  Denied psychotic symptoms    Observations/Objective: Alert and oriented.  Normal conversational tone.  Affect reactive and appropriate.  No loosening of associations or delusions.  Denies suicidal or homicidal ideation.   Assessment and Plan: Refill Adderall 20 mg twice a day also the rarely uses Klonopin and the Lexapro.  Follow-up 4 weeks   Follow Up Instructions: No change to current medicine follow-up 4 weeks   I discussed the assessment and treatment plan with the patient. The patient was provided an opportunity to ask questions and all were answered. The patient agreed with the plan and demonstrated an understanding of the instructions.   The patient was advised to call back or seek an in-person evaluation if the symptoms worsen or if the condition fails to improve as anticipated.  I provided 20 minutes of non-face-to-face time during this encounter.   [name], MD

## 2020-10-28 ENCOUNTER — Other Ambulatory Visit: Payer: Self-pay

## 2020-10-28 ENCOUNTER — Telehealth (INDEPENDENT_AMBULATORY_CARE_PROVIDER_SITE_OTHER): Payer: 59 | Admitting: Psychiatry

## 2020-10-28 ENCOUNTER — Encounter: Payer: Self-pay | Admitting: Psychiatry

## 2020-10-28 DIAGNOSIS — F902 Attention-deficit hyperactivity disorder, combined type: Secondary | ICD-10-CM | POA: Diagnosis not present

## 2020-10-28 DIAGNOSIS — F341 Dysthymic disorder: Secondary | ICD-10-CM | POA: Diagnosis not present

## 2020-10-28 MED ORDER — AMPHETAMINE-DEXTROAMPHET ER 20 MG PO CP24
20.0000 mg | ORAL_CAPSULE | Freq: Every day | ORAL | 0 refills | Status: DC
Start: 2020-10-28 — End: 2020-12-09

## 2020-10-28 NOTE — Progress Notes (Signed)
Virtual Visit via Telephone Note  I connected with [name] on 10/28/20 at  1:00 PM EST by telephone and verified that I am speaking with the correct person using two identifiers.  Location: Patient: Home Provider: Hospital   I discussed the limitations, risks, security and privacy concerns of performing an evaluation and management service by telephone and the availability of in person appointments. I also discussed with the patient that there may be a patient responsible charge related to this service. The patient expressed understanding and agreed to proceed.   History of Present Illness: Patient reached by telephone.  On time and expecting appointment.  Patient reviewed the last month.  He is doing quite well.  No return of major depression.  Feeling positive about himself and his work.  Work situation has actually been working out better than he thought.  He has noticed some slipping in his concentration at times and speculates that switching back to long-acting Adderall may be of some help    Observations/Objective: Lucid and clear.  Affect reactive and appropriate.  Thoughts lucid no sign of delusional thinking.  No anger problems.  No suicidal thoughts.   Assessment and Plan: I am agreeable to switching his Adderall back to the 20 mg extended release once a day.  No change to other medicine.  Reviewed overall treatment plan   Follow Up Instructions: Follow-up 4 weeks    I discussed the assessment and treatment plan with the patient. The patient was provided an opportunity to ask questions and all were answered. The patient agreed with the plan and demonstrated an understanding of the instructions.   The patient was advised to call back or seek an in-person evaluation if the symptoms worsen or if the condition fails to improve as anticipated.  I provided 20 minutes of non-face-to-face time during this encounter.   [name], MD

## 2020-11-23 ENCOUNTER — Other Ambulatory Visit: Payer: Self-pay

## 2020-11-23 ENCOUNTER — Telehealth (INDEPENDENT_AMBULATORY_CARE_PROVIDER_SITE_OTHER): Payer: 59 | Admitting: Psychiatry

## 2020-11-23 DIAGNOSIS — F341 Dysthymic disorder: Secondary | ICD-10-CM

## 2020-11-23 DIAGNOSIS — F902 Attention-deficit hyperactivity disorder, combined type: Secondary | ICD-10-CM

## 2020-11-23 MED ORDER — AMPHETAMINE-DEXTROAMPHETAMINE 20 MG PO TABS: 20.0000 mg | ORAL_TABLET | Freq: Two times a day (BID) | ORAL | 0 refills | Status: DC

## 2020-11-23 NOTE — Progress Notes (Signed)
Virtual Visit via Telephone Note  I connected with Vincent [name] on 11/23/20 at  1:20 PM EST by telephone and verified that I am speaking with the correct person using two identifiers.  Location: Patient: Home Provider: Office   I discussed the limitations, risks, security and privacy concerns of performing an evaluation and management service by telephone and the availability of in person appointments. I also discussed with the patient that there may be a patient responsible charge related to this service. The patient expressed understanding and agreed to proceed.   History of Present Illness: Reach patient by telephone.  Patient had no new complaints.  Work remains busy.  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  Attention good.  Mood good.  No complaints.  Only concern is that the long-acting Adderall did not seem to work as well as he had hoped.    Observations/Objective: Alert and oriented.  Affect euthymic and reactive.  Thoughts lucid.  No sign of dangerousness or loose associations or disorganized thinking   Assessment and Plan: Stable doing well.  Supportive counseling and encouragement.  Reviewed overall progress in his mood.  Only change will be to switch back to short acting Adderall  Follow Up Instructions: Switch back to short acting medicine.  Otherwise no other changes.  Follow-up 4 weeks    I discussed the assessment and treatment plan with the patient. The patient was provided an opportunity to ask questions and all were answered. The patient agreed with the plan and demonstrated an understanding of the instructions.   The patient was advised to call back or seek an in-person evaluation if the symptoms worsen or if the condition fails to improve as anticipated.  I provided 20 minutes of non-face-to-face time during this encounter.   [name], MD

## 2020-12-09 ENCOUNTER — Other Ambulatory Visit: Payer: Self-pay

## 2020-12-09 ENCOUNTER — Ambulatory Visit (INDEPENDENT_AMBULATORY_CARE_PROVIDER_SITE_OTHER): Payer: 59

## 2020-12-09 ENCOUNTER — Ambulatory Visit
Admission: RE | Admit: 2020-12-09 | Discharge: 2020-12-09 | Disposition: A | Discharge status: Home / Self Care | Payer: 59 | Source: Ambulatory Visit | Attending: Family Medicine | Admitting: Family Medicine

## 2020-12-09 VITALS — BP 124/100 | HR 93 | Temp 98.3°F | Resp 25 | Ht 70.0 in | Wt 145.1 lb

## 2020-12-09 DIAGNOSIS — J441 Chronic obstructive pulmonary disease with (acute) exacerbation: Secondary | ICD-10-CM

## 2020-12-09 DIAGNOSIS — J449 Chronic obstructive pulmonary disease, unspecified: Secondary | ICD-10-CM | POA: Diagnosis not present

## 2020-12-09 MED ORDER — PREDNISONE 50 MG PO TABS: ORAL_TABLET | ORAL | 0 refills | Status: DC

## 2020-12-09 MED ORDER — PROMETHAZINE-DM 6.25-15 MG/5ML PO SYRP: 5.0000 mL | ORAL_SOLUTION | Freq: Four times a day (QID) | ORAL | 0 refills | Status: DC | PRN

## 2020-12-09 MED ORDER — DOXYCYCLINE HYCLATE 100 MG PO CAPS: 100.0000 mg | ORAL_CAPSULE | Freq: Two times a day (BID) | ORAL | 0 refills | Status: DC

## 2020-12-09 MED ORDER — ALBUTEROL SULFATE HFA 108 (90 BASE) MCG/ACT IN AERS: 2.0000 | INHALATION_SPRAY | Freq: Four times a day (QID) | RESPIRATORY_TRACT | 0 refills | Status: DC | PRN

## 2020-12-09 NOTE — Discharge Instructions (Signed)
Medications as prescribed.  Local Primary Care: [facility], [facility] in [place], [place] [facility] (in front of [address]), [place] [facility].  Take care  Dr. Adriana Simas

## 2020-12-09 NOTE — ED Triage Notes (Signed)
Patient states that he has been having a cough with congestion and is spitting out mucus.

## 2020-12-09 NOTE — ED Provider Notes (Signed)
[facility]    CSN: [id number] Arrival date & time: 12/09/20  1046  History   Chief Complaint Chief Complaint  Patient presents with  . Cough   HPI  55 year old male presents with cough and chest congestion.  Started on Monday.  He reports chest tightness, cough.  His cough is productive.  Associated shortness of breath.  No fever.  Has a remote history of asthma.  He is not currently on any inhalers.  He is a former smoker.  No medications were interventions tried.  No relieving factors.  No known sick contacts.  Past Medical History:  Diagnosis Date  . ADHD (attention deficit hyperactivity disorder)   . Asthma     Patient Active Problem List   Diagnosis Date Noted  . Dysthymia 04/22/2015  . ADD (attention deficit disorder) 03/25/2015  . Anxiety 03/25/2015  . 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 CHOLECYSTECTOMY    . COLON SURGERY         Home Medications    Prior to Admission medications   Medication Sig Start Date End Date Taking? Authorizing Provider  amphetamine-dextroamphetamine (ADDERALL) 20 MG tablet Take 1 tablet (20 mg total) by mouth 2 (two) times daily. 11/23/20  Yes Clapacs, Jackquline Denmark, MD  doxycycline (VIBRAMYCIN) 100 MG capsule Take 1 capsule (100 mg total) by mouth 2 (two) times daily. 12/09/20  Yes Braydee Shimkus G, DO  predniSONE (DELTASONE) 50 MG tablet 1 tablet daily x 5 days 12/09/20  Yes Cyrena Kuchenbecker G, DO  promethazine-dextromethorphan (PROMETHAZINE-DM) 6.25-15 MG/5ML syrup Take 5 mLs by mouth 4 (four) times daily as needed for cough. 12/09/20  Yes Kaylla Cobos, Verdis Frederickson, DO  Spacer/Aero-Holding Chambers (AEROCHAMBER PLUS) inhaler Use as instructed 08/30/19  Yes Domenick Gong, MD  escitalopram (LEXAPRO) 10 MG tablet Take 1 tablet (10 mg total) by mouth daily. 09/30/20 12/09/20 Yes Clapacs, Jackquline Denmark, MD  methylphenidate (RITALIN) 20 MG tablet Take 1 tablet (20 mg  total) by mouth 3 (three) times daily. 05/20/20 12/09/20 Yes Clapacs, Jackquline Denmark, MD  albuterol (VENTOLIN HFA) 108 (90 Base) MCG/ACT inhaler Inhale 2 puffs into the lungs every 6 (six) hours as needed for wheezing or shortness of breath. 12/09/20   Tommie Sams, DO  clonazePAM (KLONOPIN) 0.5 MG tablet Take 0.5 tablets (0.25 mg total) by mouth 3 (three) times daily as needed for anxiety. 09/30/20 12/09/20  Clapacs, Jackquline Denmark, MD    Family History Family History  Problem Relation Age of Onset  . Diabetes Mother   . Alzheimer's disease Mother   . Depression Sister   . Hypertension Father     Social History Social History   Tobacco Use  . Smoking status: Former Smoker    Packs/day: 0.50  . Smokeless tobacco: Never Used  . Tobacco comment: "I quit months ago"  Vaping Use  . Vaping Use: Never used  Substance Use Topics  . Alcohol use: Yes    Comment: special - occ  . Drug use: No     Allergies   Patient has no known allergies.   Review of Systems Review of Systems  Constitutional: Negative for fever.  Respiratory: Positive for cough, chest tightness and shortness of breath.    Physical Exam Triage Vital Signs ED Triage Vitals  Enc Vitals Group     BP 12/09/20 1127 (!) 124/100     Pulse Rate 12/09/20 1127 93     Resp 12/09/20  1127 (!) 25     Temp 12/09/20 1127 98.3 F (36.8 C)     Temp Source 12/09/20 1127 Oral     SpO2 12/09/20 1127 93 %     Weight 12/09/20 1124 145 lb 1 oz (65.8 kg)     Height 12/09/20 1124 5\' 10"  (1.778 m)     Head Circumference --      Peak Flow --      Pain Score 12/09/20 1124 0     Pain Loc --      Pain Edu? --      Excl. in GC? --    Updated Vital Signs BP (!) 124/100 (BP Location: Left Arm)   Pulse 93   Temp 98.3 F (36.8 C) (Oral)   Resp (!) 25   Ht 5\' 10"  (1.778 m)   Wt 65.8 kg   SpO2 93%   BMI 20.81 kg/m   Visual Acuity Right Eye Distance:   Left Eye Distance:   Bilateral Distance:    Right Eye Near:   Left Eye Near:    Bilateral  Near:     Physical Exam Vitals and nursing note reviewed.  Constitutional:      General: He is not in acute distress.    Appearance: He is not ill-appearing.  HENT:     Head: Normocephalic and atraumatic.  Eyes:     General:        Right eye: No discharge.        Left eye: No discharge.     Conjunctiva/sclera: Conjunctivae normal.  Cardiovascular:     Rate and Rhythm: Normal rate and regular rhythm.  Pulmonary:     Effort: Pulmonary effort is normal.     Breath sounds: Normal breath sounds. No wheezing or rales.  Neurological:     Mental Status: He is alert.  Psychiatric:     Comments: Anxious.  Psychomotor agitation noted.    UC Treatments / Results  Labs (all labs ordered are listed, but only abnormal results are displayed) Labs Reviewed - No data to display  EKG   Radiology DG Chest 2 View  Result Date: 12/09/2020 CLINICAL DATA:  Cough, shortness of breath EXAM: CHEST - 2 VIEW COMPARISON:  10/26/2019 FINDINGS: There is hyperinflation of the lungs compatible with COPD. Heart and mediastinal contours are within normal limits. No focal opacities or effusions. No acute bony abnormality. IMPRESSION: COPD.  No active disease. Electronically Signed   By: 12/11/2020 M.D.   On: 12/09/2020 11:55    Procedures Procedures (including critical care time)  Medications Ordered in UC Medications - No data to display  Initial Impression / Assessment and Plan / UC Course  I have reviewed the triage vital signs and the nursing notes.  Pertinent labs & imaging results that were available during my care of the patient were reviewed by me and considered in my medical decision making (see chart for details).    55 year old male presents with COPD exacerbation.  Chest x-ray was obtained and revealed hyperinflation consistent with COPD.  He is a former smoker.  His clinical picture is consistent with COPD exacerbation.  Treating with albuterol, prednisone, doxycycline.  Promethazine DM  for cough.  Final Clinical Impressions(s) / UC Diagnoses   Final diagnoses:  COPD exacerbation [facility])     Discharge Instructions     Medications as prescribed.  Local Primary Care: [facility], [facility] in [place], [facility] (in front of [employer]), [place] [facility].  Take  care  Dr. Adriana Simas    ED Prescriptions    Medication Sig Dispense Auth. Provider   albuterol (VENTOLIN HFA) 108 (90 Base) MCG/ACT inhaler Inhale 2 puffs into the lungs every 6 (six) hours as needed for wheezing or shortness of breath. 18 g Lylah Lantis G, DO   predniSONE (DELTASONE) 50 MG tablet 1 tablet daily x 5 days 5 tablet Keddrick Wyne G, DO   promethazine-dextromethorphan (PROMETHAZINE-DM) 6.25-15 MG/5ML syrup Take 5 mLs by mouth 4 (four) times daily as needed for cough. 118 mL Jireh Vinas G, DO   doxycycline (VIBRAMYCIN) 100 MG capsule Take 1 capsule (100 mg total) by mouth 2 (two) times daily. 14 capsule Everlene Other G, DO     PDMP not reviewed this encounter.   Tommie Sams, [place] 12/09/20 1242

## 2020-12-27 ENCOUNTER — Other Ambulatory Visit: Payer: Self-pay | Admitting: Psychiatry

## 2020-12-27 MED ORDER — AMPHETAMINE-DEXTROAMPHETAMINE 20 MG PO TABS
20.0000 mg | ORAL_TABLET | Freq: Two times a day (BID) | ORAL | 0 refills | Status: DC
Start: 2020-12-27 — End: 2021-01-17

## 2020-12-28 ENCOUNTER — Other Ambulatory Visit: Payer: Self-pay

## 2020-12-28 ENCOUNTER — Telehealth: Payer: 59 | Admitting: Psychiatry

## 2020-12-31 ENCOUNTER — Other Ambulatory Visit: Payer: Self-pay | Admitting: Family Medicine

## 2021-01-04 ENCOUNTER — Other Ambulatory Visit: Payer: Self-pay

## 2021-01-04 ENCOUNTER — Telehealth: Payer: 59 | Admitting: Psychiatry

## 2021-01-17 ENCOUNTER — Other Ambulatory Visit: Payer: Self-pay | Admitting: Psychiatry

## 2021-01-17 MED ORDER — AMPHETAMINE-DEXTROAMPHETAMINE 20 MG PO TABS: 20.0000 mg | ORAL_TABLET | Freq: Two times a day (BID) | ORAL | 0 refills | Status: DC

## 2021-01-17 MED ORDER — ESCITALOPRAM OXALATE 10 MG PO TABS: 10.0000 mg | ORAL_TABLET | Freq: Every day | ORAL | 5 refills | Status: DC

## 2021-01-17 MED ORDER — CLONAZEPAM 0.5 MG PO TABS: 0.2500 mg | ORAL_TABLET | Freq: Three times a day (TID) | ORAL | 0 refills | Status: DC | PRN

## 2021-01-31 ENCOUNTER — Other Ambulatory Visit: Payer: Self-pay | Admitting: Psychiatry

## 2021-01-31 MED ORDER — ESCITALOPRAM OXALATE 10 MG PO TABS: 10.0000 mg | ORAL_TABLET | Freq: Every day | ORAL | 0 refills | Status: DC

## 2021-01-31 MED ORDER — AMPHETAMINE-DEXTROAMPHETAMINE 20 MG PO TABS: 20.0000 mg | ORAL_TABLET | Freq: Two times a day (BID) | ORAL | 0 refills | Status: DC

## 2021-01-31 MED ORDER — CLONAZEPAM 0.125 MG PO TBDP: 0.1250 mg | ORAL_TABLET | Freq: Three times a day (TID) | ORAL | 0 refills | Status: DC | PRN
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
## 2021-02-01 ENCOUNTER — Other Ambulatory Visit: Payer: Self-pay | Admitting: Psychiatry

## 2021-02-01 MED ORDER — METHYLPHENIDATE HCL ER 54 MG PO TB24: 54.0000 mg | ORAL_TABLET | Freq: Every day | ORAL | 0 refills | Status: DC

## 2021-02-08 ENCOUNTER — Other Ambulatory Visit: Payer: Self-pay

## 2021-02-08 ENCOUNTER — Telehealth (HOSPITAL_BASED_OUTPATIENT_CLINIC_OR_DEPARTMENT_OTHER): Payer: 59 | Admitting: Psychiatry

## 2021-02-08 DIAGNOSIS — F341 Dysthymic disorder: Secondary | ICD-10-CM

## 2021-02-08 DIAGNOSIS — F4323 Adjustment disorder with mixed anxiety and depressed mood: Secondary | ICD-10-CM

## 2021-02-08 DIAGNOSIS — F902 Attention-deficit hyperactivity disorder, combined type: Secondary | ICD-10-CM

## 2021-02-08 MED ORDER — AMPHETAMINE-DEXTROAMPHET ER 30 MG PO CP24: 30.0000 mg | ORAL_CAPSULE | Freq: Every day | ORAL | 0 refills | Status: DC

## 2021-02-08 NOTE — Progress Notes (Signed)
Virtual Visit via Telephone Note  I connected with [name] on 02/08/21 at  1:20 PM EDT by telephone and verified that I am speaking with the correct person using two identifiers.  Location: Patient: Home Provider: Hospital   I discussed the limitations, risks, security and privacy concerns of performing an evaluation and management service by telephone and the availability of in person appointments. I also discussed with the patient that there may be a patient responsible charge related to this service. The patient expressed understanding and agreed to proceed.   History of Present Illness: Patient reached by telephone.  Still feeling very anxious.  Filled Concerta but it seems to have been barely helpful at all.  He describes himself as feeling like he is caught in the headlights.  Feels paralyzed by the job decision he has to make.  Very anxious about it.  No suicidal thoughts no psychosis.   Observations/Objective: Sounds very nervous.  Lucid however with good insight.  We talked about the work situation and then what he is most worried about.  Tried to offer a lot of reassurance that it sounds like even in the worst situation he has done very well with work in the past.  He has a lot of skills and strengths that he was reminded of.  Assessment and Plan: We discussed medication.  He really feels that the stimulants were most helpful.  Since the pharmacy was resistant to filling a new prescription for the plain Adderall he suggested we try and Adderall extended release prescription for 30 mg instead of the Concerta which is fine with me.  He will dispense with the Concerta and get 30 mg extended release once a day filled.  We will talk again in another week.   Follow Up Instructions: Follow up in about a week.  New prescription added.  No change to other prescriptions    I discussed the assessment and treatment plan with the patient. The patient was provided an opportunity to ask  questions and all were answered. The patient agreed with the plan and demonstrated an understanding of the instructions.   The patient was advised to call back or seek an in-person evaluation if the symptoms worsen or if the condition fails to improve as anticipated.  I provided 20 minutes of non-face-to-face time during this encounter.   [name], MD

## 2021-03-15 ENCOUNTER — Other Ambulatory Visit: Payer: Self-pay

## 2021-03-15 ENCOUNTER — Telehealth (HOSPITAL_BASED_OUTPATIENT_CLINIC_OR_DEPARTMENT_OTHER): Payer: 59 | Admitting: Psychiatry

## 2021-03-15 DIAGNOSIS — F331 Major depressive disorder, recurrent, moderate: Secondary | ICD-10-CM

## 2021-03-15 DIAGNOSIS — F341 Dysthymic disorder: Secondary | ICD-10-CM

## 2021-03-15 DIAGNOSIS — F902 Attention-deficit hyperactivity disorder, combined type: Secondary | ICD-10-CM

## 2021-03-15 MED ORDER — AMPHETAMINE-DEXTROAMPHETAMINE 20 MG PO TABS: 20.0000 mg | ORAL_TABLET | Freq: Two times a day (BID) | ORAL | 0 refills | Status: DC

## 2021-03-15 MED ORDER — ESCITALOPRAM OXALATE 10 MG PO TABS: 10.0000 mg | ORAL_TABLET | Freq: Every day | ORAL | 6 refills | Status: DC

## 2021-03-16 NOTE — Progress Notes (Signed)
Virtual Visit via Telephone Note  I connected with [name] on 03/16/21 at  1:00 PM EDT by telephone and verified that I am speaking with the correct person using two identifiers.  Location: Patient: Home Provider: Hospital   I discussed the limitations, risks, security and privacy concerns of performing an evaluation and management service by telephone and the availability of in person appointments. I also discussed with the patient that there may be a patient responsible charge related to this service. The patient expressed understanding and agreed to proceed.   History of Present Illness: Patient reached by phone.  He reports that his mood recently has been better.  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  Looking for new jobs.  Not feeling nearly as panicky or overwhelmed.    Observations/Objective: Alert and oriented.  Generally euthymic.  No sign of the panicky voice he had recently.  Denies any suicidal thought   Assessment and Plan: Reviewed with the patient that is insurances apparently able to pay for the regular amphetamine again which will be represcribed.  Renew the Lexapro.  Supportive counseling and encouragement.  Plan on seeing him for follow-up by phone in another month as usual   Follow Up Instructions: Follow up in a month    I discussed the assessment and treatment plan with the patient. The patient was provided an opportunity to ask questions and all were answered. The patient agreed with the plan and demonstrated an understanding of the instructions.   The patient was advised to call back or seek an in-person evaluation if the symptoms worsen or if the condition fails to improve as anticipated.  I provided 20 minutes of non-face-to-face time during this encounter.   [name], MD

## 2021-04-05 ENCOUNTER — Other Ambulatory Visit: Payer: Self-pay | Admitting: Psychiatry

## 2021-04-05 ENCOUNTER — Other Ambulatory Visit: Payer: Self-pay

## 2021-04-05 ENCOUNTER — Telehealth (HOSPITAL_BASED_OUTPATIENT_CLINIC_OR_DEPARTMENT_OTHER): Payer: 59 | Admitting: Psychiatry

## 2021-04-05 DIAGNOSIS — F331 Major depressive disorder, recurrent, moderate: Secondary | ICD-10-CM

## 2021-04-05 DIAGNOSIS — F4323 Adjustment disorder with mixed anxiety and depressed mood: Secondary | ICD-10-CM

## 2021-04-05 DIAGNOSIS — F902 Attention-deficit hyperactivity disorder, combined type: Secondary | ICD-10-CM

## 2021-04-05 MED ORDER — METHYLPHENIDATE HCL ER 54 MG PO TB24: 54.0000 mg | ORAL_TABLET | Freq: Every day | ORAL | 0 refills | Status: DC

## 2021-04-05 MED ORDER — ALPRAZOLAM 0.25 MG PO TABS: 0.2500 mg | ORAL_TABLET | Freq: Three times a day (TID) | ORAL | 1 refills | Status: DC | PRN

## 2021-04-05 MED ORDER — ESCITALOPRAM OXALATE 10 MG PO TABS: 10.0000 mg | ORAL_TABLET | Freq: Every day | ORAL | 6 refills | Status: DC

## 2021-04-05 MED ORDER — METHYLPHENIDATE HCL ER (OSM) 36 MG PO TBCR: 36.0000 mg | EXTENDED_RELEASE_TABLET | Freq: Every day | ORAL | 0 refills | Status: DC

## 2021-04-05 MED ORDER — AMPHETAMINE-DEXTROAMPHETAMINE 20 MG PO TABS: 20.0000 mg | ORAL_TABLET | Freq: Two times a day (BID) | ORAL | 0 refills | Status: DC

## 2021-04-05 NOTE — Progress Notes (Signed)
Virtual Visit via Telephone Note  I connected with Vincent [name] on 04/05/21 at  2:00 PM EDT by telephone and verified that I am speaking with the correct person using two identifiers.  Location: Patient: Home Provider: Hospital   I discussed the limitations, risks, security and privacy concerns of performing an evaluation and management service by telephone and the availability of in person appointments. I also discussed with the patient that there may be a patient responsible charge related to this service. The patient expressed understanding and agreed to proceed.   History of Present Illness: Patient reached by telephone.  Patient was on time alert oriented and cooperative.  He is in quite a bit of distress today.  Work situation has gotten markedly worse.  Apparently he has been let go at work but in a manner that is confusing.  He is insisting that he is being terminated under false premises and is trying to pursue a case with human resources.  Meanwhile however this leaves him without the ability to apply for unemployment.  Feeling very anxious and frightened.  No report of suicidal thought.  Nervous at times but not hallucinating.  Does not appear delusional.  He does mention that he would like to change his clonazepam as it is just still too sedating for him even at the current dose.    Observations/Objective: Alert and oriented.  Affect is nervous depressed at times sounds tearful.  Mood is feeling nervous and bad.  Thoughts lucid.  Some exaggeration and jumping to conclusions but does not report any suicidal thought and does not appear psychotic   Assessment and Plan: Patient wants to try switching off of the clonazepam.  We will try alprazolam as it tends to be less somnolent.  0.25 or 0.125 mg every 8 hours as needed.  Also he is requesting adding Concerta back to help him stabilize some of his mood.  It is true that the stimulant seems to have really helped him to stabilize in the  past.  I agreed to add Concerta back which he can use as a as needed to see whether he tolerates it and hopefully it does not make him more anxious.  Initial dose of 54 mg could not be filled by the pharmacy so we changed it to 36.  Checkup again in at most 4 weeks possibly sooner.   Follow Up Instructions:    I discussed the assessment and treatment plan with the patient. The patient was provided an opportunity to ask questions and all were answered. The patient agreed with the plan and demonstrated an understanding of the instructions.   The patient was advised to call back or seek an in-person evaluation if the symptoms worsen or if the condition fails to improve as anticipated.  I provided 20 minutes of non-face-to-face time during this encounter.   [name], MD

## 2021-05-08 ENCOUNTER — Encounter: Payer: Self-pay | Admitting: Emergency Medicine

## 2021-05-08 ENCOUNTER — Other Ambulatory Visit: Payer: Self-pay

## 2021-05-08 DIAGNOSIS — F419 Anxiety disorder, unspecified: Secondary | ICD-10-CM | POA: Diagnosis not present

## 2021-05-08 DIAGNOSIS — J45909 Unspecified asthma, uncomplicated: Secondary | ICD-10-CM | POA: Diagnosis not present

## 2021-05-08 DIAGNOSIS — F32A Depression, unspecified: Secondary | ICD-10-CM | POA: Insufficient documentation

## 2021-05-08 DIAGNOSIS — Z046 Encounter for general psychiatric examination, requested by authority: Secondary | ICD-10-CM | POA: Diagnosis present

## 2021-05-08 DIAGNOSIS — Y900 Blood alcohol level of less than 20 mg/100 ml: Secondary | ICD-10-CM | POA: Diagnosis not present

## 2021-05-08 DIAGNOSIS — Z87891 Personal history of nicotine dependence: Secondary | ICD-10-CM | POA: Insufficient documentation

## 2021-05-08 DIAGNOSIS — Z79899 Other long term (current) drug therapy: Secondary | ICD-10-CM | POA: Diagnosis not present

## 2021-05-08 LAB — URINE DRUG SCREEN, QUALITATIVE (ARMC ONLY)
Amphetamines, Ur Screen: NOT DETECTED
Barbiturates, Ur Screen: NOT DETECTED
Benzodiazepine, Ur Scrn: POSITIVE — AB
Cannabinoid 50 Ng, Ur ~~LOC~~: POSITIVE — AB
Cocaine Metabolite,Ur ~~LOC~~: NOT DETECTED
MDMA (Ecstasy)Ur Screen: NOT DETECTED
Methadone Scn, Ur: NOT DETECTED
Opiate, Ur Screen: NOT DETECTED
Phencyclidine (PCP) Ur S: NOT DETECTED
Tricyclic, Ur Screen: NOT DETECTED

## 2021-05-08 LAB — COMPREHENSIVE METABOLIC PANEL
ALT: 11 U/L (ref 0–44)
AST: 17 U/L (ref 15–41)
Albumin: 4.2 g/dL (ref 3.5–5.0)
Alkaline Phosphatase: 47 U/L (ref 38–126)
Anion gap: 5 (ref 5–15)
BUN: 10 mg/dL (ref 6–20)
CO2: 30 mmol/L (ref 22–32)
Calcium: 8.8 mg/dL — ABNORMAL LOW (ref 8.9–10.3)
Chloride: 104 mmol/L (ref 98–111)
Creatinine, Ser: 0.71 mg/dL (ref 0.61–1.24)
GFR, Estimated: >60 mL/min (ref >60)
Glucose, Bld: 110 mg/dL — ABNORMAL HIGH (ref 70–99)
Potassium: 4.8 mmol/L (ref 3.5–5.1)
Sodium: 139 mmol/L (ref 135–145)
Total Bilirubin: 0.6 mg/dL (ref 0.3–1.2)
Total Protein: 6.7 g/dL (ref 6.5–8.1)

## 2021-05-08 LAB — CBC
HCT: 44.6 % (ref 39.0–52.0)
Hemoglobin: 15 g/dL (ref 13.0–17.0)
MCH: 32.3 pg (ref 26.0–34.0)
MCHC: 33.6 g/dL (ref 30.0–36.0)
MCV: 95.9 fL (ref 80.0–100.0)
Platelets: 284 10*3/uL (ref 150–400)
RBC: 4.65 MIL/uL (ref 4.22–5.81)
RDW: 13.3 % (ref 11.5–15.5)
WBC: 6 10*3/uL (ref 4.0–10.5)
nRBC: 0 % (ref 0.0–0.2)

## 2021-05-08 LAB — ETHANOL: Alcohol, Ethyl (B): <10 mg/dL (ref <10)

## 2021-05-08 NOTE — ED Triage Notes (Addendum)
Pt arrived via [facility] with IVC paperwork  per papers, pt has been dx with depression and abusing meds, he has had poor hygiene and wishes he was dead, has been aggressive towards elderly father who is afraid of him.   Pt denies any SI/HI. Denies A/V hallucinations.  Pt has been living with his sister.   Pt has hx of ADHD currently on meds, but needs refills states he is inbetween work.   Pt calm and cooperative in triage.  Pt had CBD gummy today.

## 2021-05-08 NOTE — ED Notes (Signed)
Pt dressed out in hospital provided attire with this tech and Morrie Sheldon, RN in the rm. Pt belongings consist of: a wallet, a cell phone, blue shirt, blue jeans, black belt, and black shoes. Pt denies having any money in his wallet. Pt calm and cooperative while dressing out. Pt belongings placed into one pt belonging bag with pt name.

## 2021-05-09 ENCOUNTER — Emergency Department
Admission: EM | Admit: 2021-05-09 | Discharge: 2021-05-09 | Disposition: A | Discharge status: Home / Self Care | Payer: 59 | Attending: Emergency Medicine | Admitting: Emergency Medicine

## 2021-05-09 DIAGNOSIS — F419 Anxiety disorder, unspecified: Secondary | ICD-10-CM | POA: Diagnosis present

## 2021-05-09 DIAGNOSIS — F341 Dysthymic disorder: Secondary | ICD-10-CM | POA: Diagnosis present

## 2021-05-09 DIAGNOSIS — F902 Attention-deficit hyperactivity disorder, combined type: Secondary | ICD-10-CM | POA: Diagnosis present

## 2021-05-09 DIAGNOSIS — F32A Depression, unspecified: Secondary | ICD-10-CM

## 2021-05-09 DIAGNOSIS — R4689 Other symptoms and signs involving appearance and behavior: Secondary | ICD-10-CM

## 2021-05-09 LAB — ACETAMINOPHEN LEVEL: Acetaminophen (Tylenol), Serum: <10 ug/mL — ABNORMAL LOW (ref 10–30)

## 2021-05-09 LAB — SALICYLATE LEVEL: Salicylate Lvl: <7 mg/dL — ABNORMAL LOW (ref 7.0–30.0)

## 2021-05-09 MED ORDER — ESCITALOPRAM OXALATE 10 MG PO TABS: 10.0000 mg | ORAL_TABLET | Freq: Every day | ORAL | 0 refills | Status: DC

## 2021-05-09 MED ORDER — AMPHETAMINE-DEXTROAMPHETAMINE 10 MG PO TABS: 20.0000 mg | ORAL_TABLET | Freq: Two times a day (BID) | ORAL | Status: DC

## 2021-05-09 MED ORDER — ALPRAZOLAM 0.25 MG PO TABS: 0.2500 mg | ORAL_TABLET | Freq: Three times a day (TID) | ORAL | 0 refills | Status: DC | PRN

## 2021-05-09 MED ORDER — ESCITALOPRAM OXALATE 10 MG PO TABS: 10.0000 mg | ORAL_TABLET | Freq: Every day | ORAL | Status: DC

## 2021-05-09 MED ORDER — AMPHETAMINE-DEXTROAMPHETAMINE 20 MG PO TABS: 20.0000 mg | ORAL_TABLET | Freq: Two times a day (BID) | ORAL | 0 refills | Status: DC

## 2021-05-09 MED ORDER — ALPRAZOLAM 0.5 MG PO TABS: 0.2500 mg | ORAL_TABLET | Freq: Three times a day (TID) | ORAL | Status: DC | PRN

## 2021-05-09 NOTE — ED Notes (Signed)
IVC PENDING  CONSULT ?

## 2021-05-09 NOTE — ED Notes (Signed)
Instructions also reviewed with pt's sister over the phone per pt wanting sister to know what was going on and to have the sister ask the rn any questions she may have, sister asked about prescriptions and dr Lenard Lance made aware and scripts sent to [facility]

## 2021-05-09 NOTE — Consult Note (Signed)
[facility] Psych ED Discharge  05/09/2021 1:28 PM Vincent [name]  MRN:  [id number]  Method of visit?: Face to Face   Principal Problem: Anxiety Discharge Diagnoses: Principal Problem:   Anxiety Active Problems:   ADHD (attention deficit hyperactivity disorder), combined type   Dysthymia   Subjective: "I couldn't afford my medications and was trying to make them last."  Client admitted for an increase in anxiety and family's concerns about him not taking his medications and having some aggression with more depression.  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.  He was without insurance for awhile and had issues affording these and taking them judiciously.  Moderate depression with no suicidal ideations or homicidal ideations.  He has been a patient with Dr Toni Amend for years who reports he becomes unstable when he is not on his ADHD medications.  No substance abuse history or current abuse.  His sleep is fair related to his anxiety.  He would like to get a refill of his medications so he can return to work and see his regular provider.  Stable for discharge, lives with his sister who he let know of the plan.  Total Time spent with patient: 1 hour  Past Psychiatric History: depression, anxiety, ADHD  Past Medical History:  Past Medical History:  Diagnosis Date   ADHD (attention deficit hyperactivity disorder)    Asthma     Past Surgical History:  Procedure Laterality Date   CHOLECYSTECTOMY     COLON SURGERY     Family History:  Family History  Problem Relation Age of Onset   Diabetes Mother    Alzheimer's disease Mother    Depression Sister    Hypertension Father    Family Psychiatric  History: see above Social History:  Social History   Substance and Sexual Activity  Alcohol Use Yes   Comment: special - occ     Social History   Substance and Sexual Activity  Drug Use No    Social History   Socioeconomic History   Marital status:  Married    Spouse name: Not on file   Number of children: Not on file   Years of education: Not on file   Highest education level: Not on file  Occupational History   Not on file  Tobacco Use   Smoking status: Former    Packs/day: 0.50    Types: Cigarettes   Smokeless tobacco: Never   Tobacco comments:    "I quit months ago"  Vaping Use   Vaping Use: Never used  Substance and Sexual Activity   Alcohol use: Yes    Comment: special - occ   Drug use: No   Sexual activity: Never  Other Topics Concern   Not on file  Social History Narrative   Not on file   Social Determinants of Health   Financial Resource Strain: Not on file  Food Insecurity: Not on file  Transportation Needs: Not on file  Physical Activity: Not on file  Stress: Not on file  Social Connections: Not on file    Tobacco Cessation:  N/A, patient does not currently use tobacco products  Current Medications: Current Facility-Administered Medications  Medication Dose Route Frequency Provider Last Rate Last Admin   ALPRAZolam (XANAX) tablet 0.25 mg  0.25 mg Oral TID PRN Charm Rings, NP       amphetamine-dextroamphetamine (ADDERALL) tablet 20 mg  20 mg Oral BID Charm Rings, NP  escitalopram (LEXAPRO) tablet 10 mg  10 mg Oral Daily Charm Rings, NP       Current Outpatient Medications  Medication Sig Dispense Refill   escitalopram (LEXAPRO) 10 MG tablet Take 1 tablet (10 mg total) by mouth daily for 7 days. 7 tablet 0   ALPRAZolam (XANAX) 0.25 MG tablet Take 1 tablet (0.25 mg total) by mouth 3 (three) times daily as needed for anxiety. 21 tablet 0   amphetamine-dextroamphetamine (ADDERALL) 20 MG tablet Take 1 tablet (20 mg total) by mouth 2 (two) times daily. 14 tablet 0   escitalopram (LEXAPRO) 10 MG tablet TAKE 1 TABLET BY MOUTH EVERY DAY 90 tablet 1   methylphenidate 54 MG PO CR tablet Take 54 mg by mouth daily.     PTA Medications: (Not in a hospital  admission)   Musculoskeletal: Strength & Muscle Tone: within normal limits Gait & Station: normal Patient leans: N/A  Psychiatric Specialty Exam: Physical Exam Vitals and nursing note reviewed.  Constitutional:      Appearance: Normal appearance.  HENT:     Head: Normocephalic.     Nose: Nose normal.  Pulmonary:     Effort: Pulmonary effort is normal.  Musculoskeletal:        General: Normal range of motion.     Cervical back: Normal range of motion.  Neurological:     General: No focal deficit present.     Mental Status: He is alert and oriented to person, place, and time.  Psychiatric:        Attention and Perception: Attention and perception normal.        Mood and Affect: Mood is anxious and depressed.        Speech: Speech normal.        Behavior: Behavior normal. Behavior is cooperative.        Thought Content: Thought content normal.        Cognition and Memory: Cognition and memory normal.        Judgment: Judgment normal.    Review of Systems  Psychiatric/Behavioral:  Positive for depression. The patient is nervous/anxious.   All other systems reviewed and are negative.  Blood pressure 106/79, pulse 72, temperature 98.4 F (36.9 C), temperature source Oral, resp. rate 16, height 5\' 11"  (1.803 m), weight 61.2 kg, SpO2 98 %.Body mass index is 18.83 kg/m.  General Appearance: Casual  Eye Contact:  Good  Speech:  Normal Rate  Volume:  Normal  Mood:  Anxious and Depressed  Affect:  Congruent  Thought Process:  Goal Directed  Orientation:  Full (Time, Place, and Person)  Thought Content:  WDL and Logical  Suicidal Thoughts:  No  Homicidal Thoughts:  No  Memory:  Immediate;   Good Recent;   Good Remote;   Good  Judgement:  Fair  Insight:  Good  Psychomotor Activity:  Increased  Concentration:  Concentration: Fair and Attention Span: Fair  Recall:  Good  Fund of Knowledge:  Good  Language:  Good  Akathisia:  No  Handed:  Right  AIMS (if indicated):      Assets:  Housing Leisure Time Physical Health Resilience Social Support  ADL's:  Intact  Cognition:  WNL  Sleep:         Physical Exam: Physical Exam Vitals and nursing note reviewed.  Constitutional:      Appearance: Normal appearance.  HENT:     Head: Normocephalic.     Nose: Nose normal.  Pulmonary:     Effort:  Pulmonary effort is normal.  Musculoskeletal:        General: Normal range of motion.     Cervical back: Normal range of motion.  Neurological:     General: No focal deficit present.     Mental Status: He is alert and oriented to person, place, and time.  Psychiatric:        Attention and Perception: Attention and perception normal.        Mood and Affect: Mood is anxious and depressed.        Speech: Speech normal.        Behavior: Behavior normal. Behavior is cooperative.        Thought Content: Thought content normal.        Cognition and Memory: Cognition and memory normal.        Judgment: Judgment normal.   Review of Systems  Psychiatric/Behavioral:  Positive for depression. The patient is nervous/anxious.   All other systems reviewed and are negative. Blood pressure 106/79, pulse 72, temperature 98.4 F (36.9 C), temperature source Oral, resp. rate 16, height 5\' 11"  (1.803 m), weight 61.2 kg, SpO2 98 %. Body mass index is 18.83 kg/m.   Demographic Factors:  Male and Caucasian  Loss Factors: NA  Historical Factors: NA  Risk Reduction Factors:   Sense of responsibility to family, Living with another person, especially a relative, Positive social support, and Positive therapeutic relationship  Continued Clinical Symptoms:  Anxiety and depression, moderate  Cognitive Features That Contribute To Risk:  None    Suicide Risk:  Minimal: No identifiable suicidal ideation.  Patients presenting with no risk factors but with morbid ruminations; may be classified as minimal risk based on the severity of the depressive symptoms    Plan Of  Care/Follow-up recommendations:  ADHD: -Continue Adderall 20 mg BID  Major depressive disorder, moderate: -Continue Lexapro 10 mg daily  Anxiety: -Continue Xanax 0.25 mg TID PRN Activity:  as tolerated Diet:  heart healthy diet  Disposition: discharge home , NP 05/09/2021, 1:28 PM

## 2021-05-09 NOTE — ED Provider Notes (Signed)
-----------------------------------------   11:15 AM on 05/09/2021 ----------------------------------------- The patient has been seen and evaluated by psychiatry.  They restarted the patient's ADHD medications.  Patient is feeling better, will be discharged home shortly.   Minna Antis, MD 05/09/21 1116

## 2021-05-09 NOTE — ED Notes (Signed)
IVC  RESCINDED PER  JAMIE  LORD  NP  INFORMED  BRANDY  RN

## 2021-05-09 NOTE — ED Provider Notes (Signed)
[facility] Emergency Department Provider Note  ____________________________________________   Event Date/Time   First MD Initiated Contact with Patient 05/09/21 [phone]     (approximate)  I have reviewed the triage vital signs and the nursing notes.   HISTORY  Chief Complaint Psychiatric Evaluation   HPI SHANTI EICHEL is a 55 y.o. male with a past medical history of ADHD, asthma, anxiety and depression who presents chest custody after IVC paperwork was filled out by family with concerns of patient has been abusing his Ativan and making statements about harming family numbers having worsening depression.  Patient states he has been feeling very depressed.  He denies any SI, HI or hallucinations.  Denies any illicit drug use.  States he is not sure why I see paperwork was filled out.  He denies any sick symptoms including headache or earache, sore throat, nausea, vomiting, Derica dysuria, rash or any other clear associated sick symptoms.  States at 1 point that he took his medicines today but then he minutes later states that he has not had his medicines in some time due to some insurance issues.         Past Medical History:  Diagnosis Date   ADHD (attention deficit hyperactivity disorder)    Asthma     Patient Active Problem List   Diagnosis Date Noted   Dysthymia 04/22/2015   ADD (attention deficit disorder) 03/25/2015   Anxiety 03/25/2015   ADHD (attention deficit hyperactivity disorder), combined type 02/25/2015    Past Surgical History:  Procedure Laterality Date   CHOLECYSTECTOMY     COLON SURGERY      Prior to Admission medications   Medication Sig Start Date End Date Taking? Authorizing Provider  albuterol (VENTOLIN HFA) 108 (90 Base) MCG/ACT inhaler Inhale 2 puffs into the lungs every 6 (six) hours as needed for wheezing or shortness of breath. 12/09/20   Tommie Sams, DO  ALPRAZolam Prudy Feeler) 0.25 MG tablet Take 1 tablet (0.25 mg total)  by mouth 3 (three) times daily as needed for anxiety. 04/05/21 04/05/22  Clapacs, Jackquline Denmark, MD  amphetamine-dextroamphetamine (ADDERALL XR) 30 MG 24 hr capsule Take 1 capsule (30 mg total) by mouth daily. 02/08/21 02/08/22  Clapacs, Jackquline Denmark, MD  amphetamine-dextroamphetamine (ADDERALL) 20 MG tablet Take 1 tablet (20 mg total) by mouth 2 (two) times daily. 04/05/21   Clapacs, Jackquline Denmark, MD  clonazePAM (KLONOPIN) 0.5 MG tablet Take 0.5 tablets (0.25 mg total) by mouth 3 (three) times daily as needed for anxiety. 01/17/21 01/17/22  Clapacs, Jackquline Denmark, MD  doxycycline (VIBRAMYCIN) 100 MG capsule Take 1 capsule (100 mg total) by mouth 2 (two) times daily. 12/09/20   Tommie Sams, DO  escitalopram (LEXAPRO) 10 MG tablet TAKE 1 TABLET BY MOUTH EVERY DAY 04/18/21   Clapacs, Jackquline Denmark, MD  methylphenidate (CONCERTA) 36 MG PO CR tablet Take 1 tablet (36 mg total) by mouth daily. 04/05/21   Clapacs, Jackquline Denmark, MD  predniSONE (DELTASONE) 50 MG tablet 1 tablet daily x 5 days 12/09/20   Tommie Sams, DO  promethazine-dextromethorphan (PROMETHAZINE-DM) 6.25-15 MG/5ML syrup Take 5 mLs by mouth 4 (four) times daily as needed for cough. 12/09/20   Tommie Sams, DO  Spacer/Aero-Holding Chambers (AEROCHAMBER PLUS) inhaler Use as instructed 08/30/19   Domenick Gong, MD    Allergies Patient has no known allergies.  Family History  Problem Relation Age of Onset   Diabetes Mother    Alzheimer's disease Mother  Depression Sister    Hypertension Father     Social History Social History   Tobacco Use   Smoking status: Former    Packs/day: 0.50    Types: Cigarettes   Smokeless tobacco: Never   Tobacco comments:    "I quit months ago"  Vaping Use   Vaping Use: Never used  Substance Use Topics   Alcohol use: Yes    Comment: special - occ   Drug use: No    Review of Systems  Review of Systems  Constitutional:  Negative for chills and fever.  HENT:  Negative for sore throat.   Eyes:  Negative for pain.  Respiratory:   Negative for cough and stridor.   Cardiovascular:  Negative for chest pain.  Gastrointestinal:  Negative for vomiting.  Skin:  Negative for rash.  Neurological:  Negative for seizures, loss of consciousness and headaches.  Psychiatric/Behavioral:  Positive for depression. Negative for suicidal ideas. The patient is nervous/anxious.   All other systems reviewed and are negative.    ____________________________________________   PHYSICAL EXAM:  VITAL SIGNS: ED Triage Vitals  Enc Vitals Group     BP 05/08/21 2302 95/65     Pulse Rate 05/08/21 2302 (!) 57     Resp 05/08/21 2302 18     Temp 05/08/21 2302 98.4 F (36.9 C)     Temp Source 05/08/21 2302 Oral     SpO2 05/08/21 2302 100 %     Weight 05/08/21 2303 135 lb (61.2 kg)     Height 05/08/21 2303 5\' 11"  (1.803 m)     Head Circumference --      Peak Flow --      Pain Score 05/08/21 2303 0     Pain Loc --      Pain Edu? --      Excl. in GC? --    Vitals:   05/08/21 2302  BP: 95/65  Pulse: (!) 57  Resp: 18  Temp: 98.4 F (36.9 C)  SpO2: 100%   Physical Exam Vitals and nursing note reviewed.  Constitutional:      Appearance: He is well-developed.  HENT:     Head: Normocephalic and atraumatic.     Right Ear: External ear normal.     Left Ear: External ear normal.     Nose: Nose normal.  Eyes:     Conjunctiva/sclera: Conjunctivae normal.  Cardiovascular:     Rate and Rhythm: Normal rate and regular rhythm.     Heart sounds: No murmur heard. Pulmonary:     Effort: Pulmonary effort is normal. No respiratory distress.     Breath sounds: Normal breath sounds.  Abdominal:     Palpations: Abdomen is soft.     Tenderness: There is no abdominal tenderness.  Musculoskeletal:     Cervical back: Neck supple.  Skin:    General: Skin is warm and dry.  Neurological:     Mental Status: He is alert and oriented to person, place, and time.  Psychiatric:        Mood and Affect: Mood is anxious and depressed.        Thought  Content: Thought content does not include homicidal or suicidal ideation.     ____________________________________________   LABS (all labs ordered are listed, but only abnormal results are displayed)  Labs Reviewed  COMPREHENSIVE METABOLIC PANEL - Abnormal; Notable for the following components:      Result Value   Glucose, Bld 110 (*)    Calcium  8.8 (*)    All other components within normal limits  SALICYLATE LEVEL - Abnormal; Notable for the following components:   Salicylate Lvl <7.0 (*)    All other components within normal limits  ACETAMINOPHEN LEVEL - Abnormal; Notable for the following components:   Acetaminophen (Tylenol), Serum <10 (*)    All other components within normal limits  URINE DRUG SCREEN, QUALITATIVE ([facility] ONLY) - Abnormal; Notable for the following components:   Cannabinoid 50 Ng, Ur [place] POSITIVE (*)    Benzodiazepine, Ur Scrn POSITIVE (*)    All other components within normal limits  RESP PANEL BY RT-PCR (FLU A&B, COVID) ARPGX2  ETHANOL  CBC   ____________________________________________  EKG  ____________________________________________  RADIOLOGY  ED MD interpretation:    Official radiology report(s): No results found.  ____________________________________________   PROCEDURES  Procedure(s) performed (including Critical Care):  Procedures   ____________________________________________   INITIAL IMPRESSION / ASSESSMENT AND PLAN / ED COURSE      Patient presents after IVC paperwork was filled out by family with concerns the patient had worsening depression and had making threatening statements towards family members and not take his medicine appropriately.  Patient denies any SI HI states he does not belong with family but denies any threatening statements.  He denies any other acute concerns at this time.  However it is somewhat unclear if he has been taking his medicines regularly as 1 point states he has not had them in some time due  to insurance issues but then later states he took them earlier today.  He does not appear to be otherwise actively psychotic or intoxicated.  He is afebrile hemodynamically stable on arrival.  Routine psychiatric screening labs sent in triage including ethanol salicylates and acetaminophen undetectable.  CMP and CBC unremarkable.  UDS positive for cannabinoids and benzos.  Psychiatry TTS consulted.  The patient has been placed in psychiatric observation due to the need to provide a safe environment for the patient while obtaining psychiatric consultation and evaluation, as well as ongoing medical and medication management to treat the patient's condition.  The patient has been placed under full IVC at this time.        ____________________________________________   FINAL CLINICAL IMPRESSION(S) / ED DIAGNOSES  Final diagnoses:  Depression, unspecified depression type  Behavior concern    Medications - No data to display   ED Discharge Orders     None        Note:  This document was prepared using Dragon voice recognition software and may include unintentional dictation errors.    Gilles Chiquito, MD 05/09/21 [phone]

## 2021-05-09 NOTE — ED Notes (Signed)
Gave food tray with juice. 

## 2021-05-09 NOTE — BH Assessment (Signed)
Comprehensive Clinical Assessment (CCA) Note  05/09/2021 Vincent [name] [id number]  Vincent [name], 55 year old male who presents to [facility] ED involuntarily for treatment. Per triage note, Pt arrived via [facility] with IVC paperwork, pt has been dx with depression and abusing meds, he has had poor hygiene and wishes he was dead, has been aggressive towards elderly father who is afraid of him. Pt denies any SI/HI. Denies A/V hallucinations. Pt has been living with his sister. Pt has hx of ADHD currently on meds, but needs refills states he is in-between work. Pt calm and cooperative in triage. Pt had CBD gummy today.   During TTS assessment pt presents alert and oriented x 4, restless but cooperative, and mood-congruent with affect. The pt does not appear to be responding to internal or external stimuli. Neither is the pt presenting with any delusional thinking. Pt verified the information provided to triage RN.   Pt identifies his main complaint to be that work was incredibly stressful. Patient also mentioned he was getting low on his medications, and he was having to stretch them out so they would last. Patient states he was not able to afford to get refills because he did not have insurance at the time. Patient states he becomes quite anxious when he is not taking his meds regularly. Pt denies alcohol use. Patient reports he is feeling better after getting some rest. Pt denies SI/HI/AH/VH. Pt able to contract for safety.   Per Marijean Niemann, NP, patient does not meet criteria for inpatient psychiatric admission.    Chief Complaint:  Chief Complaint  Patient presents with   Psychiatric Evaluation   Visit Diagnosis: Anxiety    CCA Screening, Triage and Referral (STR)  Patient Reported Information How did you hear about [place]? Family/Friend  Referral name: No data recorded Referral phone number: No data recorded  Whom do you see for routine medical problems? No data recorded Practice/Facility Name: No data  recorded Practice/Facility Phone Number: No data recorded Name of Contact: No data recorded Contact Number: No data recorded Contact Fax Number: No data recorded Prescriber Name: No data recorded Prescriber Address (if known): No data recorded  What Is the Reason for Your Visit/Call Today? Patient was brought to the ED because of his anxiety and not taking his meds regularly.  How Long Has This Been Causing You Problems? <Week  What Do You Feel Would Help You the Most Today? Stress Management; Medication(s)   Have You Recently Been in Any Inpatient Treatment (Hospital/Detox/Crisis Center/28-Day Program)? No data recorded Name/Location of Program/Hospital:No data recorded How Long Were You There? No data recorded When Were You Discharged? No data recorded  Have You Ever Received Services From [facility] Before? No data recorded Who Do You See at [facility]? No data recorded  Have You Recently Had Any Thoughts About Hurting Yourself? No  Are You Planning to Commit Suicide/Harm Yourself At This time? No   Have you Recently Had Thoughts About Hurting Someone Vincent [name]? No  Explanation: No data recorded  Have You Used Any Alcohol or Drugs in the Past 24 Hours? Yes  How Long Ago Did You Use Drugs or Alcohol? No data recorded What Did You Use and How Much? CBD gummy   Do You Currently Have a Therapist/Psychiatrist? Yes  Name of Therapist/Psychiatrist: No data recorded  Have You Been Recently Discharged From Any Office Practice or Programs? No  Explanation of Discharge From Practice/Program: No data recorded    CCA Screening Triage Referral Assessment Type  of Contact: Face-to-Face  Is this Initial or Reassessment? No data recorded Date Telepsych consult ordered in [facility]:  No data recorded Time Telepsych consult ordered in [facility]:  No data recorded  Patient Reported Information Reviewed? No data recorded Patient Left Without Being Seen? No data recorded Reason for Not  Completing Assessment: No data recorded  Collateral Involvement: None provided   Does Patient Have a Court Appointed Legal Guardian? No data recorded Name and Contact of Legal Guardian: No data recorded If Minor and Not Living with Parent(s), Who has Custody? n/a  Is CPS involved or ever been involved? Never  Is APS involved or ever been involved? Never   Patient Determined To Be At Risk for Harm To Self or Others Based on Review of Patient Reported Information or Presenting Complaint? No  Method: No data recorded Availability of Means: No data recorded Intent: No data recorded Notification Required: No data recorded Additional Information for Danger to Others Potential: No data recorded Additional Comments for Danger to Others Potential: No data recorded Are There Guns or Other Weapons in Your Home? No data recorded Types of Guns/Weapons: No data recorded Are These Weapons Safely Secured?                            No data recorded Who Could Verify You Are Able To Have These Secured: No data recorded Do You Have any Outstanding Charges, Pending Court Dates, Parole/Probation? No data recorded Contacted To Inform of Risk of Harm To Self or Others: No data recorded  Location of Assessment: [facility] ED   Does Patient Present under Involuntary Commitment? No  IVC Papers Initial File Date: No data recorded  [place] of Residence: [place]   Patient Currently Receiving the Following Services: Medication Management; Individual Therapy   Determination of Need: Urgent (48 hours)   Options For Referral: Outpatient Therapy; Medication Management      Recommendations for Services/Supports/Treatments:    DSM5 Diagnoses: Patient Active Problem List   Diagnosis Date Noted   Dysthymia 04/22/2015   Anxiety 03/25/2015   ADHD (attention deficit hyperactivity disorder), combined type 02/25/2015    Patient Centered Plan: Patient is on the following Treatment Plan(s):   Anxiety   Referrals to Alternative Service(s): Referred to Alternative Service(s):   Place:   Date:   Time:    Referred to Alternative Service(s):   Place:   Date:   Time:    Referred to Alternative Service(s):   Place:   Date:   Time:    Referred to Alternative Service(s):   Place:   Date:   Time:     Arda Keadle Dierdre Searles, Counselor, LCAS-A

## 2021-05-09 NOTE — Discharge Instructions (Addendum)
You have been seen in the emergency department for a  psychiatric concern. You have been evaluated both medically as well as psychiatrically. Please follow-up with your outpatient resources provided. Return to the emergency department for any worsening symptoms, or any thoughts of hurting yourself or anyone else so that we may attempt to help you. 

## 2021-06-21 ENCOUNTER — Telehealth (INDEPENDENT_AMBULATORY_CARE_PROVIDER_SITE_OTHER): Payer: 59 | Admitting: Psychiatry

## 2021-06-21 ENCOUNTER — Other Ambulatory Visit: Payer: Self-pay

## 2021-06-21 DIAGNOSIS — F902 Attention-deficit hyperactivity disorder, combined type: Secondary | ICD-10-CM | POA: Diagnosis not present

## 2021-06-21 DIAGNOSIS — F331 Major depressive disorder, recurrent, moderate: Secondary | ICD-10-CM

## 2021-06-21 MED ORDER — ESCITALOPRAM OXALATE 10 MG PO TABS: 10.0000 mg | ORAL_TABLET | Freq: Every day | ORAL | 1 refills | Status: DC

## 2021-06-21 MED ORDER — AMPHETAMINE-DEXTROAMPHETAMINE 20 MG PO TABS: 20.0000 mg | ORAL_TABLET | Freq: Two times a day (BID) | ORAL | 0 refills | Status: DC

## 2021-06-21 NOTE — Progress Notes (Signed)
Virtual Visit via Telephone Note  I connected with Vincent [name] on 06/21/21 at  1:20 PM EDT by telephone and verified that I am speaking with the correct person using two identifiers.  Location: Patient: Workplace Provider: Hospital   I discussed the limitations, risks, security and privacy concerns of performing an evaluation and management service by telephone and the availability of in person appointments. I also discussed with the patient that there may be a patient responsible charge related to this service. The patient expressed understanding and agreed to proceed.   History of Present Illness: Reach patient by telephone and established identities.  Follow-up virtual telephone visit with 55 year old man with ADHD and depression.  Patient reports he is finally feeling better.  We talked about the long spell of anxiety and depression he went through during his job change and with the stress of his last job.  We discussed his time in the emergency room.  He tells me now that what had happened was that he had mistakenly taken too many THC "Gummies" and that is what brought him into the emergency room.  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  Mood is upbeat.  Concentration is stable.  Sounds lucid.  No indication nor statement of any suicidal ideation or confusion    Observations/Objective: Affect and speech all sounds normal.  Upbeat.  Reactive.  Appropriate lucid intelligent.  No report of any suicidal thought.  Clear insight and understanding of planning for the future   Assessment and Plan: Seems to be back to his baseline and is now taking just the usual dose of Adderall and 10 mg Lexapro.  Renew prescriptions for medicine follow-up 30 days.  Supportive counseling all completed as well as reviewing plan   Follow Up Instructions:    I discussed the assessment and treatment plan with the patient. The patient was provided an opportunity  to ask questions and all were answered. The patient agreed with the plan and demonstrated an understanding of the instructions.   The patient was advised to call back or seek an in-person evaluation if the symptoms worsen or if the condition fails to improve as anticipated.  I provided 20 minutes of non-face-to-face time during this encounter.   [name], MD

## 2021-08-02 ENCOUNTER — Other Ambulatory Visit: Payer: Self-pay

## 2021-08-02 ENCOUNTER — Ambulatory Visit (INDEPENDENT_AMBULATORY_CARE_PROVIDER_SITE_OTHER): Payer: 59 | Admitting: Psychiatry

## 2021-08-02 DIAGNOSIS — F902 Attention-deficit hyperactivity disorder, combined type: Secondary | ICD-10-CM

## 2021-08-02 DIAGNOSIS — F331 Major depressive disorder, recurrent, moderate: Secondary | ICD-10-CM

## 2021-08-02 MED ORDER — AMPHETAMINE-DEXTROAMPHETAMINE 20 MG PO TABS: 20.0000 mg | ORAL_TABLET | Freq: Two times a day (BID) | ORAL | 0 refills | Status: DC

## 2021-08-02 NOTE — Progress Notes (Signed)
Virtual Visit via Telephone Note  I connected with [name] on 08/02/21 at  1:00 PM EST by telephone and verified that I am speaking with the correct person using two identifiers.  Location: Patient: Work Provider: Hospital   I discussed the limitations, risks, security and privacy concerns of performing an evaluation and management service by telephone and the availability of in person appointments. I also discussed with the patient that there may be a patient responsible charge related to this service. The patient expressed understanding and agreed to proceed.   History of Present Illness: Patient reached by telephone.  Identities established.  Patient has no new complaints.  Wants to talk about appreciating his new job.  Work is going well.  He is realizing how much better he functions and how much clearer he thinks when he is working and staying on a regular schedule.  Sleeping well.  No physical complaints.  Medications appear to be continuing to go well.  No return of depression    Observations/Objective: Alert and oriented.  Normal affect.  Normal thought patterns.  No suicidal ideation expressed at all.  Alert and oriented.  Making good decisions   Assessment and Plan: Refilled Adderall.  Continue Lexapro.  No other medication at this time.  Follow-up next month.   Follow Up Instructions:    I discussed the assessment and treatment plan with the patient. The patient was provided an opportunity to ask questions and all were answered. The patient agreed with the plan and demonstrated an understanding of the instructions.   The patient was advised to call back or seek an in-person evaluation if the symptoms worsen or if the condition fails to improve as anticipated.  I provided 20 minutes of non-face-to-face time during this encounter.   [name], MD

## 2021-08-10 ENCOUNTER — Ambulatory Visit: Payer: Self-pay

## 2021-10-04 ENCOUNTER — Other Ambulatory Visit: Payer: Self-pay

## 2021-10-04 ENCOUNTER — Telehealth (INDEPENDENT_AMBULATORY_CARE_PROVIDER_SITE_OTHER): Payer: Self-pay | Admitting: Psychiatry

## 2021-10-04 DIAGNOSIS — F902 Attention-deficit hyperactivity disorder, combined type: Secondary | ICD-10-CM

## 2021-10-04 DIAGNOSIS — F331 Major depressive disorder, recurrent, moderate: Secondary | ICD-10-CM

## 2021-10-04 MED ORDER — ESCITALOPRAM OXALATE 10 MG PO TABS: 10.0000 mg | ORAL_TABLET | Freq: Every day | ORAL | 1 refills | Status: DC

## 2021-10-04 MED ORDER — AMPHETAMINE-DEXTROAMPHETAMINE 20 MG PO TABS: 20.0000 mg | ORAL_TABLET | Freq: Two times a day (BID) | ORAL | 0 refills | Status: DC

## 2021-10-04 NOTE — Progress Notes (Signed)
Virtual Visit via Telephone Note  I connected with Vincent [name] on 10/04/21 at  2:20 PM EST by telephone and verified that I am speaking with the correct person using two identifiers.  Location: Patient: Work Provider: Hospital   I discussed the limitations, risks, security and privacy concerns of performing an evaluation and management service by telephone and the availability of in person appointments. I also discussed with the patient that there may be a patient responsible charge related to this service. The patient expressed understanding and agreed to proceed.   History of Present Illness: Patient reached by telephone.  Established identities.  Patient has no new complaints.  He says he is feeling very good and that he is job is going well.  Mood is upbeat.  Sleeping well.  Eating well.  Patient ran out of his Adderall and is a little bit scattered but does not sound like he is having any psychotic or bizarre symptoms.  No evidence of mania.    Observations/Objective: Alert and oriented.  Normal speech.  Affect upbeat.  Thoughts lucid.  Assessment and Plan: Reviewed medication and continue Adderall and S-Citalopram.  Follow up in 1 month  Follow Up Instructions:    I discussed the assessment and treatment plan with the patient. The patient was provided an opportunity to ask questions and all were answered. The patient agreed with the plan and demonstrated an understanding of the instructions.   The patient was advised to call back or seek an in-person evaluation if the symptoms worsen or if the condition fails to improve as anticipated.  I provided 20 minutes of non-face-to-face time during this encounter.   [name], MD

## 2021-10-15 ENCOUNTER — Other Ambulatory Visit: Payer: Self-pay

## 2021-10-15 ENCOUNTER — Ambulatory Visit
Admission: RE | Admit: 2021-10-15 | Discharge: 2021-10-15 | Disposition: A | Discharge status: Home / Self Care | Payer: Self-pay | Source: Ambulatory Visit | Attending: Internal Medicine | Admitting: Internal Medicine

## 2021-10-15 VITALS — BP 143/91 | HR 85 | Temp 98.4°F | Resp 18 | Ht 71.0 in | Wt 134.9 lb

## 2021-10-15 DIAGNOSIS — J441 Chronic obstructive pulmonary disease with (acute) exacerbation: Secondary | ICD-10-CM

## 2021-10-15 DIAGNOSIS — J069 Acute upper respiratory infection, unspecified: Secondary | ICD-10-CM

## 2021-10-15 MED ORDER — GUAIFENESIN-CODEINE 100-6.33 MG/5ML PO SOLN: 5.0000 mL | Freq: Every evening | ORAL | 0 refills | Status: DC

## 2021-10-15 MED ORDER — ALBUTEROL SULFATE (2.5 MG/3ML) 0.083% IN NEBU
2.5000 mg | INHALATION_SOLUTION | Freq: Once | RESPIRATORY_TRACT | Status: AC
  Administered 2021-10-15: 2.5 mg via RESPIRATORY_TRACT

## 2021-10-15 MED ORDER — ALBUTEROL SULFATE HFA 108 (90 BASE) MCG/ACT IN AERS: 2.0000 | INHALATION_SPRAY | RESPIRATORY_TRACT | 0 refills | Status: DC | PRN

## 2021-10-15 MED ORDER — PREDNISONE 20 MG PO TABS: 20.0000 mg | ORAL_TABLET | Freq: Every day | ORAL | 0 refills | Status: DC

## 2021-10-15 NOTE — ED Provider Notes (Signed)
[facility]    CSN: [id number] Arrival date & time: 10/15/21  0846      History   Chief Complaint Chief Complaint  Patient presents with   Nasal Congestion   Cough    HPI Vincent [name] is a 56 y.o. male who presents with URI symptoms x 4 days. Denies body aches or fever. Did a home covid test and was negative. Has been taking Mucinex and Tylenol. The cough is keeping him awake at night due to cough attacks that starts at 11 pm til 3 am. Gets sweats during the cough spells. He has  been a smoker for 14 years, and has not smoked at all this week. Denies fever. Feels fatigued. His cough is productive with clear mucous.     Past Medical History:  Diagnosis Date   ADHD (attention deficit hyperactivity disorder)    Asthma     Patient Active Problem List   Diagnosis Date Noted   Dysthymia 04/22/2015   Anxiety 03/25/2015   ADHD (attention deficit hyperactivity disorder), combined type 02/25/2015    Past Surgical History:  Procedure Laterality Date   CHOLECYSTECTOMY     COLON SURGERY         Home Medications    Prior to Admission medications   Medication Sig Start Date End Date Taking? Authorizing Provider  albuterol (VENTOLIN HFA) 108 (90 Base) MCG/ACT inhaler Inhale 2 puffs into the lungs every 4 (four) hours as needed for wheezing or shortness of breath. 10/15/21  Yes Rodriguez-Southworth, Nettie Elm, PA-C  amphetamine-dextroamphetamine (ADDERALL) 20 MG tablet Take 1 tablet (20 mg total) by mouth 2 (two) times daily at 10 AM and 5 PM. 10/04/21  Yes Clapacs, Jackquline Denmark, MD  guaiFENesin-Codeine 100-6.33 MG/5ML SOLN Take 5 mLs by mouth at bedtime. 10/15/21  Yes Rodriguez-Southworth, Nettie Elm, PA-C  predniSONE (DELTASONE) 20 MG tablet Take 1 tablet (20 mg total) by mouth daily with breakfast. 10/15/21  Yes Rodriguez-Southworth, Nettie Elm, PA-C  escitalopram (LEXAPRO) 10 MG tablet Take 1 tablet (10 mg total) by mouth daily. 10/04/21   Clapacs, Jackquline Denmark, MD    Family  History Family History  Problem Relation Age of Onset   Diabetes Mother    Alzheimer's disease Mother    Depression Sister    Hypertension Father     Social History Social History   Tobacco Use   Smoking status: Former    Packs/day: 0.50    Types: Cigarettes   Smokeless tobacco: Never   Tobacco comments:    "I quit months ago"  Vaping Use   Vaping Use: Never used  Substance Use Topics   Alcohol use: Yes    Comment: special - occ   Drug use: No     Allergies   Patient has no known allergies.   Review of Systems Review of Systems  Constitutional:  Positive for fatigue. Negative for chills, diaphoresis and fever.  HENT:  Positive for postnasal drip and rhinorrhea. Negative for congestion, ear discharge, ear pain and sore throat.   Respiratory:  Positive for cough, choking and shortness of breath.   Musculoskeletal:  Negative for myalgias.  Skin:  Negative for rash.  Neurological:  Negative for headaches.  Hematological:  Negative for adenopathy.    Physical Exam Triage Vital Signs ED Triage Vitals  Enc Vitals Group     BP 10/15/21 0901 (!) 143/91     Pulse Rate 10/15/21 0901 85     Resp 10/15/21 0901 18     Temp 10/15/21 0901  98.4 F (36.9 C)     Temp Source 10/15/21 0901 Oral     SpO2 10/15/21 0901 97 %     Weight 10/15/21 0858 134 lb 14.7 oz (61.2 kg)     Height 10/15/21 0858 5\' 11"  (1.803 m)     Head Circumference --      Peak Flow --      Pain Score 10/15/21 0858 0     Pain Loc --      Pain Edu? --      Excl. in GC? --    No data found.  Updated Vital Signs BP (!) 143/91 (BP Location: Right Arm)    Pulse 85    Temp 98.4 F (36.9 C) (Oral)    Resp 18    Ht 5\' 11"  (1.803 m)    Wt 134 lb 14.7 oz (61.2 kg)    SpO2 97%    BMI 18.82 kg/m   Visual Acuity Right Eye Distance:   Left Eye Distance:   Bilateral Distance:    Right Eye Near:   Left Eye Near:    Bilateral Near:     Physical Exam Constitutional:      General: He is not in acute  distress.    Appearance: He is not toxic-appearing.  HENT:     Head: Normocephalic.     Right Ear: Tympanic membrane, ear canal and external ear normal.     Left Ear: Ear canal and external ear normal.     Nose: Nose normal.     Mouth/Throat:     Mouth: Mucous membranes are moist.     Pharynx: Oropharynx is clear.  Eyes:     General: No scleral icterus.    Conjunctiva/sclera: Conjunctivae normal.  Cardiovascular:     Rate and Rhythm: Normal rate and regular rhythm.     Heart sounds: No murmur heard.   Pulmonary:     Effort: Pulmonary effort is normal. Deep breaths provoked cough    Breath sounds: Wheezing present. But resolved after albuterol neb.       Musculoskeletal:        General: Normal range of motion.     Cervical back: Neck supple.  Lymphadenopathy:     Cervical: No cervical adenopathy.  Skin:    General: Skin is warm and dry.     Findings: No rash.  Neurological:     Mental Status: He is alert and oriented to person, place, and time.     Gait: Gait normal.  Psychiatric:        Mood and Affect: Mood normal.        Behavior: Behavior normal.        Thought Content: Thought content normal.        Judgment: Judgment normal.    UC Treatments / Results  Labs (all labs ordered are listed, but only abnormal results are displayed) Labs Reviewed - No data to display  EKG   Radiology No results found.  Procedures Procedures (including critical care time)  Medications Ordered in UC Medications  albuterol (PROVENTIL) (2.5 MG/3ML) 0.083% nebulizer solution 2.5 mg (2.5 mg Nebulization Given 10/15/21 0940)    Initial Impression / Assessment and Plan / UC Course  I have reviewed the triage vital signs and the nursing notes. Pertinent labs past imaging ( CXR) results that were available during my care of the patient were reviewed by me and considered in my medical decision making (see chart for details) which showed COPD URI with  COPD exacerbation. I placed him on  Prednisone, Albuterol inhaler and Robitusin AC as noted.  Needs to get established with a PCP and be placed on daily inhaler for his COPD Advised to not restart smoking. See instructions    Final Clinical Impressions(s) / UC Diagnoses   Final diagnoses:  Viral URI with cough  COPD exacerbation [facility](HCC)     Discharge Instructions      Come back if you dont improve in 48-72 hours.      ED Prescriptions     Medication Sig Dispense Auth. Provider   predniSONE (DELTASONE) 20 MG tablet Take 1 tablet (20 mg total) by mouth daily with breakfast. 5 tablet Rodriguez-Southworth, Nettie ElmSylvia, PA-C   albuterol (VENTOLIN HFA) 108 (90 Base) MCG/ACT inhaler Inhale 2 puffs into the lungs every 4 (four) hours as needed for wheezing or shortness of breath. 18 g Rodriguez-Southworth, Kristin Barcus, PA-C   guaiFENesin-Codeine 100-6.33 MG/5ML SOLN Take 5 mLs by mouth at bedtime. 120 mL Rodriguez-Southworth, Nettie ElmSylvia, PA-C      I have reviewed the PDMP during this encounter.   Garey HamRodriguez-Southworth, Deron Poole, PA-C 10/15/21 1005

## 2021-10-15 NOTE — Discharge Instructions (Addendum)
Come back if you dont improve in 48-72 hours.

## 2021-10-15 NOTE — ED Triage Notes (Addendum)
Pt c/o nasal congestion, cough. Started about 4 days ago. Denies fever, body aches. He states he has not been able to sleep due to the cough. He states he took a home covid test and was negative. He has been taking mucinex, tylenol. Declines covid testing.

## 2021-11-22 ENCOUNTER — Telehealth (INDEPENDENT_AMBULATORY_CARE_PROVIDER_SITE_OTHER): Payer: Self-pay | Admitting: Psychiatry

## 2021-11-22 ENCOUNTER — Other Ambulatory Visit: Payer: Self-pay

## 2021-11-22 DIAGNOSIS — F331 Major depressive disorder, recurrent, moderate: Secondary | ICD-10-CM

## 2021-11-22 DIAGNOSIS — F902 Attention-deficit hyperactivity disorder, combined type: Secondary | ICD-10-CM

## 2021-11-22 DIAGNOSIS — F4323 Adjustment disorder with mixed anxiety and depressed mood: Secondary | ICD-10-CM

## 2021-11-22 MED ORDER — AMPHETAMINE-DEXTROAMPHETAMINE 20 MG PO TABS: 20.0000 mg | ORAL_TABLET | Freq: Two times a day (BID) | ORAL | 0 refills | Status: DC

## 2021-11-22 NOTE — Progress Notes (Signed)
Virtual Visit via Telephone Note  I connected with [name] on 11/22/21 at  1:20 PM EST by telephone and verified that I am speaking with the correct person using two identifiers.  Location: Patient: Work Provider: Hospital   I discussed the limitations, risks, security and privacy concerns of performing an evaluation and management service by telephone and the availability of in person appointments. I also discussed with the patient that there may be a patient responsible charge related to this service. The patient expressed understanding and agreed to proceed.   History of Present Illness: Patient was reached by telephone.  Identities established.  Patient mood is feeling pretty good.  Job has been okay but recently there have been layoffs and he is feeling that there is a likelihood he may get laid off soon.  He is trying to prepare for this in advance to avoid some of the stress.  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  Focus has been pretty good mood has been pretty good.  He does raise some concerns about his memory however.  Mother and uncle both had earlier onset Alzheimer's disease and he is concerned about this.  Has a few examples of short-term memory lapses.    Observations/Objective: Alert and oriented euthymic.  Thoughts lucid.  No loosening of associations no delusions.  No suicidal or dangerous thinking   Assessment and Plan: I will inquire of some neurological colleagues whether there are screening resources available in the community for memory concerns.  Renew medicines for now.  Follow up in 1 month   Follow Up Instructions:    I discussed the assessment and treatment plan with the patient. The patient was provided an opportunity to ask questions and all were answered. The patient agreed with the plan and demonstrated an understanding of the instructions.   The patient was advised  to call back or seek an in-person evaluation if the symptoms worsen or if the condition fails to improve as anticipated.  I provided 20 minutes of non-face-to-face time during this encounter.   [name], MD

## 2021-12-20 ENCOUNTER — Other Ambulatory Visit: Payer: Self-pay

## 2021-12-20 ENCOUNTER — Telehealth (INDEPENDENT_AMBULATORY_CARE_PROVIDER_SITE_OTHER): Payer: Self-pay | Admitting: Psychiatry

## 2021-12-20 DIAGNOSIS — F902 Attention-deficit hyperactivity disorder, combined type: Secondary | ICD-10-CM

## 2021-12-20 DIAGNOSIS — F331 Major depressive disorder, recurrent, moderate: Secondary | ICD-10-CM

## 2021-12-20 MED ORDER — AMPHETAMINE-DEXTROAMPHETAMINE 20 MG PO TABS: 20.0000 mg | ORAL_TABLET | Freq: Two times a day (BID) | ORAL | 0 refills | Status: DC

## 2021-12-20 NOTE — Progress Notes (Signed)
Virtual Visit via Telephone Note ? ?I connected with [name] on 12/20/21 at  2:00 PM EDT by telephone and verified that I am speaking with the correct person using two identifiers. ? ?Location: ?Patient: Home ?Provider: Hospital ?  ?I discussed the limitations, risks, security and privacy concerns of performing an evaluation and management service by telephone and the availability of in person appointments. I also discussed with the patient that there may be a patient responsible charge related to this service. The patient expressed understanding and agreed to proceed. ? ? ?History of Present Illness: Patient reached by telephone.  Identities established.  Patient had no new complaints.  Reported he has actually been feeling quite good.  He describes himself as having been "selfish" but acknowledges that is not really the right word for it.  He has been making an effort to try and take care of his own physical and mental health.  Mood is good.  No sign of mania or unrealistic thinking.  No suicidal ideation.  Medicine seems to be working well. ? ?  ?Observations/Objective: Alert and oriented lucid clear thinking.  No sign of disorganized thinking.  No suicidal thought. ? ? ?Assessment and Plan: Renewed medicine.  Supportive counseling and therapy.  Review plan.  Follow-up month ? ? ?Follow Up Instructions: ? ?  ?I discussed the assessment and treatment plan with the patient. The patient was provided an opportunity to ask questions and all were answered. The patient agreed with the plan and demonstrated an understanding of the instructions. ?  ?The patient was advised to call back or seek an in-person evaluation if the symptoms worsen or if the condition fails to improve as anticipated. ? ?I provided 20 minutes of non-face-to-face time during this encounter. ? ? ?[name], MD  ?

## 2022-01-08 ENCOUNTER — Emergency Department: Payer: Self-pay

## 2022-01-08 ENCOUNTER — Observation Stay: Payer: Self-pay

## 2022-01-08 ENCOUNTER — Inpatient Hospital Stay
Admission: EM | Admit: 2022-01-08 | Discharge: 2022-01-10 | DRG: 390 | Disposition: A | Discharge status: Home / Self Care | Payer: Self-pay | Attending: Internal Medicine | Admitting: Internal Medicine

## 2022-01-08 DIAGNOSIS — R935 Abnormal findings on diagnostic imaging of other abdominal regions, including retroperitoneum: Secondary | ICD-10-CM | POA: Diagnosis present

## 2022-01-08 DIAGNOSIS — Z87891 Personal history of nicotine dependence: Secondary | ICD-10-CM

## 2022-01-08 DIAGNOSIS — R7401 Elevation of levels of liver transaminase levels: Secondary | ICD-10-CM | POA: Diagnosis present

## 2022-01-08 DIAGNOSIS — R748 Abnormal levels of other serum enzymes: Secondary | ICD-10-CM | POA: Diagnosis present

## 2022-01-08 DIAGNOSIS — K838 Other specified diseases of biliary tract: Secondary | ICD-10-CM | POA: Diagnosis present

## 2022-01-08 DIAGNOSIS — R109 Unspecified abdominal pain: Secondary | ICD-10-CM | POA: Diagnosis present

## 2022-01-08 DIAGNOSIS — K8689 Other specified diseases of pancreas: Secondary | ICD-10-CM | POA: Diagnosis present

## 2022-01-08 DIAGNOSIS — F32A Depression, unspecified: Secondary | ICD-10-CM | POA: Diagnosis present

## 2022-01-08 DIAGNOSIS — J449 Chronic obstructive pulmonary disease, unspecified: Secondary | ICD-10-CM | POA: Diagnosis present

## 2022-01-08 DIAGNOSIS — R948 Abnormal results of function studies of other organs and systems: Secondary | ICD-10-CM | POA: Diagnosis present

## 2022-01-08 DIAGNOSIS — R7989 Other specified abnormal findings of blood chemistry: Secondary | ICD-10-CM | POA: Diagnosis present

## 2022-01-08 DIAGNOSIS — Z7952 Long term (current) use of systemic steroids: Secondary | ICD-10-CM

## 2022-01-08 DIAGNOSIS — R1084 Generalized abdominal pain: Secondary | ICD-10-CM

## 2022-01-08 DIAGNOSIS — K56609 Unspecified intestinal obstruction, unspecified as to partial versus complete obstruction: Principal | ICD-10-CM | POA: Diagnosis present

## 2022-01-08 DIAGNOSIS — F419 Anxiety disorder, unspecified: Secondary | ICD-10-CM | POA: Diagnosis present

## 2022-01-08 DIAGNOSIS — K439 Ventral hernia without obstruction or gangrene: Secondary | ICD-10-CM | POA: Diagnosis present

## 2022-01-08 DIAGNOSIS — Z9049 Acquired absence of other specified parts of digestive tract: Secondary | ICD-10-CM

## 2022-01-08 DIAGNOSIS — Z79899 Other long term (current) drug therapy: Secondary | ICD-10-CM

## 2022-01-08 DIAGNOSIS — F902 Attention-deficit hyperactivity disorder, combined type: Secondary | ICD-10-CM | POA: Diagnosis present

## 2022-01-08 LAB — COMPREHENSIVE METABOLIC PANEL
ALT: 126 U/L — ABNORMAL HIGH (ref 0–44)
AST: 66 U/L — ABNORMAL HIGH (ref 15–41)
Albumin: 4.4 g/dL (ref 3.5–5.0)
Alkaline Phosphatase: 56 U/L (ref 38–126)
Anion gap: 7 (ref 5–15)
BUN: 10 mg/dL (ref 6–20)
CO2: 30 mmol/L (ref 22–32)
Calcium: 9.3 mg/dL (ref 8.9–10.3)
Chloride: 100 mmol/L (ref 98–111)
Creatinine, Ser: 0.9 mg/dL (ref 0.61–1.24)
GFR, Estimated: >60 mL/min (ref >60)
Glucose, Bld: 142 mg/dL — ABNORMAL HIGH (ref 70–99)
Potassium: 4.4 mmol/L (ref 3.5–5.1)
Sodium: 137 mmol/L (ref 135–145)
Total Bilirubin: 0.9 mg/dL (ref 0.3–1.2)
Total Protein: 7.4 g/dL (ref 6.5–8.1)

## 2022-01-08 LAB — CBC WITH DIFFERENTIAL/PLATELET
Abs Immature Granulocytes: 0.02 10*3/uL (ref 0.00–0.07)
Basophils Absolute: 0.1 10*3/uL (ref 0.0–0.1)
Basophils Relative: 1 %
Eosinophils Absolute: 0.1 10*3/uL (ref 0.0–0.5)
Eosinophils Relative: 1 %
HCT: 45 % (ref 39.0–52.0)
Hemoglobin: 14.8 g/dL (ref 13.0–17.0)
Immature Granulocytes: 0 %
Lymphocytes Relative: 10 %
Lymphs Abs: 0.9 10*3/uL (ref 0.7–4.0)
MCH: 30.1 pg (ref 26.0–34.0)
MCHC: 32.9 g/dL (ref 30.0–36.0)
MCV: 91.6 fL (ref 80.0–100.0)
Monocytes Absolute: 0.4 10*3/uL (ref 0.1–1.0)
Monocytes Relative: 4 %
Neutro Abs: 7.8 10*3/uL — ABNORMAL HIGH (ref 1.7–7.7)
Neutrophils Relative %: 84 %
Platelets: 323 10*3/uL (ref 150–400)
RBC: 4.91 MIL/uL (ref 4.22–5.81)
RDW: 13.1 % (ref 11.5–15.5)
WBC: 9.3 10*3/uL (ref 4.0–10.5)
nRBC: 0 % (ref 0.0–0.2)

## 2022-01-08 LAB — GAMMA GT: GGT: 29 U/L (ref 7–50)

## 2022-01-08 LAB — LIPASE, BLOOD: Lipase: 27 U/L (ref 11–51)

## 2022-01-08 MED ORDER — ACETAMINOPHEN 650 MG RE SUPP: 650.0000 mg | Freq: Four times a day (QID) | RECTAL | Status: DC | PRN

## 2022-01-08 MED ORDER — SODIUM CHLORIDE 0.9 % IV BOLUS
1000.0000 mL | Freq: Once | INTRAVENOUS | Status: AC
Start: 2022-01-08 — End: 2022-01-08
  Administered 2022-01-08: 1000 mL via INTRAVENOUS

## 2022-01-08 MED ORDER — PANTOPRAZOLE SODIUM 40 MG IV SOLR
40.0000 mg | Freq: Two times a day (BID) | INTRAVENOUS | Status: DC
Start: 2022-01-08 — End: 2022-01-10
  Administered 2022-01-08 – 2022-01-09 (×3): 40 mg via INTRAVENOUS
  Filled 2022-01-08 (×3): qty 10

## 2022-01-08 MED ORDER — ALBUTEROL SULFATE (2.5 MG/3ML) 0.083% IN NEBU: 2.5000 mg | INHALATION_SOLUTION | RESPIRATORY_TRACT | Status: DC | PRN

## 2022-01-08 MED ORDER — ALBUTEROL SULFATE HFA 108 (90 BASE) MCG/ACT IN AERS: 2.0000 | INHALATION_SPRAY | RESPIRATORY_TRACT | Status: DC | PRN

## 2022-01-08 MED ORDER — MORPHINE SULFATE (PF) 4 MG/ML IV SOLN
4.0000 mg | Freq: Once | INTRAVENOUS | Status: AC
  Administered 2022-01-08: 4 mg via INTRAVENOUS
  Filled 2022-01-08: qty 1

## 2022-01-08 MED ORDER — LORAZEPAM 2 MG/ML IJ SOLN
1.0000 mg | Freq: Once | INTRAMUSCULAR | Status: AC
  Administered 2022-01-08: 1 mg via INTRAVENOUS
  Filled 2022-01-08: qty 1

## 2022-01-08 MED ORDER — ACETAMINOPHEN 325 MG PO TABS: 650.0000 mg | ORAL_TABLET | Freq: Four times a day (QID) | ORAL | Status: DC | PRN

## 2022-01-08 MED ORDER — AMPHETAMINE-DEXTROAMPHETAMINE 10 MG PO TABS
20.0000 mg | ORAL_TABLET | Freq: Two times a day (BID) | ORAL | Status: DC
  Administered 2022-01-09 – 2022-01-10 (×2): 20 mg via ORAL
  Filled 2022-01-08 (×2): qty 2

## 2022-01-08 MED ORDER — ONDANSETRON HCL 4 MG/2ML IJ SOLN
4.0000 mg | Freq: Once | INTRAMUSCULAR | Status: AC
Start: 2022-01-08 — End: 2022-01-08
  Administered 2022-01-08: 4 mg via INTRAVENOUS
  Filled 2022-01-08: qty 2

## 2022-01-08 MED ORDER — IOHEXOL 300 MG/ML  SOLN
100.0000 mL | Freq: Once | INTRAMUSCULAR | Status: AC | PRN
  Administered 2022-01-08: 100 mL via INTRAVENOUS

## 2022-01-08 MED ORDER — SODIUM CHLORIDE 0.9 % IV SOLN: INTRAVENOUS | Status: AC

## 2022-01-08 MED ORDER — HEPARIN SODIUM (PORCINE) 5000 UNIT/ML IJ SOLN
5000.0000 [IU] | Freq: Three times a day (TID) | INTRAMUSCULAR | Status: DC
  Administered 2022-01-08 – 2022-01-10 (×5): 5000 [IU] via SUBCUTANEOUS
  Filled 2022-01-08 (×5): qty 1

## 2022-01-08 MED ORDER — ESCITALOPRAM OXALATE 10 MG PO TABS
10.0000 mg | ORAL_TABLET | Freq: Every day | ORAL | Status: DC
  Administered 2022-01-10: 10 mg via ORAL
  Filled 2022-01-08: qty 1

## 2022-01-08 NOTE — ED Notes (Signed)
Radiology at bedside to obtain xray

## 2022-01-08 NOTE — ED Notes (Signed)
Upon arrival to the patient's room, found patient to have untaped his NG tube from his gown where I had put it and he was picking at the tape across his nose. I reminded him that was all that was holding his NG tube in and that if he accidentally pulled it out, he would have to have it put back in. Patient apologized, put his hands down and went back to picking shortly thereafter. ?

## 2022-01-08 NOTE — H&P (Signed)
?History and Physical  ? ? ?Patient: Vincent [name]:[id number] DOB: [birth year] ?DOA: 01/08/2022 ?DOS: the patient was seen and examined on 01/08/2022 ?PCP: Dr.Clapacs. ?Patient coming from: Home ? ?Chief Complaint:  ?Chief Complaint  ?Patient presents with  ? Abdominal Pain  ? ?HPI: Vincent [name] is a 56 y.o. male with medical history significant of ADHD, asthma and history of colectomy for diverticulitis presenting with abdominal pain nausea vomiting some dizziness patient states all his symptoms started today.  He denies any other symptoms and does not report any headaches blurred vision chest pain palpitations shortness of breath speech or gait issues.  States that he is had a colon surgery and I guess this is the reason he is having the small bowel obstruction.  Patient sees Dr. Toni Amend as his primary care as well as a psychiatrist. ? ?Review of Systems: Review of Systems  ?Gastrointestinal:  Positive for abdominal pain, diarrhea, nausea and vomiting.  ?Neurological:  Positive for dizziness.  ?All other systems reviewed and are negative. ? ?Past Medical History:  ?Diagnosis Date  ? ADHD (attention deficit hyperactivity disorder)   ? Asthma   ? ?Past Surgical History:  ?Procedure Laterality Date  ? CHOLECYSTECTOMY    ? COLON SURGERY    ? ?Social History:  reports that he has quit smoking. He smoked an average of .5 packs per day. He has never used smokeless tobacco. He reports current alcohol use. He reports that he does not use drugs. ? ?No Known Allergies ? ?Family History  ?Problem Relation Age of Onset  ? Diabetes Mother   ? Alzheimer's disease Mother   ? Depression Sister   ? Hypertension Father   ? ? ?Prior to Admission medications   ?Medication Sig Start Date End Date Taking? Authorizing Provider  ?albuterol (VENTOLIN HFA) 108 (90 Base) MCG/ACT inhaler Inhale 2 puffs into the lungs every 4 (four) hours as needed for wheezing or shortness of breath. 10/15/21   Rodriguez-Southworth, Nettie Elm, PA-C   ?amphetamine-dextroamphetamine (ADDERALL) 20 MG tablet Take 1 tablet (20 mg total) by mouth 2 (two) times daily at 10 AM and 5 PM. 12/20/21   Clapacs, Jackquline Denmark, MD  ?escitalopram (LEXAPRO) 10 MG tablet Take 1 tablet (10 mg total) by mouth daily. 10/04/21   Clapacs, Jackquline Denmark, MD  ?guaiFENesin-Codeine 100-6.33 MG/5ML SOLN Take 5 mLs by mouth at bedtime. 10/15/21   Rodriguez-Southworth, Nettie Elm, PA-C  ?predniSONE (DELTASONE) 20 MG tablet Take 1 tablet (20 mg total) by mouth daily with breakfast. 10/15/21   Rodriguez-Southworth, Nettie Elm, PA-C  ? ? ?Physical Exam: ?Vitals:  ? 01/08/22 1934 01/08/22 1939 01/08/22 2000 01/08/22 2100  ?BP:   105/81 118/80  ?Pulse:   60 78  ?Resp:      ?Temp:      ?TempSrc:      ?SpO2:   95% 95%  ?Weight: 56.7 kg 56.7 kg    ?Height: 5\' 11"  (1.803 m) 5\' 11"  (1.803 m)    ?Physical Exam ?Vitals and nursing note reviewed.  ?Constitutional:   ?   General: He is not in acute distress. ?   Appearance: He is underweight. He is not ill-appearing, toxic-appearing or diaphoretic.  ?HENT:  ?   Head: Normocephalic and atraumatic.  ?   Right Ear: Hearing and external ear normal.  ?   Left Ear: Hearing and external ear normal.  ?   Nose: Nose normal. No nasal deformity.  ?   Mouth/Throat:  ?   Lips: Pink.  ?   Mouth:  Mucous membranes are moist.  ?   Tongue: No lesions.  ?   Pharynx: Oropharynx is clear.  ?Eyes:  ?   Extraocular Movements: Extraocular movements intact.  ?   Pupils: Pupils are equal, round, and reactive to light.  ?Neck:  ?   Vascular: No carotid bruit.  ?Cardiovascular:  ?   Rate and Rhythm: Normal rate and regular rhythm.  ?   Pulses: Normal pulses.  ?   Heart sounds: Normal heart sounds.  ?Pulmonary:  ?   Effort: Pulmonary effort is normal.  ?   Breath sounds: Normal breath sounds.  ?Abdominal:  ?   General: Bowel sounds are decreased. There is no distension.  ?   Palpations: Abdomen is soft. There is no mass.  ?   Tenderness: There is abdominal tenderness. There is guarding.  ?   Hernia: No  hernia is present.  ? ? ?   Comments: Generalized abdominal pain that radiates to the left flank of the right flank to his back.  Intermittent.  Patient does not note any worsening or alleviating factors.  ?Musculoskeletal:  ?   Right lower leg: No edema.  ?   Left lower leg: No edema.  ?Skin: ?   General: Skin is warm.  ?Neurological:  ?   General: No focal deficit present.  ?   Mental Status: He is alert and oriented to person, place, and time.  ?   Cranial Nerves: Cranial nerves 2-12 are intact.  ?   Motor: Motor function is intact.  ?Psychiatric:     ?   Attention and Perception: Attention normal.     ?   Mood and Affect: Mood normal.     ?   Speech: Speech normal.     ?   Behavior: Behavior normal. Behavior is cooperative.     ?   Cognition and Memory: Cognition normal.  ? ? ?Data Reviewed: ?Results for orders placed or performed during the hospital encounter of 01/08/22 (from the past 24 hour(s))  ?CBC with Differential     Status: Abnormal  ? Collection Time: 01/08/22  7:53 PM  ?Result Value Ref Range  ? WBC 9.3 4.0 - 10.5 K/uL  ? RBC 4.91 4.22 - 5.81 MIL/uL  ? Hemoglobin 14.8 13.0 - 17.0 g/dL  ? HCT 45.0 39.0 - 52.0 %  ? MCV 91.6 80.0 - 100.0 fL  ? MCH 30.1 26.0 - 34.0 pg  ? MCHC 32.9 30.0 - 36.0 g/dL  ? RDW 13.1 11.5 - 15.5 %  ? Platelets 323 150 - 400 K/uL  ? nRBC 0.0 0.0 - 0.2 %  ? Neutrophils Relative % 84 %  ? Neutro Abs 7.8 (H) 1.7 - 7.7 K/uL  ? Lymphocytes Relative 10 %  ? Lymphs Abs 0.9 0.7 - 4.0 K/uL  ? Monocytes Relative 4 %  ? Monocytes Absolute 0.4 0.1 - 1.0 K/uL  ? Eosinophils Relative 1 %  ? Eosinophils Absolute 0.1 0.0 - 0.5 K/uL  ? Basophils Relative 1 %  ? Basophils Absolute 0.1 0.0 - 0.1 K/uL  ? Immature Granulocytes 0 %  ? Abs Immature Granulocytes 0.02 0.00 - 0.07 K/uL  ?Comprehensive metabolic panel     Status: Abnormal  ? Collection Time: 01/08/22  7:53 PM  ?Result Value Ref Range  ? Sodium 137 135 - 145 mmol/L  ? Potassium 4.4 3.5 - 5.1 mmol/L  ? Chloride 100 98 - 111 mmol/L  ? CO2 30  22 - 32 mmol/L  ?  Glucose, Bld 142 (H) 70 - 99 mg/dL  ? BUN 10 6 - 20 mg/dL  ? Creatinine, Ser 0.90 0.61 - 1.24 mg/dL  ? Calcium 9.3 8.9 - 10.3 mg/dL  ? Total Protein 7.4 6.5 - 8.1 g/dL  ? Albumin 4.4 3.5 - 5.0 g/dL  ? AST 66 (H) 15 - 41 U/L  ? ALT 126 (H) 0 - 44 U/L  ? Alkaline Phosphatase 56 38 - 126 U/L  ? Total Bilirubin 0.9 0.3 - 1.2 mg/dL  ? GFR, Estimated >60 >60 mL/min  ? Anion gap 7 5 - 15  ?Lipase, blood     Status: None  ? Collection Time: 01/08/22  7:53 PM  ?Result Value Ref Range  ? Lipase 27 11 - 51 U/L  ? ? ?>>> CT abdomen and pelvis with contrast:  ?IMPRESSION: ?1. Segmental dilation of the distal jejunum and ileum, which may ?reflect early or intermittent obstruction. Serial radiographic ?follow-up is recommended. ?2. Small fat containing left supraumbilical ventral hernia. No bowel ?herniation. ?3. Nonspecific biliary and pancreatic duct dilation, with no ?evidence of obstructing mass or choledocholithiasis. This was ?present to a lesser degree on the prior exam and is nonspecific. ? ? >>> Ultrasound of the abdomen: ? ?Pending ?Assessment and Plan: ?* Abdominal pain ?. ? ?SBO (small bowel obstruction) (HCC) ?. ? ?Abnormal CT of the abdomen ?. ? ?Elevated LFTs ?. ? ?ADHD (attention deficit hyperactivity disorder), combined type ?. ? ?Anxiety ?. ? ?History of tobacco abuse ?. ? ? ?>> Abdominal pain/small bowel obstruction: ?Attribute patient's acute onset abdominal pain with developing small bowel obstruction. ?We will admit patient to telemetry unit with cardiac monitoring. ?We will follow electrolytes and replace. ?NG tube placed in the emergency room feels relief after it. ?General surgery consult per a.m. team as deemed appropriate. ?N.p.o., maintenance IV fluids overnight, IV PPI.  As patient takes [place] powders. ? ? ?>> Abnormal LFTs/abnormal CT scan of the abdomen: ?Imaging shows dilated biliary and pancreatic ?Ultrasound of the abdomen is pending. ?GI versus general surgery consult as deemed  appropriate. ? ? ?>> Anxiety: ?We will continue patient's Lexapro. ? ? ?>> ADHD: ?We will continue patient's Adderall. ? ?>> History of tobacco abuse/history of COPD per patient. ?As needed Ventolin as needed. ? ? ? ?

## 2022-01-08 NOTE — ED Notes (Addendum)
NG tube placed in right nare. Patient tolerated well after extensive explanation provided along with Ativan. Patient explained that he will now be NPO and will need to stay above 45 degrees. ?

## 2022-01-08 NOTE — ED Notes (Addendum)
This nurse arrived to patients room to find that he had removed the tape holding his NG tube in place on his nose had been removed and the tape holding the tubing to his gown also had been removed. Patient was asleep. Patient was woken up and NG placement was assessed and put back in. Will notify xray for repeat placement xray. Patient refuses to remain sitting upright and continues to lie flat. He asks for something to drink. ?

## 2022-01-08 NOTE — ED Provider Notes (Signed)
? ?[facility] ?Provider Note ? ? ? Event Date/Time  ? First MD Initiated Contact with Patient 01/08/22 1945   ?  (approximate) ? ? ?History  ? ?Abdominal Pain ? ? ?HPI ? ?Vincent [name] is a 56 y.o. male who presents to the emergency department today because of concerns for abdominal pain.  Patient states that it started this afternoon shortly after eating a sandwich.  The pain is located throughout his abdomen however primarily centered in the middle.  The patient states that it does shoot.  Patient will positional.  He has had multiple episodes of nausea and vomiting.  He thought he might gain some relief from the vomiting although it did not happen.  He denies any blood in his vomit.  He does state that he has history of diverticulitis and had to have part of his sigmoid colon removed a couple of decades ago.  He denies any fevers.  States he has not been able to pass gas since the pain started. ? ?  ? ? ?Physical Exam  ? ?Triage Vital Signs: ?ED Triage Vitals  ?Enc Vitals Group  ?   BP 01/08/22 1933 138/78  ?   Pulse Rate 01/08/22 1933 81  ?   Resp 01/08/22 1933 20  ?   Temp 01/08/22 1933 98.2 ?F (36.8 ?C)  ?   Temp Source 01/08/22 1933 Oral  ?   SpO2 01/08/22 1933 96 %  ?   Weight 01/08/22 1934 125 lb (56.7 kg)  ?   Height 01/08/22 1934 5\' 11"  (1.803 m)  ?   Head Circumference --   ?   Peak Flow --   ?   Pain Score 01/08/22 1939 8  ?   Pain Loc --   ?   Pain Edu? --   ?   Excl. in [place]? --   ? ? ?Most recent vital signs: ?Vitals:  ? 01/08/22 1933  ?BP: 138/78  ?Pulse: 81  ?Resp: 20  ?Temp: 98.2 ?F (36.8 ?C)  ?SpO2: 96%  ? ? ?General: Awake, no distress.  ?CV:  Good peripheral perfusion. Regular rate and rhythm. ?Resp:  Normal effort. Lungs clear to auscultation ?Abd:  No distention. Diffusely tender to palpation.  ? ? ?ED Results / Procedures / Treatments  ? ?Labs ?(all labs ordered are listed, but only abnormal results are displayed) ?Labs Reviewed  ?CBC WITH DIFFERENTIAL/PLATELET -  Abnormal; Notable for the following components:  ?    Result Value  ? Neutro Abs 7.8 (*)   ? All other components within normal limits  ?COMPREHENSIVE METABOLIC PANEL - Abnormal; Notable for the following components:  ? Glucose, Bld 142 (*)   ? AST 66 (*)   ? ALT 126 (*)   ? All other components within normal limits  ?LIPASE, BLOOD  ?URINALYSIS, ROUTINE W REFLEX MICROSCOPIC  ? ? ? ?EKG ? ?None ? ? ?RADIOLOGY ?I independently interpreted and visualized the CT ab/pel. My interpretation: No free air. Concern for SBO ?Radiology interpretation:  ?IMPRESSION:  ?1. Segmental dilation of the distal jejunum and ileum, which may  ?reflect early or intermittent obstruction. Serial radiographic  ?follow-up is recommended.  ?2. Small fat containing left supraumbilical ventral hernia. No bowel  ?herniation.  ?3. Nonspecific biliary and pancreatic duct dilation, with no  ?evidence of obstructing mass or choledocholithiasis. This was  ?present to a lesser degree on the prior exam and is nonspecific.  ? ? ?PROCEDURES: ? ?Critical Care performed: No ? ?Procedures ? ? ?  MEDICATIONS ORDERED IN ED: ?Medications - No data to display ? ? ?IMPRESSION / MDM / ASSESSMENT AND PLAN / ED COURSE  ?I reviewed the triage vital signs and the nursing notes. ?             ?               ? ?Differential diagnosis includes, but is not limited to, colitis, gastroenteritis, SBO. ? ?Patient presents to the emergency department today because of concerns for abdominal pain.  Patient states that he has a history of diverticular colitis and had part of his sigmoid colon removed because of that.  On exam he does have some diffuse abdominal tenderness.  Afebrile here in the emergency department.  CT scan was obtained which is consistent with bowel obstruction.  I discussed this finding with the patient.  Will place NG tube.  Discussed with Dr. Posey Pronto with the hospitalist service who will plan on admission. ? ? ?FINAL CLINICAL IMPRESSION(S) / ED DIAGNOSES   ? ?Final diagnoses:  ?Small bowel obstruction ([place])  ? ? ? ?Rx / DC Orders  ? ?ED Discharge Orders   ? ? None  ? ?  ? ? ? ?Note:  This document was prepared using Dragon voice recognition software and may include unintentional dictation errors. ? ?  ?Nance Pear, MD ?01/08/22 2159 ? ?

## 2022-01-08 NOTE — ED Triage Notes (Addendum)
PT endorsing abdominal pain starting at 1400 today after lunch. Pt ate grilled cheese and dr pepper. Pt has been vomiting, with no diarrhea. Pt last BM last night. Last BM was loose. PT endorsing pain generalized to abdomen. PT able to burp but unable to pass flatus at this time. ?

## 2022-01-09 ENCOUNTER — Other Ambulatory Visit: Payer: Self-pay

## 2022-01-09 ENCOUNTER — Encounter: Payer: Self-pay | Admitting: Internal Medicine

## 2022-01-09 ENCOUNTER — Inpatient Hospital Stay: Payer: Self-pay

## 2022-01-09 ENCOUNTER — Observation Stay: Payer: Self-pay

## 2022-01-09 DIAGNOSIS — K56609 Unspecified intestinal obstruction, unspecified as to partial versus complete obstruction: Secondary | ICD-10-CM | POA: Diagnosis present

## 2022-01-09 DIAGNOSIS — R935 Abnormal findings on diagnostic imaging of other abdominal regions, including retroperitoneum: Secondary | ICD-10-CM

## 2022-01-09 LAB — CBC
HCT: 39.4 % (ref 39.0–52.0)
Hemoglobin: 13.1 g/dL (ref 13.0–17.0)
MCH: 30.4 pg (ref 26.0–34.0)
MCHC: 33.2 g/dL (ref 30.0–36.0)
MCV: 91.4 fL (ref 80.0–100.0)
Platelets: 266 10*3/uL (ref 150–400)
RBC: 4.31 MIL/uL (ref 4.22–5.81)
RDW: 13.3 % (ref 11.5–15.5)
WBC: 6.1 10*3/uL (ref 4.0–10.5)
nRBC: 0 % (ref 0.0–0.2)

## 2022-01-09 LAB — COMPREHENSIVE METABOLIC PANEL
ALT: 94 U/L — ABNORMAL HIGH (ref 0–44)
AST: 46 U/L — ABNORMAL HIGH (ref 15–41)
Albumin: 3.4 g/dL — ABNORMAL LOW (ref 3.5–5.0)
Alkaline Phosphatase: 42 U/L (ref 38–126)
Anion gap: 6 (ref 5–15)
BUN: 10 mg/dL (ref 6–20)
CO2: 28 mmol/L (ref 22–32)
Calcium: 8.2 mg/dL — ABNORMAL LOW (ref 8.9–10.3)
Chloride: 105 mmol/L (ref 98–111)
Creatinine, Ser: 0.75 mg/dL (ref 0.61–1.24)
GFR, Estimated: >60 mL/min (ref >60)
Glucose, Bld: 107 mg/dL — ABNORMAL HIGH (ref 70–99)
Potassium: 3.8 mmol/L (ref 3.5–5.1)
Sodium: 139 mmol/L (ref 135–145)
Total Bilirubin: 0.8 mg/dL (ref 0.3–1.2)
Total Protein: 5.8 g/dL — ABNORMAL LOW (ref 6.5–8.1)

## 2022-01-09 LAB — URINALYSIS, ROUTINE W REFLEX MICROSCOPIC
Bilirubin Urine: NEGATIVE
Glucose, UA: NEGATIVE mg/dL
Hgb urine dipstick: NEGATIVE
Ketones, ur: NEGATIVE mg/dL
Leukocytes,Ua: NEGATIVE
Nitrite: NEGATIVE
Protein, ur: NEGATIVE mg/dL
Specific Gravity, Urine: >1.046 — ABNORMAL HIGH (ref 1.005–1.030)
pH: 6 (ref 5.0–8.0)

## 2022-01-09 MED ORDER — LORAZEPAM 2 MG/ML IJ SOLN
0.5000 mg | Freq: Once | INTRAMUSCULAR | Status: AC | PRN
  Administered 2022-01-09: 0.5 mg via INTRAVENOUS
  Filled 2022-01-09: qty 1

## 2022-01-09 MED ORDER — HALOPERIDOL LACTATE 5 MG/ML IJ SOLN
5.0000 mg | Freq: Four times a day (QID) | INTRAMUSCULAR | Status: DC | PRN
  Administered 2022-01-09: 5 mg via INTRAMUSCULAR
  Filled 2022-01-09: qty 1

## 2022-01-09 MED ORDER — GADOBUTROL 1 MMOL/ML IV SOLN
5.0000 mL | Freq: Once | INTRAVENOUS | Status: AC | PRN
  Administered 2022-01-09: 5 mL via INTRAVENOUS

## 2022-01-09 MED ORDER — LORAZEPAM 2 MG/ML IJ SOLN: 0.5000 mg | Freq: Once | INTRAMUSCULAR | Status: DC

## 2022-01-09 NOTE — Progress Notes (Addendum)
NG tube clamped by RN at 1248 ? ?Clamped NG tube at 12:48pm. Reattached suction now. Suctioned approx 60ml with trial.  ? ?MD confirmed okay to remove NG tube.  ?

## 2022-01-09 NOTE — H&P (Signed)
?  ?Wyline Mood , MD ?[address], [place], [place], [address] ?[address], [address], [place], [place], [address] ?Phone: [phone]  ?Fax: [phone] ? Consultation ? ?Referring Provider:    Dr Myriam Forehand ?Primary Care Physician:  Pcp, No ?Primary Gastroenterologist:  None          ?Reason for Consultation:     Bowel obstruction ? ?Date of Admission:  01/08/2022 ?Date of Consultation:  01/09/2022 ?       ? HPI:   ?Vincent [name] is a 56 y.o. male with a prior medical history of ADHD, asthma and history of had colon surgery for diverticulitis.  Came into the emergency room with abdominal pain.  In the ER he underwent a CT scan of the abdomen which showed segmental dilation of the distal jejunum and ileum which may suggest early or intermittent obstruction.  Small left supraumbilical ventral hernia noted.  Nonspecific biliary and pancreatic ductal dilation with no evidence of obstructing mass or choledocholithiasis.  Noted on prior evaluation and is nonspecific.  NG tube was placed in the ER. ? ?On admission elevated AST at 66 and ALT at 126.  Alkaline phosphatase of 56 and bilirubin of 0.9.,  GT not elevated.  29.  This morning LFTs had improved.  CBC normal.  Right upper quadrant ultrasound shows biliary ductal dilation which seems increased from CT abdomen and pelvis no gallstones or ductal defect noted.  The CBD was 12 mm. ? ?The patient denies any abdominal pain at this point of time.  States he had abdominal pain prior to hospital admission.  No similar complaints in the past.  He states he is having adequate bowel movements and passing gas.  Denies any unintentional weight loss. ? ?Past Medical History:  ?Diagnosis Date  ? ADHD (attention deficit hyperactivity disorder)   ? Asthma   ? ? ?Past Surgical History:  ?Procedure Laterality Date  ? CHOLECYSTECTOMY    ? COLON SURGERY    ? ? ?Prior to Admission medications   ?Medication Sig Start Date End Date Taking? Authorizing Provider  ?albuterol  (VENTOLIN HFA) 108 (90 Base) MCG/ACT inhaler Inhale 2 puffs into the lungs every 4 (four) hours as needed for wheezing or shortness of breath. 10/15/21  Yes Rodriguez-Southworth, Nettie Elm, PA-C  ?amphetamine-dextroamphetamine (ADDERALL) 20 MG tablet Take 1 tablet (20 mg total) by mouth 2 (two) times daily at 10 AM and 5 PM. ?Patient taking differently: Take 20 mg by mouth 2 (two) times daily at 10 AM and 5 PM. Take 1 tablet when you wake up and one tablet after lunch. 12/20/21  Yes Clapacs, Jackquline Denmark, MD  ?escitalopram (LEXAPRO) 10 MG tablet Take 1 tablet (10 mg total) by mouth daily. 10/04/21  Yes Clapacs, Jackquline Denmark, MD  ?guaiFENesin-Codeine 100-6.33 MG/5ML SOLN Take 5 mLs by mouth at bedtime. ?Patient not taking: Reported on 01/08/2022 10/15/21   Rodriguez-Southworth, Nettie Elm, PA-C  ?predniSONE (DELTASONE) 20 MG tablet Take 1 tablet (20 mg total) by mouth daily with breakfast. ?Patient not taking: Reported on 01/08/2022 10/15/21   Rodriguez-Southworth, Nettie Elm, PA-C  ? ? ?Family History  ?Problem Relation Age of Onset  ? Diabetes Mother   ? Alzheimer's disease Mother   ? Depression Sister   ? Hypertension Father   ?  ? ?Social History  ? ?Tobacco Use  ? Smoking status: Former  ?  Packs/day: 0.50  ?  Types: Cigarettes  ? Smokeless tobacco: Never  ? Tobacco comments:  ?  "I quit months ago"  ?  Vaping Use  ? Vaping Use: Never used  ?Substance Use Topics  ? Alcohol use: Yes  ?  Comment: special - occ  ? Drug use: No  ? ? ?Allergies as of 01/08/2022  ? (No Known Allergies)  ? ? ?Review of Systems:    ?All systems reviewed and negative except where noted in HPI. ? ? Physical Exam:  ?Vital signs in last 24 hours: ?Temp:  [98.2 ?F (36.8 ?C)-98.4 ?F (36.9 ?C)] 98.4 ?F (36.9 ?C) (04/17 1100) ?Pulse Rate:  [53-81] 62 (04/17 1100) ?Resp:  [18-20] 20 (04/17 1100) ?BP: (96-138)/(64-94) 116/80 (04/17 1100) ?SpO2:  [90 %-97 %] 92 % (04/17 1100) ?Weight:  [56.7 kg] 56.7 kg (04/16 1939) ?  ?General:   Pleasant, cooperative in NAD ?Head:   Normocephalic and atraumatic. ?Eyes:   No icterus.   Conjunctiva pink. PERRLA. ?Ears:  Normal auditory acuity. ?Lungs: Respirations even and unlabored. Lungs clear to auscultation bilaterally.   No wheezes, crackles, or rhonchi.  ?Heart:  Regular rate and rhythm;  Without murmur, clicks, rubs or gallops ?Abdomen:  Soft, nondistended, nontender. Normal bowel sounds. No appreciable masses or hepatomegaly.  No rebound or guarding.  ?Neurologic:  Alert and oriented x3;  grossly normal neurologically. ?Psych:  Alert and cooperative. Normal affect. ? ?LAB RESULTS: ?Recent Labs  ?  01/08/22 ?1953 01/09/22 ?9211  ?WBC 9.3 6.1  ?HGB 14.8 13.1  ?HCT 45.0 39.4  ?PLT 323 266  ? ?BMET ?Recent Labs  ?  01/08/22 ?1953 01/09/22 ?9417  ?NA 137 139  ?K 4.4 3.8  ?CL 100 105  ?CO2 30 28  ?GLUCOSE 142* 107*  ?BUN 10 10  ?CREATININE 0.90 0.75  ?CALCIUM 9.3 8.2*  ? ?LFT ?Recent Labs  ?  01/09/22 ?4081  ?PROT 5.8*  ?ALBUMIN 3.4*  ?AST 46*  ?ALT 94*  ?ALKPHOS 42  ?BILITOT 0.8  ? ?PT/INR ?No results for input(s): LABPROT, INR in the last 72 hours. ? ?STUDIES: ?DG Abdomen 1 View ? ?Result Date: 01/09/2022 ?CLINICAL DATA:  placement of NG tube after removal and replacement EXAM: ABDOMEN - 1 VIEW COMPARISON:  X-ray abdomen 01/08/2022 FINDINGS: Interval retraction of an enteric tube courses below the hemidiaphragm with tip overlying the expected region of the gastric lumen just distal to gastroesophageal junction and side port overlying the expected region of the distal esophagus. The bowel gas pattern is normal. No radio-opaque calculi or other significant radiographic abnormality are seen. Visualized lungs are unremarkable. Excretion of previously administered intravenous contrast within bilateral collecting systems. Dextroscoliosis of the thoracolumbar spine. IMPRESSION: Interval retraction of an enteric tube courses below the hemidiaphragm with tip overlying the expected region of the gastric lumen just distal to gastroesophageal junction and  side port overlying the expected region of the distal esophagus. Recommend advancing by 8 cm. Electronically Signed   By: Tish Frederickson M.D.   On: 01/09/2022 00:22  ? ?DG Abdomen 1 View ? ?Result Date: 01/08/2022 ?CLINICAL DATA:  Nasogastric tube placement. EXAM: ABDOMEN - 1 VIEW COMPARISON:  None. FINDINGS: A nasogastric tube is seen with its distal tip overlying the expected region of the body of the stomach. The distal side hole is approximately 2.0 cm distal to the expected region of the gastroesophageal junction. The bowel gas pattern is normal. Radiopaque contrast is seen within the bilateral renal collecting systems. IMPRESSION: Nasogastric tube positioning, as described above. Electronically Signed   By: Aram Candela M.D.   On: 01/08/2022 22:15  ? ?CT ABDOMEN PELVIS W CONTRAST ? ?Result Date:  01/08/2022 ?CLINICAL DATA:  Abdominal pain since 2 p.m., vomiting EXAM: CT ABDOMEN AND PELVIS WITH CONTRAST TECHNIQUE: Multidetector CT imaging of the abdomen and pelvis was performed using the standard protocol following bolus administration of intravenous contrast. RADIATION DOSE REDUCTION: This exam was performed according to the departmental dose-optimization program which includes automated exposure control, adjustment of the mA and/or kV according to patient size and/or use of iterative reconstruction technique. CONTRAST:  100mL OMNIPAQUE IOHEXOL 300 MG/ML  SOLN COMPARISON:  05/24/2009 FINDINGS: Lower chest: No acute pleural or parenchymal lung disease. Hepatobiliary: The liver liver is unremarkable without focal abnormality. No evidence of cholelithiasis or cholecystitis. There is mild biliary duct dilation, with common bile duct measuring 7 mm and mild intrahepatic duct dilation most pronounced in the left lobe. This was present to a lesser degree on the previous exam, and is of uncertain significance. No evidence of choledocholithiasis or downstream mass. Pancreas: Chronic dilation of the pancreatic duct  measuring 5 mm. Pancreas enhances normally. No inflammatory change. Spleen: Normal in size without focal abnormality. Adrenals/Urinary Tract: Minimal scarring and cortical thinning within the lower pole the kidneys bila

## 2022-01-09 NOTE — Consult Note (Signed)
SURGICAL CONSULTATION NOTE  ? ?HISTORY OF PRESENT ILLNESS (HPI):  ?56 y.o. male presented to [facility] ED for evaluation of abdominal pain. Patient reports having abdominal pain since yesterday.  Patient endorses that pain started after eating a sandwich.  Pain is generalized.  Pain does not radiate to other part of body.  Pain aggravated by eating sandwich.  There has been no alleviating factors.  Endorses having some vomiting without improvement of the pain.  Initially he was not passing gas but he told me today that he passed gas this morning.  Denies any abdominal pain during my evaluation.  Patient endorses having previous surgery for sigmoid colectomy due to diverticulitis.  He says that he had a surgery about 20 years ago. ? ?At the ED he was found with normal white blood cell count.  Normal hemoglobin.  Physical exam was concerning for abdominal distention.  CT scan of abdomen and pelvis shows multiple segments of dilated small bowel without a specific transition point.  There was gas seen on the large intestine.  No free air or free fluid.  I personally evaluated the images. ? ?Surgery is consulted by Dr. Myriam Forehand in this context for evaluation and management of small bowel obstruction. ? ?PAST MEDICAL HISTORY (PMH):  ?Past Medical History:  ?Diagnosis Date  ? ADHD (attention deficit hyperactivity disorder)   ? Asthma   ?  ? ?PAST SURGICAL HISTORY (PSH):  ?Past Surgical History:  ?Procedure Laterality Date  ? CHOLECYSTECTOMY    ? COLON SURGERY    ?  ? ?MEDICATIONS:  ?Prior to Admission medications   ?Medication Sig Start Date End Date Taking? Authorizing Provider  ?albuterol (VENTOLIN HFA) 108 (90 Base) MCG/ACT inhaler Inhale 2 puffs into the lungs every 4 (four) hours as needed for wheezing or shortness of breath. 10/15/21  Yes Rodriguez-Southworth, Nettie Elm, PA-C  ?amphetamine-dextroamphetamine (ADDERALL) 20 MG tablet Take 1 tablet (20 mg total) by mouth 2 (two) times daily at 10 AM and 5 PM. ?Patient taking  differently: Take 20 mg by mouth 2 (two) times daily at 10 AM and 5 PM. Take 1 tablet when you wake up and one tablet after lunch. 12/20/21  Yes Clapacs, Jackquline Denmark, MD  ?escitalopram (LEXAPRO) 10 MG tablet Take 1 tablet (10 mg total) by mouth daily. 10/04/21  Yes Clapacs, Jackquline Denmark, MD  ?guaiFENesin-Codeine 100-6.33 MG/5ML SOLN Take 5 mLs by mouth at bedtime. ?Patient not taking: Reported on 01/08/2022 10/15/21   Rodriguez-Southworth, Nettie Elm, PA-C  ?predniSONE (DELTASONE) 20 MG tablet Take 1 tablet (20 mg total) by mouth daily with breakfast. ?Patient not taking: Reported on 01/08/2022 10/15/21   Rodriguez-Southworth, Nettie Elm, PA-C  ?  ? ?ALLERGIES:  ?No Known Allergies  ? ?SOCIAL HISTORY:  ?Social History  ? ?Socioeconomic History  ? Marital status: Married  ?  Spouse name: Not on file  ? Number of children: Not on file  ? Years of education: Not on file  ? Highest education level: Not on file  ?Occupational History  ? Not on file  ?Tobacco Use  ? Smoking status: Former  ?  Packs/day: 0.50  ?  Types: Cigarettes  ? Smokeless tobacco: Never  ? Tobacco comments:  ?  "I quit months ago"  ?Vaping Use  ? Vaping Use: Never used  ?Substance and Sexual Activity  ? Alcohol use: Yes  ?  Comment: special - occ  ? Drug use: No  ? Sexual activity: Never  ?Other Topics Concern  ? Not on file  ?Social History Narrative  ?  Not on file  ? ?Social Determinants of Health  ? ?Financial Resource Strain: Not on file  ?Food Insecurity: Not on file  ?Transportation Needs: Not on file  ?Physical Activity: Not on file  ?Stress: Not on file  ?Social Connections: Not on file  ?Intimate Partner Violence: Not on file  ?  ? ? ?FAMILY HISTORY:  ?Family History  ?Problem Relation Age of Onset  ? Diabetes Mother   ? Alzheimer's disease Mother   ? Depression Sister   ? Hypertension Father   ?  ? ?REVIEW OF SYSTEMS:  ?Constitutional: denies weight loss, fever, chills, or sweats  ?Eyes: denies any other vision changes, history of eye injury  ?ENT: denies sore  throat, hearing problems  ?Respiratory: denies shortness of breath, wheezing  ?Cardiovascular: denies chest pain, palpitations  ?Gastrointestinal: Positive abdominal pain, nausea and vomiting ?Genitourinary: denies burning with urination or urinary frequency ?Musculoskeletal: denies any other joint pains or cramps  ?Skin: denies any other rashes or skin discolorations  ?Neurological: denies any other headache, dizziness, weakness  ?Psychiatric: denies any other depression, anxiety  ? ?All other review of systems were negative  ? ?VITAL SIGNS:  ?Temp:  [98.2 ?F (36.8 ?C)] 98.2 ?F (36.8 ?C) (04/16 1933) ?Pulse Rate:  [53-81] 57 (04/17 0700) ?Resp:  [18-20] 18 (04/17 0700) ?BP: (96-138)/(64-94) 112/77 (04/17 0700) ?SpO2:  [90 %-97 %] 91 % (04/17 0700) ?Weight:  [56.7 kg] 56.7 kg (04/16 1939)     Height: 5\' 11"  (180.3 cm) Weight: 56.7 kg BMI (Calculated): 17.44  ? ?INTAKE/OUTPUT:  ?This shift: No intake/output data recorded.  ?Last 2 shifts: @IOLAST2SHIFTS @  ? ?PHYSICAL EXAM:  ?Constitutional:  ?-- Normal body habitus  ?-- Awake, alert, and oriented x3  ?Eyes:  ?-- Pupils equally round and reactive to light  ?-- No scleral icterus  ?Ear, nose, and throat:  ?-- No jugular venous distension  ?Pulmonary:  ?-- No crackles  ?-- Equal breath sounds bilaterally ?-- Breathing non-labored at rest ?Cardiovascular:  ?-- S1, S2 present  ?-- No pericardial rubs ?Gastrointestinal:  ?-- Abdomen soft, nontender, non-distended, no guarding or rebound tenderness ?-- No abdominal masses appreciated, pulsatile or otherwise  ?Musculoskeletal and Integumentary:  ?-- Wounds: None appreciated ?-- Extremities: B/L UE and LE FROM, hands and feet warm, no edema  ?Neurologic:  ?-- Motor function: intact and symmetric ?-- Sensation: intact and symmetric ? ? ?Labs:  ? ?  Latest Ref Rng & Units 01/09/2022  ?  5:20 AM 01/08/2022  ?  7:53 PM 05/08/2021  ? 11:11 PM  ?CBC  ?WBC 4.0 - 10.5 K/uL 6.1   9.3   6.0    ?Hemoglobin 13.0 - 17.0 g/dL 01/10/2022   05/10/2021    70.0    ?Hematocrit 39.0 - 52.0 % 39.4   45.0   44.6    ?Platelets 150 - 400 K/uL 266   323   284    ? ? ?  Latest Ref Rng & Units 01/09/2022  ?  5:20 AM 01/08/2022  ?  7:53 PM 05/08/2021  ? 11:11 PM  ?CMP  ?Glucose 70 - 99 mg/dL 01/10/2022   05/10/2021   967    ?BUN 6 - 20 mg/dL 10   10   10     ?Creatinine 0.61 - 1.24 mg/dL 591   638      ?Sodium 135 - 145 mmol/L 139   137   139    ?Potassium 3.5 - 5.1 mmol/L 3.8   4.4   4.8    ?  Chloride 98 - 111 mmol/L 105   100   104    ?CO2 22 - 32 mmol/L 28   30   30     ?Calcium 8.9 - 10.3 mg/dL 8.2   9.3   8.8    ?Total Protein 6.5 - 8.1 g/dL 5.8   7.4   6.7    ?Total Bilirubin 0.3 - 1.2 mg/dL 0.8   0.9   0.6    ?Alkaline Phos 38 - 126 U/L 42   56   47    ?AST 15 - 41 U/L 46   66   17    ?ALT 0 - 44 U/L 94   126   11    ? ? ? ?Imaging studies:  ?EXAM: ?CT ABDOMEN AND PELVIS WITH CONTRAST ?  ?TECHNIQUE: ?Multidetector CT imaging of the abdomen and pelvis was performed ?using the standard protocol following bolus administration of ?intravenous contrast. ?  ?RADIATION DOSE REDUCTION: This exam was performed according to the ?departmental dose-optimization program which includes automated ?exposure control, adjustment of the mA and/or kV according to ?patient size and/or use of iterative reconstruction technique. ?  ?CONTRAST:  100mL OMNIPAQUE IOHEXOL 300 MG/ML  SOLN ?  ?COMPARISON:  05/24/2009 ?  ?FINDINGS: ?Lower chest: No acute pleural or parenchymal lung disease. ?  ?Hepatobiliary: The liver liver is unremarkable without focal ?abnormality. No evidence of cholelithiasis or cholecystitis. There ?is mild biliary duct dilation, with common bile duct measuring 7 mm ?and mild intrahepatic duct dilation most pronounced in the left ?lobe. This was present to a lesser degree on the previous exam, and ?is of uncertain significance. No evidence of choledocholithiasis or ?downstream mass. ?  ?Pancreas: Chronic dilation of the pancreatic duct measuring 5 mm. ?Pancreas enhances normally. No  inflammatory change. ?  ?Spleen: Normal in size without focal abnormality. ?  ?Adrenals/Urinary Tract: Minimal scarring and cortical thinning ?within the lower pole the kidneys bilaterally. Otherwise the kidneys ?enhance

## 2022-01-09 NOTE — ED Notes (Signed)
After speaking with the provider, Ativan was not given prior to reinserting patient's NG tube. Patient was unhappy with this news but consented to reinsertion. Patient tolerated reinsertion of his 18Fr NG tube into his right nare very well. Patient's NG tube was secured to his nose and to his gown.  ?

## 2022-01-09 NOTE — Progress Notes (Addendum)
Progress Note    Vincent [name]  CZY:[id number] DOB: 1965-10-28  DOA: 01/08/2022 PCP: Pcp, No      Brief Narrative:    Medical records reviewed and are as summarized below:  Vincent [name] is a 56 y.o. male with medical history significant for asthma, ADHD, tobacco use disorder, history of diverticulitis s/p colectomy, who presented to the hospital with abdominal pain and vomiting.       Assessment/Plan:   Principal Problem:   SBO (small bowel obstruction) (HCC) Active Problems:   Abdominal pain   Abnormal CT of the abdomen   Elevated LFTs   ADHD (attention deficit hyperactivity disorder), combined type   Anxiety   History of tobacco abuse    Body mass index is 17.43 kg/m.   Small bowel obstruction, history of colectomy for diverticulitis: Improving.  Plan to remove NG tube today and start clear liquids.  Appreciate input from general surgeon.  Analgesics as needed for pain.  Continue IV fluids for now.  Monitor electrolytes.  Biliary ductal dilatation, elevated liver enzymes: Consulted GI to assist with management.  MRCP has been ordered for further evaluation as recommended by GI.  Other comorbidities include anxiety, ADHD, tobacco use disorder, asthma  Diet Order             Diet clear liquid Room service appropriate? Yes; Fluid consistency: Thin  Diet effective now                            Consultants: Surveyor, minerals patient  Procedures: None    Medications:    amphetamine-dextroamphetamine  20 mg Oral BID   escitalopram  10 mg Oral Daily   heparin  5,000 Units Subcutaneous Q8H   pantoprazole (PROTONIX) IV  40 mg Intravenous Q12H   Continuous Infusions:  sodium chloride 75 mL/hr at 01/08/22 2350     Anti-infectives (From admission, onward)    None              Family Communication/Anticipated D/C date and plan/Code Status   DVT prophylaxis: heparin injection 5,000 Units Start: 01/08/22  2215 SCDs Start: 01/08/22 2201     Code Status: Full Code  Family Communication: None Disposition Plan: Plan to discharge home in 1 to 2 days   Status is: Observation The patient will require care spanning > 2 midnights and should be moved to inpatient because: Small bowel obstruction       Subjective:   Interval events noted.  He was agitated earlier but calmed down after Haldol.  Abdominal pain is better.  Objective:    Vitals:   01/09/22 0500 01/09/22 0700 01/09/22 1100 01/09/22 1400  BP: 119/86 112/77 116/80 117/67  Pulse: 73 (!) 57 62 61  Resp:  18 20 18   Temp:   98.4 F (36.9 C) 98.6 F (37 C)  TempSrc:   Oral Oral  SpO2: 96% 91% 92% 92%  Weight:      Height:       No data found.   Intake/Output Summary (Last 24 hours) at 01/09/2022 1622 Last data filed at 01/08/2022 2100 Gross per 24 hour  Intake 1000 ml  Output --  Net 1000 ml   Filed Weights   01/08/22 1934 01/08/22 1939  Weight: 56.7 kg 56.7 kg    Exam:  GEN: NAD SKIN: No rash EYES: EOMI ENT: MMM, NG tube draining brownish fluid into the canister CV: RRR PULM: CTA B  ABD: soft, ND, NT, +BS CNS: AAO x 3, non focal EXT: No edema or tenderness        Data Reviewed:   I have personally reviewed following labs and imaging studies:  Labs: Labs show the following:   Basic Metabolic Panel: Recent Labs  Lab 01/08/22 1953 01/09/22 0520  NA 137 139  K 4.4 3.8  CL 100 105  CO2 30 28  GLUCOSE 142* 107*  BUN 10 10  CREATININE 0.90 0.75  CALCIUM 9.3 8.2*   GFR Estimated Creatinine Clearance: 83.7 mL/min (by C-G formula based on SCr of 0.75 mg/dL). Liver Function Tests: Recent Labs  Lab 01/08/22 1953 01/09/22 0520  AST 66* 46*  ALT 126* 94*  ALKPHOS 56 42  BILITOT 0.9 0.8  PROT 7.4 5.8*  ALBUMIN 4.4 3.4*   Recent Labs  Lab 01/08/22 1953  LIPASE 27   No results for input(s): AMMONIA in the last 168 hours. Coagulation profile No results for input(s): INR, PROTIME in  the last 168 hours.  CBC: Recent Labs  Lab 01/08/22 1953 01/09/22 0520  WBC 9.3 6.1  NEUTROABS 7.8*  --   HGB 14.8 13.1  HCT 45.0 39.4  MCV 91.6 91.4  PLT 323 266   Cardiac Enzymes: No results for input(s): CKTOTAL, CKMB, CKMBINDEX, TROPONINI in the last 168 hours. BNP (last 3 results) No results for input(s): PROBNP in the last 8760 hours. CBG: No results for input(s): GLUCAP in the last 168 hours. D-Dimer: No results for input(s): DDIMER in the last 72 hours. Hgb A1c: No results for input(s): HGBA1C in the last 72 hours. Lipid Profile: No results for input(s): CHOL, HDL, LDLCALC, TRIG, CHOLHDL, LDLDIRECT in the last 72 hours. Thyroid function studies: No results for input(s): TSH, T4TOTAL, T3FREE, THYROIDAB in the last 72 hours.  Invalid input(s): FREET3 Anemia work up: No results for input(s): VITAMINB12, FOLATE, FERRITIN, TIBC, IRON, RETICCTPCT in the last 72 hours. Sepsis Labs: Recent Labs  Lab 01/08/22 1953 01/09/22 0520  WBC 9.3 6.1    Microbiology No results found for this or any previous visit (from the past 240 hour(s)).  Procedures and diagnostic studies:  DG Abdomen 1 View  Result Date: 01/09/2022 CLINICAL DATA:  placement of NG tube after removal and replacement EXAM: ABDOMEN - 1 VIEW COMPARISON:  X-ray abdomen 01/08/2022 FINDINGS: Interval retraction of an enteric tube courses below the hemidiaphragm with tip overlying the expected region of the gastric lumen just distal to gastroesophageal junction and side port overlying the expected region of the distal esophagus. The bowel gas pattern is normal. No radio-opaque calculi or other significant radiographic abnormality are seen. Visualized lungs are unremarkable. Excretion of previously administered intravenous contrast within bilateral collecting systems. Dextroscoliosis of the thoracolumbar spine. IMPRESSION: Interval retraction of an enteric tube courses below the hemidiaphragm with tip overlying the  expected region of the gastric lumen just distal to gastroesophageal junction and side port overlying the expected region of the distal esophagus. Recommend advancing by 8 cm. Electronically Signed   By: Tish Frederickson M.D.   On: 01/09/2022 00:22   DG Abdomen 1 View  Result Date: 01/08/2022 CLINICAL DATA:  Nasogastric tube placement. EXAM: ABDOMEN - 1 VIEW COMPARISON:  None. FINDINGS: A nasogastric tube is seen with its distal tip overlying the expected region of the body of the stomach. The distal side hole is approximately 2.0 cm distal to the expected region of the gastroesophageal junction. The bowel gas pattern is normal. Radiopaque contrast is seen within  the bilateral renal collecting systems. IMPRESSION: Nasogastric tube positioning, as described above. Electronically Signed   By: Aram Candela M.D.   On: 01/08/2022 22:15   CT ABDOMEN PELVIS W CONTRAST  Result Date: 01/08/2022 CLINICAL DATA:  Abdominal pain since 2 p.m., vomiting EXAM: CT ABDOMEN AND PELVIS WITH CONTRAST TECHNIQUE: Multidetector CT imaging of the abdomen and pelvis was performed using the standard protocol following bolus administration of intravenous contrast. RADIATION DOSE REDUCTION: This exam was performed according to the departmental dose-optimization program which includes automated exposure control, adjustment of the mA and/or kV according to patient size and/or use of iterative reconstruction technique. CONTRAST:  OMNIPAQUE IOHEXOL 300 MG/ML  SOLN COMPARISON:  05/24/2009 FINDINGS: Lower chest: No acute pleural or parenchymal lung disease. Hepatobiliary: The liver liver is unremarkable without focal abnormality. No evidence of cholelithiasis or cholecystitis. There is mild biliary duct dilation, with common bile duct measuring 7 mm and mild intrahepatic duct dilation most pronounced in the left lobe. This was present to a lesser degree on the previous exam, and is of uncertain significance. No evidence of  choledocholithiasis or downstream mass. Pancreas: Chronic dilation of the pancreatic duct measuring 5 mm. Pancreas enhances normally. No inflammatory change. Spleen: Normal in size without focal abnormality. Adrenals/Urinary Tract: Minimal scarring and cortical thinning within the lower pole the kidneys bilaterally. Otherwise the kidneys enhance normally. No urinary tract calculi or obstructive uropathy. Bladder is decompressed, limiting its evaluation. Adrenals are grossly normal. Stomach/Bowel: There is segmental dilation of the distal jejunum and ileum, measuring up to 2.9 cm in diameter and containing multiple gas fluid levels. Findings are consistent with regional ileus or developing obstruction. Gas and stool are seen throughout the colon to the level of the rectum. No bowel wall thickening or inflammatory change. Vascular/Lymphatic: No significant vascular findings are present. No enlarged abdominal or pelvic lymph nodes. Reproductive: Prostate is unremarkable. Other: No free fluid or free gas. There is a small fat containing left supraumbilical ventral hernia, slightly increased in size since prior study. No bowel herniation. Musculoskeletal: No acute or destructive bony lesions. Reconstructed images demonstrate no additional findings. IMPRESSION: 1. Segmental dilation of the distal jejunum and ileum, which may reflect early or intermittent obstruction. Serial radiographic follow-up is recommended. 2. Small fat containing left supraumbilical ventral hernia. No bowel herniation. 3. Nonspecific biliary and pancreatic duct dilation, with no evidence of obstructing mass or choledocholithiasis. This was present to a lesser degree on the prior exam and is nonspecific. Electronically Signed   By: Sharlet Salina M.D.   On: 01/08/2022 20:56   DG Abd 2 Views  Result Date: 01/09/2022 CLINICAL DATA:  Follow-up small bowel obstruction. EXAM: ABDOMEN - 2 VIEW COMPARISON:  Radiographs January 09, 2022 and CT January 08, 2022 FINDINGS: Nasogastric tube with tip and side port overlying the stomach. No gaseous lead dilated loops of large or small bowel in the abdomen. Radiopaque contrast in the urinary bladder. S-shaped scoliotic curvature of the thoracolumbar spine. IMPRESSION: Nonobstructive bowel gas pattern. Electronically Signed   By: Maudry Mayhew M.D.   On: 01/09/2022 11:02   US Abdomen Limited RUQ (LIVER/GB)  Result Date: 01/09/2022 CLINICAL DATA:  56 year old male with abdominal pain. EXAM: ULTRASOUND ABDOMEN LIMITED RIGHT UPPER QUADRANT COMPARISON:  CT Abdomen and Pelvis 01/08/2022. FINDINGS: Gallbladder: No gallstones or wall thickening visualized. No sonographic Murphy sign noted by sonographer. Common bile duct: Diameter: Up to 12 mm, dilated (image 18) and larger that on the CT yesterday (at most 9 mm  there). No filling defect identified within the duct. Liver: Evidence now of intrahepatic ductal dilatation (image 37) but background normal liver echogenicity. No discrete liver lesion. Portal vein is patent on color Doppler imaging with normal direction of blood flow towards the liver. Other: Negative visible right kidney.  No free fluid. IMPRESSION: Biliary ductal dilatation, which seems increased from the CT Abdomen and Pelvis yesterday. But no gallstones or ductal filling defect identified. ERCP might be the most valuable next step. Electronically Signed   By: Odessa Fleming M.D.   On: 01/09/2022 05:39               LOS: 0 days   Vincent [name]  [facility]   Pager on [email or web address]. If 7PM-7AM, please contact night-coverage at [email or web address]     01/09/2022, 4:22 PM

## 2022-01-09 NOTE — ED Notes (Signed)
Patient is now curled up in the fetal position in the bed. He refuses to sit up and lay in the proper position for NG tube placement. His tube also had become untaped again and was pulling behind him. Unsure if his tube has come dislodged again since this NG tube does not have number measurements. There still appears to be gastric contents being suctioned. ?

## 2022-01-09 NOTE — ED Notes (Signed)
Patient remains curled up in the fetal position on his right side. NG tube remains in place and taped to his nose and gown. Patient resting comfortably. Patient verbalizes no needs or complaints at this time. ?

## 2022-01-09 NOTE — ED Notes (Signed)
Patient now resting sitting upright but slid down in the bed. RR even and unlabored. Upon closer inspection, I noted that the NG tube had come out but not completely. It was no longer taped to his nose but it was still taped to his gown. I woke the patient and told him that I was just going to push the NG tube back into place again. He just nodded in response. I pushed the tube back into place and then retaped it. Placement was checked by auscultation and by hooking up to suction with production of bile. Patient went back to sleep. ?

## 2022-01-09 NOTE — ED Notes (Signed)
After patient was instructed to remain in the bed, unzip his pants, and use his urinal, he accidentally pulled out his NG tube again. Patient will not allow me to replace it unless he has more Ativan. I told him that I would contact the provider.  ?

## 2022-01-09 NOTE — ED Notes (Signed)
Patient resting comfortably on stretcher with eyes closed. RR even and unlabored. Patient's NG tube remain in place at this time. Patient verbalizes no needs or complaints at this time.  ?

## 2022-01-09 NOTE — ED Notes (Signed)
Patient resting comfortably in stretcher at this time. Respirations even and unlabored. NAD noted. No needs expressed to RN at this time. ?

## 2022-01-09 NOTE — ED Notes (Signed)
Patient is resting comfortably on stretcher in room. RR even and unlabored. Patient's NG tube remains in place and taped to gown. Patient is more alert now and is now asking for coffee. Patient has been reminded that he is NPO while the NG tube is in place. Patient verbalized understanding. Patient now using his urinal. ?

## 2022-01-10 LAB — COMPREHENSIVE METABOLIC PANEL
ALT: 73 U/L — ABNORMAL HIGH (ref 0–44)
AST: 43 U/L — ABNORMAL HIGH (ref 15–41)
Albumin: 3.4 g/dL — ABNORMAL LOW (ref 3.5–5.0)
Alkaline Phosphatase: 48 U/L (ref 38–126)
Anion gap: 6 (ref 5–15)
BUN: 10 mg/dL (ref 6–20)
CO2: 26 mmol/L (ref 22–32)
Calcium: 8.6 mg/dL — ABNORMAL LOW (ref 8.9–10.3)
Chloride: 107 mmol/L (ref 98–111)
Creatinine, Ser: 0.83 mg/dL (ref 0.61–1.24)
GFR, Estimated: >60 mL/min (ref >60)
Glucose, Bld: 81 mg/dL (ref 70–99)
Potassium: 4.1 mmol/L (ref 3.5–5.1)
Sodium: 139 mmol/L (ref 135–145)
Total Bilirubin: 1 mg/dL (ref 0.3–1.2)
Total Protein: 5.9 g/dL — ABNORMAL LOW (ref 6.5–8.1)

## 2022-01-10 LAB — MAGNESIUM: Magnesium: 2.2 mg/dL (ref 1.7–2.4)

## 2022-01-10 LAB — HIV ANTIBODY (ROUTINE TESTING W REFLEX): HIV Screen 4th Generation wRfx: NONREACTIVE

## 2022-01-10 MED ORDER — PANTOPRAZOLE SODIUM 40 MG PO TBEC
40.0000 mg | DELAYED_RELEASE_TABLET | Freq: Two times a day (BID) | ORAL | Status: DC
Start: 2022-01-10 — End: 2022-01-10
  Administered 2022-01-10: 40 mg via ORAL
  Filled 2022-01-10: qty 1

## 2022-01-10 NOTE — Discharge Summary (Signed)
?Physician Discharge Summary ?  ?Patient: Vincent [name] MRN: [id number] DOB: 06/09/1966  ?Admit date:     01/08/2022  ?Discharge date: 01/10/2022  ?Discharge Physician: Lurene Shadow  ? ?PCP: Pcp, No  ? ?Recommendations at discharge:  ? ? ?Follow up with PCP in 1 week ?Follow-up with gastroenterologist, Dr. Tobi Bastos, in 2 weeks ? ?Discharge Diagnoses: ?Principal Problem: ?  SBO (small bowel obstruction) (HCC) ?Active Problems: ?  Abdominal pain ?  Abnormal CT of the abdomen ?  Elevated LFTs ?  ADHD (attention deficit hyperactivity disorder), combined type ?  Anxiety ?  History of tobacco abuse ?  Small bowel obstruction (HCC) ? ?Resolved Problems: ?  * No resolved hospital problems. * ? ?Hospital Course: ? ?Vincent [name] is a 56 y.o. male with medical history significant for asthma, ADHD, tobacco use disorder, history of diverticulitis s/p colectomy, who presented to the hospital with abdominal pain and vomiting. ?  ?He was admitted to the hospital with small bowel obstruction.  He was managed conservatively with analgesics, IV fluids and gastric decompression with NG tube.  He also has mild biliary ductal dilation and liver enzymes were mildly elevated.  MRCP did not show any biliary obstruction.  Outpatient follow-up with gastroenterologist was recommended.  His condition has improved and he is deemed stable for discharge to home today. ? ? ? ?  ? ? ?Consultants: General surgeon, gastroenterologist ?Procedures performed: None ?Disposition: Home ?Diet recommendation:  ?Discharge Diet Orders (From admission, onward)  ? ?  Start     Ordered  ? 01/10/22 0000  Diet - low sodium heart healthy       ? 01/10/22 1052  ? ?  ?  ? ?  ? ?Cardiac diet ?DISCHARGE MEDICATION: ?Allergies as of 01/10/2022   ?No Known Allergies ?  ? ?  ?Medication List  ?  ? ?STOP taking these medications   ? ?guaiFENesin-Codeine 100-6.33 MG/5ML Soln ?  ?predniSONE 20 MG tablet ?Commonly known as: DELTASONE ?  ? ?  ? ?TAKE these medications    ? ?albuterol 108 (90 Base) MCG/ACT inhaler ?Commonly known as: VENTOLIN HFA ?Inhale 2 puffs into the lungs every 4 (four) hours as needed for wheezing or shortness of breath. ?  ?amphetamine-dextroamphetamine 20 MG tablet ?Commonly known as: ADDERALL ?Take 1 tablet (20 mg total) by mouth 2 (two) times daily at 10 AM and 5 PM. ?What changed: additional instructions ?  ?escitalopram 10 MG tablet ?Commonly known as: LEXAPRO ?Take 1 tablet (10 mg total) by mouth daily. ?  ? ?  ? ? Follow-up Information   ? ? Wyline Mood, MD. Schedule an appointment as soon as possible for a visit in 2 week(s).   ?Specialty: Gastroenterology ?Why: Mild biliary duct dilation ?Contact information: ?[address] ?[address] ?[place] [address] ?[phone] ? ? ?  ?  ? ?  ?  ? ?  ? ?Discharge Exam: ?Filed Weights  ? 01/08/22 1934 01/08/22 1939  ?Weight: 56.7 kg 56.7 kg  ? ?GEN: NAD ?SKIN: No rash ?EYES: EOMI ?ENT: MMM ?CV: RRR ?PULM: CTA B ?ABD: soft, ND, NT, +BS ?CNS: AAO x 3, non focal ?EXT: No edema or tenderness ? ? ?Condition at discharge: good ? ?The results of significant diagnostics from this hospitalization (including imaging, microbiology, ancillary and laboratory) are listed below for reference.  ? ?Imaging Studies: ?DG Abdomen 1 View ? ?Result Date: 01/09/2022 ?CLINICAL DATA:  placement of NG tube after removal and replacement EXAM: ABDOMEN - 1  VIEW COMPARISON:  X-ray abdomen 01/08/2022 FINDINGS: Interval retraction of an enteric tube courses below the hemidiaphragm with tip overlying the expected region of the gastric lumen just distal to gastroesophageal junction and side port overlying the expected region of the distal esophagus. The bowel gas pattern is normal. No radio-opaque calculi or other significant radiographic abnormality are seen. Visualized lungs are unremarkable. Excretion of previously administered intravenous contrast within bilateral collecting systems. Dextroscoliosis of the thoracolumbar spine.  IMPRESSION: Interval retraction of an enteric tube courses below the hemidiaphragm with tip overlying the expected region of the gastric lumen just distal to gastroesophageal junction and side port overlying the expected region of the distal esophagus. Recommend advancing by 8 cm. Electronically Signed   By: Tish FredericksonMorgane  Naveau M.D.   On: 01/09/2022 00:22  ? ?DG Abdomen 1 View ? ?Result Date: 01/08/2022 ?CLINICAL DATA:  Nasogastric tube placement. EXAM: ABDOMEN - 1 VIEW COMPARISON:  None. FINDINGS: A nasogastric tube is seen with its distal tip overlying the expected region of the body of the stomach. The distal side hole is approximately 2.0 cm distal to the expected region of the gastroesophageal junction. The bowel gas pattern is normal. Radiopaque contrast is seen within the bilateral renal collecting systems. IMPRESSION: Nasogastric tube positioning, as described above. Electronically Signed   By: Aram Candelahaddeus  Houston M.D.   On: 01/08/2022 22:15  ? ?CT ABDOMEN PELVIS W CONTRAST ? ?Result Date: 01/08/2022 ?CLINICAL DATA:  Abdominal pain since 2 p.m., vomiting EXAM: CT ABDOMEN AND PELVIS WITH CONTRAST TECHNIQUE: Multidetector CT imaging of the abdomen and pelvis was performed using the standard protocol following bolus administration of intravenous contrast. RADIATION DOSE REDUCTION: This exam was performed according to the departmental dose-optimization program which includes automated exposure control, adjustment of the mA and/or kV according to patient size and/or use of iterative reconstruction technique. CONTRAST:  100mL OMNIPAQUE IOHEXOL 300 MG/ML  SOLN COMPARISON:  05/24/2009 FINDINGS: Lower chest: No acute pleural or parenchymal lung disease. Hepatobiliary: The liver liver is unremarkable without focal abnormality. No evidence of cholelithiasis or cholecystitis. There is mild biliary duct dilation, with common bile duct measuring 7 mm and mild intrahepatic duct dilation most pronounced in the left lobe. This was  present to a lesser degree on the previous exam, and is of uncertain significance. No evidence of choledocholithiasis or downstream mass. Pancreas: Chronic dilation of the pancreatic duct measuring 5 mm. Pancreas enhances normally. No inflammatory change. Spleen: Normal in size without focal abnormality. Adrenals/Urinary Tract: Minimal scarring and cortical thinning within the lower pole the kidneys bilaterally. Otherwise the kidneys enhance normally. No urinary tract calculi or obstructive uropathy. Bladder is decompressed, limiting its evaluation. Adrenals are grossly normal. Stomach/Bowel: There is segmental dilation of the distal jejunum and ileum, measuring up to 2.9 cm in diameter and containing multiple gas fluid levels. Findings are consistent with regional ileus or developing obstruction. Gas and stool are seen throughout the colon to the level of the rectum. No bowel wall thickening or inflammatory change. Vascular/Lymphatic: No significant vascular findings are present. No enlarged abdominal or pelvic lymph nodes. Reproductive: Prostate is unremarkable. Other: No free fluid or free gas. There is a small fat containing left supraumbilical ventral hernia, slightly increased in size since prior study. No bowel herniation. Musculoskeletal: No acute or destructive bony lesions. Reconstructed images demonstrate no additional findings. IMPRESSION: 1. Segmental dilation of the distal jejunum and ileum, which may reflect early or intermittent obstruction. Serial radiographic follow-up is recommended. 2. Small fat containing left supraumbilical ventral  hernia. No bowel herniation. 3. Nonspecific biliary and pancreatic duct dilation, with no evidence of obstructing mass or choledocholithiasis. This was present to a lesser degree on the prior exam and is nonspecific. Electronically Signed   By: Sharlet Salina M.D.   On: 01/08/2022 20:56  ? ?MR 3D Recon At Scanner ? ?Result Date: 01/10/2022 ?CLINICAL DATA:  Biliary  ductal dilatation. EXAM: MRI ABDOMEN WITHOUT AND WITH CONTRAST (INCLUDING MRCP) TECHNIQUE: Multiplanar multisequence MR imaging of the abdomen was performed both before and after the administration of intravenous contrast.

## 2022-01-10 NOTE — Progress Notes (Signed)
Patient ID: Vincent [name], male   DOB: 1966-05-22, 56 y.o.   MRN: [id number] ?    SURGICAL PROGRESS NOTE  ? ?Hospital Day(s): 1.  ? ?Interval History: Patient seen and examined, no acute events or new complaints overnight.  Patient was found sleeping again.  Every time that I walk in the room he is sleeping.  Patient reports no abdominal pain.  He had a bowel movement last night.  He denies any nausea or vomiting.  He is tolerating diet. ? ?Vital signs in last 24 hours: [min-max] current  ?Temp:  [98.1 ?F (36.7 ?C)-98.6 ?F (37 ?C)] 98.1 ?F (36.7 ?C) (04/18 0329) ?Pulse Rate:  [61-95] 79 (04/18 0329) ?Resp:  [16-20] 18 (04/18 0329) ?BP: (101-124)/(67-98) 124/86 (04/18 0329) ?SpO2:  [92 %-97 %] 97 % (04/18 0329)     Height: 5\' 11"  (180.3 cm) Weight: 56.7 kg BMI (Calculated): 17.44  ? ?Physical Exam:  ?Constitutional: alert, cooperative and no distress  ?Respiratory: breathing non-labored at rest  ?Cardiovascular: regular rate and sinus rhythm  ?Gastrointestinal: soft, non-tender, and non-distended ? ?Labs:  ? ?  Latest Ref Rng & Units 01/09/2022  ?  5:20 AM 01/08/2022  ?  7:53 PM 05/08/2021  ? 11:11 PM  ?CBC  ?WBC 4.0 - 10.5 K/uL 6.1   9.3   6.0    ?Hemoglobin 13.0 - 17.0 g/dL 05/10/2021   20.2   54.2    ?Hematocrit 39.0 - 52.0 % 39.4   45.0   44.6    ?Platelets 150 - 400 K/uL 266   323   284    ? ? ?  Latest Ref Rng & Units 01/10/2022  ?  3:26 AM 01/09/2022  ?  5:20 AM 01/08/2022  ?  7:53 PM  ?CMP  ?Glucose 70 - 99 mg/dL 81   01/10/2022   237    ?BUN 6 - 20 mg/dL 10   10   10     ?Creatinine 0.61 - 1.24 mg/dL 628     3.15    ?Sodium 135 - 145 mmol/L 139   139   137    ?Potassium 3.5 - 5.1 mmol/L 4.1   3.8   4.4    ?Chloride 98 - 111 mmol/L 107   105   100    ?CO2 22 - 32 mmol/L 26   28   30     ?Calcium 8.9 - 10.3 mg/dL 8.6   8.2   9.3    ?Total Protein 6.5 - 8.1 g/dL 5.9   5.8   7.4    ?Total Bilirubin 0.3 - 1.2 mg/dL 1.0   0.8   0.9    ?Alkaline Phos 38 - 126 U/L 48   42   56    ?AST 15 - 41 U/L 43   46   66    ?ALT 0 - 44 U/L 73    94   126    ? ? ?Imaging studies: Abdominal x-ray yesterday shows normal gas pattern.  No sign of obstruction.  I personally evaluated the images. ? ? ?Assessment/Plan:  ?56 y.o. male with small bowel obstruction, complicated by pertinent comorbidities including ADHD, bronchial asthma. ? ?-Diet advance to soft diet ?-If he tolerates some diet he can be discharged from surgical standpoint, if no further work-up from GI standpoint. ?-I encouraged the patient to ambulate ?-No need for surgical follow-up as outpatient ? ?1.60, MD ? ? ? ?

## 2022-01-10 NOTE — TOC Initial Note (Signed)
Transition of Care (TOC) - Initial/Assessment Note  ? ? ?Patient Details  ?Name: Vincent [name] ?MRN: [id number] ?Date of Birth: 1966-07-09 ? ?Transition of Care (TOC) CM/SW Contact:    ?Chapman Fitch, RN ?Phone Number: ?01/10/2022, 11:28 AM ? ?Clinical Narrative:                 ? ? ?Transition of Care (TOC) Screening Note ? ? ?Patient Details  ?Name: Vincent [name] ?Date of Birth: [birth year] ? ? ?Transition of Care (TOC) CM/SW Contact:    ?Chapman Fitch, RN ?Phone Number: ?01/10/2022, 11:29 AM ? ? ? ?Transition of Care Department [facility]) has reviewed patient and no TOC needs have been identified at this time. We will continue to monitor patient advancement through interdisciplinary progression rounds. If new patient transition needs arise, please place a TOC consult. ?  ?  ?  ? ? ?Patient Goals and CMS Choice ?  ?  ?  ? ?Expected Discharge Plan and Services ?  ?  ?  ?  ?  ?Expected Discharge Date: 01/10/22               ?  ?  ?  ?  ?  ?  ?  ?  ?  ?  ? ?Prior Living Arrangements/Services ?  ?  ?  ?       ?  ?  ?  ?  ? ?Activities of Daily Living ?Home Assistive Devices/Equipment: None ?ADL Screening (condition at time of admission) ?Patient's cognitive ability adequate to safely complete daily activities?: Yes ?Is the patient deaf or have difficulty hearing?: No ?Does the patient have difficulty seeing, even when wearing glasses/contacts?: No ?Does the patient have difficulty concentrating, remembering, or making decisions?: No ?Patient able to express need for assistance with ADLs?: Yes ?Does the patient have difficulty dressing or bathing?: No ?Independently performs ADLs?: Yes (appropriate for developmental age) ?Does the patient have difficulty walking or climbing stairs?: No ?Weakness of Legs: None ?Weakness of Arms/Hands: None ? ?Permission Sought/Granted ?  ?  ?   ?   ?   ?   ? ?Emotional Assessment ?  ?  ?  ?  ?  ?  ? ?Admission diagnosis:  Small bowel obstruction (HCC) [K56.609] ?Abdominal pain  [R10.9] ?Patient Active Problem List  ? Diagnosis Date Noted  ? Small bowel obstruction (HCC) 01/09/2022  ? Abdominal pain 01/08/2022  ? Abnormal CT of the abdomen 01/08/2022  ? SBO (small bowel obstruction) (HCC) 01/08/2022  ? Elevated LFTs 01/08/2022  ? History of tobacco abuse 01/08/2022  ? Dysthymia 04/22/2015  ? Anxiety 03/25/2015  ? ADHD (attention deficit hyperactivity disorder), combined type 02/25/2015  ? ?PCP:  Pcp, No ?Pharmacy:   ?[id number] [place] [place] - [address] ?[address] ?[place] [address] ?Phone: [phone] Fax: [phone] ? ?[id number] [place], [place] - [address] ?[address] ?[place] [address] ?Phone: [phone] Fax: [phone] ? ?[id number] - [place], [place] - [address] ?[address] ?[place] [address] ?Phone: [phone] Fax: [phone] ? ? ? ? ?Social Determinants of Health (SDOH) Interventions ?  ? ?Readmission Risk Interventions ?   ? View : No data to display.  ?  ?  ?  ? ? ? ?

## 2022-01-10 NOTE — Progress Notes (Signed)
Pt discharged per MD order. IV removed. Discharge instructions reviewed with pt. Pt verbalized understanding. All questions answered to pt satisfaction. Pt declined wheelchair and walked to lobby with staff to await his ride.  ?

## 2022-01-10 NOTE — Progress Notes (Signed)
PHARMACIST - PHYSICIAN COMMUNICATION ? ?CONCERNING: IV to Oral Route Change Policy ? ?RECOMMENDATION: ?This patient is receiving pantoprazole by the intravenous route.  Based on criteria approved by the Pharmacy and Therapeutics Committee, the intravenous medication(s) is/are being converted to the equivalent oral dose form(s). ? ? ?DESCRIPTION: ?These criteria include: ?The patient is eating (either orally or via tube) and/or has been taking other orally administered medications for a least 24 hours ?The patient has no evidence of active gastrointestinal bleeding or impaired GI absorption (gastrectomy, short bowel, patient on TNA or NPO). ? ?If you have questions about this conversion, please contact the Pharmacy Department  ? ?Tressie Ellis, RPH ?01/10/2022 7:33 AM  ?

## 2022-01-10 NOTE — Progress Notes (Signed)
? ?Wyline Mood , MD ?[address], [address], [place], [place], [address] ?[address], [place], [place], [address] ?Phone: [phone]  ?Fax: [phone] ? ? ?Vincent [name] is being followed for SBO  Day 1 of follow up  ? ?Subjective: ?Feeling well no nausea no vomiting had 3 bowel movements. ? ? ?Objective: ?Vital signs in last 24 hours: ?Vitals:  ? 01/09/22 1644 01/09/22 1925 01/10/22 0329 01/10/22 0759  ?BP: (!) 119/98 101/73 124/86 (!) 137/93  ?Pulse: 95 70 79 77  ?Resp: 16 20 18 18   ?Temp: 98.2 ?F (36.8 ?C) 98.6 ?F (37 ?C) 98.1 ?F (36.7 ?C) 98.2 ?F (36.8 ?C)  ?TempSrc:  Oral Oral Oral  ?SpO2: 95% 95% 97% 96%  ?Weight:      ?Height:      ? ?Weight change:  ? ?Intake/Output Summary (Last 24 hours) at 01/10/2022 1011 ?Last data filed at 01/10/2022 0300 ?Gross per 24 hour  ?Intake 240 ml  ?Output --  ?Net 240 ml  ? ? ? ?Exam: ?Heart:: Regular rate and rhythm, S1S2 present, or without murmur or extra heart sounds ?Lungs: normal and clear to auscultation ?Abdomen: soft, nontender, normal bowel sounds ? ? ?Lab Results: ?@LABTEST2 @ ?Micro Results: ?No results found for this or any previous visit (from the past 240 hour(s)). ?Studies/Results: ?DG Abdomen 1 View ? ?Result Date: 01/09/2022 ?CLINICAL DATA:  placement of NG tube after removal and replacement EXAM: ABDOMEN - 1 VIEW COMPARISON:  X-ray abdomen 01/08/2022 FINDINGS: Interval retraction of an enteric tube courses below the hemidiaphragm with tip overlying the expected region of the gastric lumen just distal to gastroesophageal junction and side port overlying the expected region of the distal esophagus. The bowel gas pattern is normal. No radio-opaque calculi or other significant radiographic abnormality are seen. Visualized lungs are unremarkable. Excretion of previously administered intravenous contrast within bilateral collecting systems. Dextroscoliosis of the thoracolumbar spine. IMPRESSION: Interval retraction of an enteric tube courses below  the hemidiaphragm with tip overlying the expected region of the gastric lumen just distal to gastroesophageal junction and side port overlying the expected region of the distal esophagus. Recommend advancing by 8 cm. Electronically Signed   By: 01/11/2022 M.D.   On: 01/09/2022 00:22  ? ?DG Abdomen 1 View ? ?Result Date: 01/08/2022 ?CLINICAL DATA:  Nasogastric tube placement. EXAM: ABDOMEN - 1 VIEW COMPARISON:  None. FINDINGS: A nasogastric tube is seen with its distal tip overlying the expected region of the body of the stomach. The distal side hole is approximately 2.0 cm distal to the expected region of the gastroesophageal junction. The bowel gas pattern is normal. Radiopaque contrast is seen within the bilateral renal collecting systems. IMPRESSION: Nasogastric tube positioning, as described above. Electronically Signed   By: 01/11/2022 M.D.   On: 01/08/2022 22:15  ? ?CT ABDOMEN PELVIS W CONTRAST ? ?Result Date: 01/08/2022 ?CLINICAL DATA:  Abdominal pain since 2 p.m., vomiting EXAM: CT ABDOMEN AND PELVIS WITH CONTRAST TECHNIQUE: Multidetector CT imaging of the abdomen and pelvis was performed using the standard protocol following bolus administration of intravenous contrast. RADIATION DOSE REDUCTION: This exam was performed according to the departmental dose-optimization program which includes automated exposure control, adjustment of the mA and/or kV according to patient size and/or use of iterative reconstruction technique. CONTRAST:  01/10/2022 OMNIPAQUE IOHEXOL 300 MG/ML  SOLN COMPARISON:  05/24/2009 FINDINGS: Lower chest: No acute pleural or parenchymal lung disease. Hepatobiliary: The liver liver is unremarkable without focal abnormality. No evidence of cholelithiasis or  cholecystitis. There is mild biliary duct dilation, with common bile duct measuring 7 mm and mild intrahepatic duct dilation most pronounced in the left lobe. This was present to a lesser degree on the previous exam, and is of  uncertain significance. No evidence of choledocholithiasis or downstream mass. Pancreas: Chronic dilation of the pancreatic duct measuring 5 mm. Pancreas enhances normally. No inflammatory change. Spleen: Normal in size without focal abnormality. Adrenals/Urinary Tract: Minimal scarring and cortical thinning within the lower pole the kidneys bilaterally. Otherwise the kidneys enhance normally. No urinary tract calculi or obstructive uropathy. Bladder is decompressed, limiting its evaluation. Adrenals are grossly normal. Stomach/Bowel: There is segmental dilation of the distal jejunum and ileum, measuring up to 2.9 cm in diameter and containing multiple gas fluid levels. Findings are consistent with regional ileus or developing obstruction. Gas and stool are seen throughout the colon to the level of the rectum. No bowel wall thickening or inflammatory change. Vascular/Lymphatic: No significant vascular findings are present. No enlarged abdominal or pelvic lymph nodes. Reproductive: Prostate is unremarkable. Other: No free fluid or free gas. There is a small fat containing left supraumbilical ventral hernia, slightly increased in size since prior study. No bowel herniation. Musculoskeletal: No acute or destructive bony lesions. Reconstructed images demonstrate no additional findings. IMPRESSION: 1. Segmental dilation of the distal jejunum and ileum, which may reflect early or intermittent obstruction. Serial radiographic follow-up is recommended. 2. Small fat containing left supraumbilical ventral hernia. No bowel herniation. 3. Nonspecific biliary and pancreatic duct dilation, with no evidence of obstructing mass or choledocholithiasis. This was present to a lesser degree on the prior exam and is nonspecific. Electronically Signed   By: Sharlet Salina M.D.   On: 01/08/2022 20:56  ? ?MR 3D Recon At Scanner ? ?Result Date: 01/10/2022 ?CLINICAL DATA:  Biliary ductal dilatation. EXAM: MRI ABDOMEN WITHOUT AND WITH CONTRAST  (INCLUDING MRCP) TECHNIQUE: Multiplanar multisequence MR imaging of the abdomen was performed both before and after the administration of intravenous contrast. Heavily T2-weighted images of the biliary and pancreatic ducts were obtained, and three-dimensional MRCP images were rendered by post processing. CONTRAST:  57mL GADAVIST GADOBUTROL 1 MMOL/ML IV SOLN COMPARISON:  Ultrasound exam 01/09/2022.  CT scan 01/08/2022. FINDINGS: Lower chest: Unremarkable. Hepatobiliary: Postcontrast imaging of the liver is substantially motion degraded which could obscure small or subtle lesions. Within this limitation, no gross mass lesion is identified within the liver. Mild intrahepatic biliary duct prominence evident. Common duct measures 8-9 mm diameter in the hepatoduodenal ligament with common bile duct in the head of the pancreas measuring 7 mm diameter. No evidence for gallstones or gallbladder wall thickening. No pericholecystic fluid. Pancreas: Prominence of the main pancreatic duct measuring 3-4 mm diameter. Otherwise unremarkable on precontrast imaging. Motion degraded assessment on postcontrast imaging. Spleen:  No splenomegaly. No focal mass lesion. Adrenals/Urinary Tract: No adrenal nodule or mass. No suspicious enhancing mass lesion in either kidney. Stomach/Bowel: Stomach is unremarkable. No gastric wall thickening. No evidence of outlet obstruction. Duodenum is normally positioned as is the ligament of Treitz. No small bowel or colonic dilatation within the visualized abdomen. Vascular/Lymphatic: No abdominal aortic aneurysm. No abdominal lymphadenopathy. Other:  No intraperitoneal free fluid. Musculoskeletal: No focal suspicious marrow enhancement within the visualized bony anatomy. IMPRESSION: Mild intrahepatic and extrahepatic biliary duct prominence with mild prominence of the main pancreatic duct. No choledocholithiasis. No mass lesion identified within the pancreatic head or uncinate process. Imaging features  are nonspecific. If there is clinical concern for ampullary lesion, ERCP  may prove helpful to further evaluate. Electronically Signed   By: Kennith CenterEric  Mansell M.D.   On: 01/10/2022 05:09  ? ?DG Abd 2 Views ? ?Resu

## 2022-01-10 NOTE — Plan of Care (Signed)
?  Problem: Clinical Measurements: ?Goal: Ability to maintain clinical measurements within normal limits will improve ?Outcome: Progressing ?Goal: Will remain free from infection ?Outcome: Progressing ?Goal: Diagnostic test results will improve ?Outcome: Progressing ?Goal: Respiratory complications will improve ?Outcome: Progressing ?Goal: Cardiovascular complication will be avoided ?Outcome: Progressing ?  ?Problem: Elimination: ?Goal: Will not experience complications related to bowel motility ?Outcome: Progressing ?Goal: Will not experience complications related to urinary retention ?Outcome: Progressing ?  ?Problem: Pain Managment: ?Goal: General experience of comfort will improve ?Outcome: Progressing ?  ?Pt is involved in and agrees with the plan of care. V/S stable. Denies any pain at this time. BM 1x noted. Voiding well.  ?

## 2022-01-24 ENCOUNTER — Telehealth (INDEPENDENT_AMBULATORY_CARE_PROVIDER_SITE_OTHER): Payer: Self-pay | Admitting: Psychiatry

## 2022-01-24 DIAGNOSIS — F902 Attention-deficit hyperactivity disorder, combined type: Secondary | ICD-10-CM

## 2022-01-24 DIAGNOSIS — F331 Major depressive disorder, recurrent, moderate: Secondary | ICD-10-CM

## 2022-01-24 MED ORDER — AMPHETAMINE-DEXTROAMPHETAMINE 20 MG PO TABS: 20.0000 mg | ORAL_TABLET | Freq: Two times a day (BID) | ORAL | 0 refills | Status: DC

## 2022-01-24 NOTE — Progress Notes (Signed)
Virtual Visit via Telephone Note ? ?I connected with Vincent [name] on 01/24/22 at  1:20 PM EDT by telephone and verified that I am speaking with the correct person using two identifiers. ? ?Location: ?Patient: Home ?Provider: Hospital ?  ?I discussed the limitations, risks, security and privacy concerns of performing an evaluation and management service by telephone and the availability of in person appointments. I also discussed with the patient that there may be a patient responsible charge related to this service. The patient expressed understanding and agreed to proceed. ? ? ?History of Present Illness: Patient seen and chart reviewed.  Patient with ADHD and recent mood disorder.  Contract ended on Friday.  He is not depressed by it.  In fact he emphasizes that he is feeling very upbeat and positive.  No evidence of psychosis no bizarre thinking.  Stays on topic during the conversation.  Focusing well.  No physical side effects ? ?  ?Observations/Objective: The telephone connection was not very good but I was able to have a reasonable conversation with him.  No evidence of delusional or psychotic thinking no suicidal ideation.  Tolerating medicine well. ? ? ?Assessment and Plan: I will consider the possibility this may represent a hypomania although he always sounds upbeat and enthusiastic and less he is very depressed.  Had not previously been diagnosed with bipolar.  Supportive therapy.  Review medicine.  Continue medicine follow-up 4 weeks ? ? ?Follow Up Instructions: ? ?  ?I discussed the assessment and treatment plan with the patient. The patient was provided an opportunity to ask questions and all were answered. The patient agreed with the plan and demonstrated an understanding of the instructions. ?  ?The patient was advised to call back or seek an in-person evaluation if the symptoms worsen or if the condition fails to improve as anticipated. ? ?I provided 20 minutes of non-face-to-face time during this  encounter. ? ? ?[name], MD  ?

## 2022-03-02 ENCOUNTER — Telehealth: Payer: Self-pay

## 2022-03-02 NOTE — Telephone Encounter (Signed)
Medication management - Telephone call with patient who stated he needed a new appointment to be scheduled with Dr. Toni Amend as states he will need refills soon.  Agreed to call pt back with an appointment time.  Patient last seen 01/24/22 and stated for now he is seeing Dr. Toni Amend monthly.

## 2022-03-07 ENCOUNTER — Telehealth (INDEPENDENT_AMBULATORY_CARE_PROVIDER_SITE_OTHER): Payer: Self-pay | Admitting: Psychiatry

## 2022-03-07 DIAGNOSIS — F902 Attention-deficit hyperactivity disorder, combined type: Secondary | ICD-10-CM

## 2022-03-07 MED ORDER — AMPHETAMINE-DEXTROAMPHETAMINE 20 MG PO TABS: 20.0000 mg | ORAL_TABLET | Freq: Two times a day (BID) | ORAL | 0 refills | Status: DC

## 2022-03-07 MED ORDER — ESCITALOPRAM OXALATE 10 MG PO TABS: 10.0000 mg | ORAL_TABLET | Freq: Every day | ORAL | 1 refills | Status: DC

## 2022-03-07 NOTE — Progress Notes (Signed)
Virtual Visit via Video Note  I connected with Vincent [name] on 03/07/22 at  2:40 PM EDT by a video enabled telemedicine application and verified that I am speaking with the correct person using two identifiers.  Location: Patient: Home Provider: Hospital   I discussed the limitations of evaluation and management by telemedicine and the availability of in person appointments. The patient expressed understanding and agreed to proceed.  History of Present Illness: Patient reached by telephone.  Patient reports he is feeling good.  Although he has not gotten a full-time job he is looking and feels optimistic.  Doing some door dashing at times.  Upbeat.  Still sleeping okay.  No sign of psychosis.  Denies suicidal thoughts.    Observations/Objective: Mood sounds good thoughts sound lucid no evidence of disorganized thinking.   Assessment and Plan: Continue current medicine and follow up in a month   Follow Up Instructions:    I discussed the assessment and treatment plan with the patient. The patient was provided an opportunity to ask questions and all were answered. The patient agreed with the plan and demonstrated an understanding of the instructions.   The patient was advised to call back or seek an in-person evaluation if the symptoms worsen or if the condition fails to improve as anticipated.  I provided 15 minutes of non-face-to-face time during this encounter.   Alethia Berthold, MD

## 2022-04-26 ENCOUNTER — Encounter: Payer: Self-pay | Admitting: Emergency Medicine

## 2022-04-26 DIAGNOSIS — R1032 Left lower quadrant pain: Secondary | ICD-10-CM | POA: Insufficient documentation

## 2022-04-26 DIAGNOSIS — Z5321 Procedure and treatment not carried out due to patient leaving prior to being seen by health care provider: Secondary | ICD-10-CM | POA: Insufficient documentation

## 2022-04-26 DIAGNOSIS — R197 Diarrhea, unspecified: Secondary | ICD-10-CM | POA: Insufficient documentation

## 2022-04-26 LAB — COMPREHENSIVE METABOLIC PANEL
ALT: 12 U/L (ref 0–44)
AST: 18 U/L (ref 15–41)
Albumin: 4 g/dL (ref 3.5–5.0)
Alkaline Phosphatase: 53 U/L (ref 38–126)
Anion gap: 3 — ABNORMAL LOW (ref 5–15)
BUN: 13 mg/dL (ref 6–20)
CO2: 28 mmol/L (ref 22–32)
Calcium: 8.8 mg/dL — ABNORMAL LOW (ref 8.9–10.3)
Chloride: 110 mmol/L (ref 98–111)
Creatinine, Ser: 1.01 mg/dL (ref 0.61–1.24)
GFR, Estimated: >60 mL/min (ref >60)
Glucose, Bld: 91 mg/dL (ref 70–99)
Potassium: 3.8 mmol/L (ref 3.5–5.1)
Sodium: 141 mmol/L (ref 135–145)
Total Bilirubin: 0.7 mg/dL (ref 0.3–1.2)
Total Protein: 6.6 g/dL (ref 6.5–8.1)

## 2022-04-26 LAB — URINALYSIS, ROUTINE W REFLEX MICROSCOPIC
Bilirubin Urine: NEGATIVE
Glucose, UA: NEGATIVE mg/dL
Hgb urine dipstick: NEGATIVE
Ketones, ur: NEGATIVE mg/dL
Nitrite: NEGATIVE
Protein, ur: NEGATIVE mg/dL
Specific Gravity, Urine: 1.018 (ref 1.005–1.030)
Squamous Epithelial / HPF: NONE SEEN (ref 0–5)
pH: 7 (ref 5.0–8.0)

## 2022-04-26 LAB — CBC
HCT: 43.3 % (ref 39.0–52.0)
Hemoglobin: 14.2 g/dL (ref 13.0–17.0)
MCH: 30.9 pg (ref 26.0–34.0)
MCHC: 32.8 g/dL (ref 30.0–36.0)
MCV: 94.1 fL (ref 80.0–100.0)
Platelets: 261 10*3/uL (ref 150–400)
RBC: 4.6 MIL/uL (ref 4.22–5.81)
RDW: 13.5 % (ref 11.5–15.5)
WBC: 6.3 10*3/uL (ref 4.0–10.5)
nRBC: 0 % (ref 0.0–0.2)

## 2022-04-26 LAB — LIPASE, BLOOD: Lipase: 48 U/L (ref 11–51)

## 2022-04-26 NOTE — ED Triage Notes (Signed)
Pt presents via POV with complaints of LLQ pain that started earlier today. Hx of diverticulitis. Pt endorses one episode of diarrhea about 1.5 days ago. Denies N/V, fevers, urinary sx, SOB or CP.

## 2022-04-27 ENCOUNTER — Emergency Department
Admission: EM | Admit: 2022-04-27 | Discharge: 2022-04-27 | Discharge status: Against Medical Advice | Payer: Self-pay | Attending: Emergency Medicine | Admitting: Emergency Medicine

## 2022-04-27 NOTE — ED Notes (Addendum)
No answer when called in WR.

## 2022-04-27 NOTE — ED Notes (Signed)
No answer when called in WR. Telephone number listed send to recording without voicemail.

## 2022-04-27 NOTE — ED Notes (Signed)
No answer when called in WR.

## 2022-05-22 ENCOUNTER — Ambulatory Visit: Payer: Self-pay | Admitting: Gastroenterology

## 2022-06-08 ENCOUNTER — Telehealth (INDEPENDENT_AMBULATORY_CARE_PROVIDER_SITE_OTHER): Payer: Self-pay | Admitting: Psychiatry

## 2022-06-08 DIAGNOSIS — F331 Major depressive disorder, recurrent, moderate: Secondary | ICD-10-CM

## 2022-06-08 DIAGNOSIS — F902 Attention-deficit hyperactivity disorder, combined type: Secondary | ICD-10-CM

## 2022-06-08 DIAGNOSIS — F32A Depression, unspecified: Secondary | ICD-10-CM

## 2022-06-08 MED ORDER — AMPHETAMINE-DEXTROAMPHETAMINE 20 MG PO TABS: 20.0000 mg | ORAL_TABLET | Freq: Two times a day (BID) | ORAL | 0 refills | Status: DC

## 2022-06-08 NOTE — Progress Notes (Signed)
Virtual Visit via Telephone Note  I connected with Vincent [name] on 06/08/22 at  1:00 PM EDT by telephone and verified that I am speaking with the correct person using two identifiers.  Location: Patient: Home Provider: Hospital   I discussed the limitations, risks, security and privacy concerns of performing an evaluation and management service by telephone and the availability of in person appointments. I also discussed with the patient that there may be a patient responsible charge related to this service. The patient expressed understanding and agreed to proceed.   History of Present Illness: Patient reached by telephone.  Identities established.  Patient has not spoken to me for a couple months because he lost his job and has been without insurance.  He still is without work but is struggling hard to find a new job.  He has noticed of course that his productivity and focus are much worse when he is not taking his Adderall and would like to get back on his medicine.  No new complaints are presented.  Mood is reported as stable.  Denies being depressed.  Says he is still healthy sleeping well and eating well.    Observations/Objective: Patient sounds the same as he usually does when he is doing well.  Appropriate range of affect.  Upbeat.  Positive thinking.  No evidence of thought disorder or delusions no report of any suicidal thought   Assessment and Plan: Reminded patient of the seriousness of his depression and encouraged him to make sure he was eating well and getting enough sleep taking care of himself and would let me know if things got worse.  He requested that I refill the Adderall for only 2 weeks because of cost at this point and said he will call in 2 weeks to ask for another prescription which is fine with me.  We have made an appointment at his request in another month.  The Lexapro prescription is still intact.   Follow Up Instructions:    I discussed the assessment and  treatment plan with the patient. The patient was provided an opportunity to ask questions and all were answered. The patient agreed with the plan and demonstrated an understanding of the instructions.   The patient was advised to call back or seek an in-person evaluation if the symptoms worsen or if the condition fails to improve as anticipated.  I provided 20 minutes of non-face-to-face time during this encounter.   [name], MD   I reviewed the findings, history, assessment and plan.  Dr. Toni Amend is no longer because and I am signing this chart is a chief of psychiatry and medical directive.  I agree with the disposition and treatment plan.   Kathryne Sharper, MD

## 2022-06-20 ENCOUNTER — Other Ambulatory Visit: Payer: Self-pay | Admitting: Psychiatry

## 2022-06-20 MED ORDER — AMPHETAMINE-DEXTROAMPHETAMINE 20 MG PO TABS: 20.0000 mg | ORAL_TABLET | Freq: Two times a day (BID) | ORAL | 0 refills | Status: DC

## 2022-06-26 ENCOUNTER — Other Ambulatory Visit: Payer: Self-pay | Admitting: Psychiatry

## 2022-06-26 MED ORDER — CLONAZEPAM 0.5 MG PO TABS: 0.5000 mg | ORAL_TABLET | Freq: Three times a day (TID) | ORAL | 0 refills | Status: DC | PRN

## 2022-06-26 NOTE — Progress Notes (Signed)
Spoke with patient by telephone.  He had called in stating that he was having a panic attack.  Patient is extremely anxious.  Has been unable to find a job and is feeling overwhelmed.  No sign of psychosis that I could tell.  Denies any suicidal intent.  Responded well to conversation although remained anxious.  We agreed to talk again in another couple of days and meanwhile I have put in a prescription for clonazepam 0.5 mg every 8 hours as needed for anxiety with only 30 pills and no refills.  Patient is advised not to mix with alcohol or take more than prescribed.

## 2022-06-28 ENCOUNTER — Other Ambulatory Visit: Payer: Self-pay | Admitting: Psychiatry

## 2022-06-28 NOTE — Progress Notes (Signed)
I received a phone message today through the office.  The patient's sister called and expressed a concern that he was overusing the clonazepam that had been prescribed yesterday and that there were an excessive number missing from the bottle.  I returned the telephone call to the patient rather than the sister because I did not have direct consent to speak with the sister.  Patient did not answer his telephone and there was no availability for a voicemail message.  I will continue to try to reach him.

## 2022-06-29 ENCOUNTER — Other Ambulatory Visit: Payer: Self-pay | Admitting: Psychiatry

## 2022-06-29 NOTE — Progress Notes (Signed)
Today I once again attempted to call the patient on his cell phone.  This was to discuss his general well being and also check on the concerns expressed by his sister.  Phone rang until it went to voicemail at which time it stated that no mailbox was set up so I was not able to leave a message.

## 2022-06-30 ENCOUNTER — Emergency Department
Admission: EM | Admit: 2022-06-30 | Discharge: 2022-06-30 | Disposition: A | Discharge status: Other Acute Inpatient Hospital | Payer: 59 | Attending: Emergency Medicine | Admitting: Emergency Medicine

## 2022-06-30 ENCOUNTER — Encounter: Payer: Self-pay | Admitting: Emergency Medicine

## 2022-06-30 ENCOUNTER — Inpatient Hospital Stay
Admission: AD | Admit: 2022-06-30 | Discharge: 2022-07-04 | DRG: 885 | Disposition: A | Discharge status: Home / Self Care | Payer: 59 | Source: Intra-hospital | Attending: Psychiatry | Admitting: Psychiatry

## 2022-06-30 ENCOUNTER — Encounter: Payer: Self-pay | Admitting: Psychiatry

## 2022-06-30 ENCOUNTER — Other Ambulatory Visit: Payer: Self-pay

## 2022-06-30 DIAGNOSIS — F909 Attention-deficit hyperactivity disorder, unspecified type: Secondary | ICD-10-CM | POA: Diagnosis present

## 2022-06-30 DIAGNOSIS — Z20822 Contact with and (suspected) exposure to covid-19: Secondary | ICD-10-CM | POA: Insufficient documentation

## 2022-06-30 DIAGNOSIS — F902 Attention-deficit hyperactivity disorder, combined type: Secondary | ICD-10-CM | POA: Diagnosis present

## 2022-06-30 DIAGNOSIS — Z87891 Personal history of nicotine dependence: Secondary | ICD-10-CM

## 2022-06-30 DIAGNOSIS — F332 Major depressive disorder, recurrent severe without psychotic features: Secondary | ICD-10-CM | POA: Diagnosis present

## 2022-06-30 DIAGNOSIS — Z818 Family history of other mental and behavioral disorders: Secondary | ICD-10-CM | POA: Diagnosis not present

## 2022-06-30 DIAGNOSIS — R45851 Suicidal ideations: Secondary | ICD-10-CM

## 2022-06-30 DIAGNOSIS — G4700 Insomnia, unspecified: Secondary | ICD-10-CM | POA: Diagnosis present

## 2022-06-30 LAB — URINE DRUG SCREEN, QUALITATIVE (ARMC ONLY)
Amphetamines, Ur Screen: NOT DETECTED
Barbiturates, Ur Screen: NOT DETECTED
Benzodiazepine, Ur Scrn: NOT DETECTED
Cannabinoid 50 Ng, Ur ~~LOC~~: POSITIVE — AB
Cocaine Metabolite,Ur ~~LOC~~: NOT DETECTED
MDMA (Ecstasy)Ur Screen: NOT DETECTED
Methadone Scn, Ur: NOT DETECTED
Opiate, Ur Screen: NOT DETECTED
Phencyclidine (PCP) Ur S: NOT DETECTED
Tricyclic, Ur Screen: NOT DETECTED

## 2022-06-30 LAB — CBC
HCT: 40.2 % (ref 39.0–52.0)
Hemoglobin: 13.1 g/dL (ref 13.0–17.0)
MCH: 31.6 pg (ref 26.0–34.0)
MCHC: 32.6 g/dL (ref 30.0–36.0)
MCV: 97.1 fL (ref 80.0–100.0)
Platelets: 282 10*3/uL (ref 150–400)
RBC: 4.14 MIL/uL — ABNORMAL LOW (ref 4.22–5.81)
RDW: 13.2 % (ref 11.5–15.5)
WBC: 6.2 10*3/uL (ref 4.0–10.5)
nRBC: 0 % (ref 0.0–0.2)

## 2022-06-30 LAB — COMPREHENSIVE METABOLIC PANEL
ALT: 15 U/L (ref 0–44)
AST: 18 U/L (ref 15–41)
Albumin: 3.4 g/dL — ABNORMAL LOW (ref 3.5–5.0)
Alkaline Phosphatase: 51 U/L (ref 38–126)
Anion gap: 4 — ABNORMAL LOW (ref 5–15)
BUN: 18 mg/dL (ref 6–20)
CO2: 25 mmol/L (ref 22–32)
Calcium: 8.6 mg/dL — ABNORMAL LOW (ref 8.9–10.3)
Chloride: 113 mmol/L — ABNORMAL HIGH (ref 98–111)
Creatinine, Ser: 0.76 mg/dL (ref 0.61–1.24)
GFR, Estimated: >60 mL/min (ref >60)
Glucose, Bld: 87 mg/dL (ref 70–99)
Potassium: 3.6 mmol/L (ref 3.5–5.1)
Sodium: 142 mmol/L (ref 135–145)
Total Bilirubin: 0.2 mg/dL — ABNORMAL LOW (ref 0.3–1.2)
Total Protein: 5.4 g/dL — ABNORMAL LOW (ref 6.5–8.1)

## 2022-06-30 LAB — RESP PANEL BY RT-PCR (FLU A&B, COVID) ARPGX2
Influenza A by PCR: NEGATIVE
Influenza B by PCR: NEGATIVE
SARS Coronavirus 2 by RT PCR: NEGATIVE

## 2022-06-30 LAB — ACETAMINOPHEN LEVEL: Acetaminophen (Tylenol), Serum: <10 ug/mL — ABNORMAL LOW (ref 10–30)

## 2022-06-30 LAB — SALICYLATE LEVEL: Salicylate Lvl: <7 mg/dL — ABNORMAL LOW (ref 7.0–30.0)

## 2022-06-30 LAB — ETHANOL: Alcohol, Ethyl (B): <10 mg/dL (ref <10)

## 2022-06-30 MED ORDER — MAGNESIUM HYDROXIDE 400 MG/5ML PO SUSP: 30.0000 mL | Freq: Every day | ORAL | Status: DC | PRN

## 2022-06-30 MED ORDER — ACETAMINOPHEN 325 MG PO TABS
650.0000 mg | ORAL_TABLET | Freq: Four times a day (QID) | ORAL | Status: DC | PRN
  Administered 2022-07-02 (×2): 650 mg via ORAL
  Filled 2022-06-30 (×2): qty 2

## 2022-06-30 MED ORDER — ESCITALOPRAM OXALATE 10 MG PO TABS
10.0000 mg | ORAL_TABLET | Freq: Every day | ORAL | Status: DC
  Administered 2022-06-30 – 2022-07-01 (×2): 10 mg via ORAL
  Filled 2022-06-30 (×2): qty 1

## 2022-06-30 MED ORDER — NICOTINE 21 MG/24HR TD PT24
21.0000 mg | MEDICATED_PATCH | Freq: Every day | TRANSDERMAL | Status: DC
  Administered 2022-07-01 – 2022-07-04 (×4): 21 mg via TRANSDERMAL
  Filled 2022-06-30 (×4): qty 1

## 2022-06-30 MED ORDER — CLONAZEPAM 0.5 MG PO TABS
0.5000 mg | ORAL_TABLET | ORAL | Status: AC
  Administered 2022-06-30: 0.5 mg via ORAL
  Filled 2022-06-30: qty 1

## 2022-06-30 MED ORDER — AMPHETAMINE-DEXTROAMPHET ER 5 MG PO CP24
20.0000 mg | ORAL_CAPSULE | Freq: Every day | ORAL | Status: DC
  Administered 2022-06-30: 20 mg via ORAL
  Filled 2022-06-30: qty 4

## 2022-06-30 MED ORDER — QUETIAPINE FUMARATE 100 MG PO TABS
100.0000 mg | ORAL_TABLET | Freq: Every day | ORAL | Status: DC
  Administered 2022-06-30 – 2022-07-03 (×4): 100 mg via ORAL
  Filled 2022-06-30 (×4): qty 1

## 2022-06-30 MED ORDER — HYDROXYZINE HCL 50 MG PO TABS
50.0000 mg | ORAL_TABLET | Freq: Three times a day (TID) | ORAL | Status: DC | PRN
  Filled 2022-06-30: qty 1

## 2022-06-30 MED ORDER — LORAZEPAM 1 MG PO TABS
1.0000 mg | ORAL_TABLET | Freq: Once | ORAL | Status: AC
  Administered 2022-06-30: 1 mg via ORAL
  Filled 2022-06-30: qty 1

## 2022-06-30 MED ORDER — CLONAZEPAM 0.5 MG PO TABS: 0.5000 mg | ORAL_TABLET | Freq: Two times a day (BID) | ORAL | Status: DC

## 2022-06-30 MED ORDER — NICOTINE 21 MG/24HR TD PT24
21.0000 mg | MEDICATED_PATCH | Freq: Every day | TRANSDERMAL | Status: DC
  Administered 2022-06-30: 21 mg via TRANSDERMAL
  Filled 2022-06-30: qty 1

## 2022-06-30 MED ORDER — CLONAZEPAM 0.5 MG PO TABS
0.5000 mg | ORAL_TABLET | Freq: Two times a day (BID) | ORAL | Status: DC
  Administered 2022-06-30 – 2022-07-04 (×8): 0.5 mg via ORAL
  Filled 2022-06-30 (×8): qty 1

## 2022-06-30 MED ORDER — TRAZODONE HCL 100 MG PO TABS
100.0000 mg | ORAL_TABLET | Freq: Every evening | ORAL | Status: DC | PRN
  Administered 2022-07-02 – 2022-07-03 (×2): 100 mg via ORAL
  Filled 2022-06-30 (×4): qty 1

## 2022-06-30 MED ORDER — ALUM & MAG HYDROXIDE-SIMETH 200-200-20 MG/5ML PO SUSP: 30.0000 mL | ORAL | Status: DC | PRN

## 2022-06-30 MED ORDER — AMPHETAMINE-DEXTROAMPHET ER 5 MG PO CP24
20.0000 mg | ORAL_CAPSULE | Freq: Every day | ORAL | Status: DC
  Administered 2022-07-01 – 2022-07-04 (×4): 20 mg via ORAL
  Filled 2022-06-30 (×4): qty 4

## 2022-06-30 NOTE — ED Notes (Signed)
Pt rude to staff coming out demanding his phone be given back to him. This RN attempted to explain rules, pt states "I'm not doing it and youre going to give me medication or I'm going to cause trouble". Security at bedside with this RN. This RN explained process of behaviorial area, pt still demanding medication that he takes every 8 hours at home. When asked what medication, pt states "you figure it out".

## 2022-06-30 NOTE — ED Provider Notes (Signed)
[facility] Provider Note    Event Date/Time   First MD Initiated Contact with Patient 06/30/22 [phone]     (approximate)   History   Suicidal   HPI  Vincent [name] is a 56 y.o. male with a history of anxiety, depression, and ADHD who presents with increased anxiety and depression after he recently lost his job and has been struggling to find a new job in the last few months.  The patient states that he feels like he wants to "go away" and not be a burden on others.  He feels like he may run away and try to harm himself.  He states he is compliant with his Lexapro and Adderall.  He states he also takes Xanax.  He denies any acute medical complaints.    Physical Exam   Triage Vital Signs: ED Triage Vitals  Enc Vitals Group     BP 06/30/22 0910 (!) 145/84     Pulse Rate 06/30/22 0910 84     Resp 06/30/22 0910 17     Temp 06/30/22 0910 98.5 F (36.9 C)     Temp Source 06/30/22 0910 Oral     SpO2 06/30/22 0910 98 %     Weight --      Height --      Head Circumference --      Peak Flow --      Pain Score 06/30/22 0916 6     Pain Loc --      Pain Edu? --      Excl. in GC? --     Most recent vital signs: Vitals:   06/30/22 0910  BP: (!) 145/84  Pulse: 84  Resp: 17  Temp: 98.5 F (36.9 C)  SpO2: 98%     General: Awake, no distress.  CV:  Good peripheral perfusion.  Resp:  Normal effort.  Abd:  No distention.  Other:  Anxious appearing, tearful.   ED Results / Procedures / Treatments   Labs (all labs ordered are listed, but only abnormal results are displayed) Labs Reviewed  COMPREHENSIVE METABOLIC PANEL - Abnormal; Notable for the following components:      Result Value   Chloride 113 (*)    Calcium 8.6 (*)    Total Protein 5.4 (*)    Albumin 3.4 (*)    Total Bilirubin 0.2 (*)    Anion gap 4 (*)    All other components within normal limits  SALICYLATE LEVEL - Abnormal; Notable for the following components:   Salicylate Lvl <7.0  (*)    All other components within normal limits  ACETAMINOPHEN LEVEL - Abnormal; Notable for the following components:   Acetaminophen (Tylenol), Serum <10 (*)    All other components within normal limits  CBC - Abnormal; Notable for the following components:   RBC 4.14 (*)    All other components within normal limits  URINE DRUG SCREEN, QUALITATIVE ([facility] ONLY) - Abnormal; Notable for the following components:   Cannabinoid 50 Ng, Ur [place] POSITIVE (*)    All other components within normal limits  RESP PANEL BY RT-PCR (FLU A&B, COVID) ARPGX2  ETHANOL     EKG    RADIOLOGY    PROCEDURES:  Critical Care performed: No  Procedures   MEDICATIONS ORDERED IN ED: Medications  nicotine (NICODERM CQ - dosed in mg/24 hours) patch 21 mg (has no administration in time range)  clonazePAM (KLONOPIN) tablet 0.5 mg (has no administration in time range)  amphetamine-dextroamphetamine (ADDERALL XR) 24 hr capsule 20 mg (has no administration in time range)  clonazePAM (KLONOPIN) tablet 0.5 mg (has no administration in time range)  LORazepam (ATIVAN) tablet 1 mg (1 mg Oral Given 06/30/22 0944)     IMPRESSION / MDM / ASSESSMENT AND PLAN / ED COURSE  I reviewed the triage vital signs and the nursing notes.  56 year old male with a history of anxiety and depression presents with increased depression and anxiety, and suicidal ideation.  I reviewed the past medical records.  The patient had a telehealth visit with Dr. Weber Cooks from psychiatry on 9/14 at which point he was put back on Adderall.  Differential diagnosis includes, but is not limited to, major depressive disorder, adjustment disorder, substance-induced mood disorder.  Patient's presentation is most consistent with acute presentation with potential threat to life or bodily function.  At this time, the patient expresses suicidal ideation and is unable to contract for safety; he states he feels like he may try to run out of the ED and  he is not sure what he will do.  Therefore at this time the patient is an acute danger to self and I have placed him under a commitment.  I have ordered psychiatry and TTS consults.  _______________________  The patient has been placed in psychiatric observation due to the need to provide a safe environment for the patient while obtaining psychiatric consultation and evaluation, as well as ongoing medical and medication management to treat the patient's condition.  The patient has been placed under full IVC at this time.   ----------------------------------------- 4:12 PM on 06/30/2022 -----------------------------------------  I consulted with Dr. Weber Cooks who recommends inpatient psychiatric admission.   UDS is positive for cannabinoids.  Ethanol, acetaminophen, and salicylate levels are negative.  FINAL CLINICAL IMPRESSION(S) / ED DIAGNOSES   Final diagnoses:  Suicidal ideation     Rx / DC Orders   ED Discharge Orders     None        Note:  This document was prepared using Dragon voice recognition software and may include unintentional dictation errors.    Arta Silence, MD 06/30/22 [phone]

## 2022-06-30 NOTE — BH Assessment (Signed)
Comprehensive Clinical Assessment (CCA) Screening, Triage and Referral Note  06/30/2022 KOY VONDRAN [id number]  Chief Complaint:  Chief Complaint  Patient presents with   Suicidal   Visit Diagnosis: Major Depression  Vincent [name] is a 56 year old male who presents to the ER due to thoughts of ending his life. He states he felt this way for a while but "Saturday, is when it hit the fan." Patient further explained, he will not do anything to his self because he is catholic but it doesn't change the fact he wants to die. He reports, he no longer fit in the world and would be better off dead.  Patient Reported Information How did you hear about [place]? Self  What Is the Reason for Your Visit/Call Today? Having thoughts of dying but not plan, gestures or attempts  How Long Has This Been Causing You Problems? 1 wk - 1 month  What Do You Feel Would Help You the Most Today? Treatment for Depression or other mood problem   Have You Recently Had Any Thoughts About Hurting Yourself? Yes  Are You Planning to Commit Suicide/Harm Yourself At This time? No   Have you Recently Had Thoughts About [place]? No  Are You Planning to Harm Someone at This Time? No  Explanation: No data recorded  Have You Used Any Alcohol or Drugs in the Past 24 Hours? No  How Long Ago Did You Use Drugs or Alcohol? No data recorded What Did You Use and How Much? No data recorded  Do You Currently Have a Therapist/Psychiatrist? No  Name of Therapist/Psychiatrist: No data recorded  Have You Been Recently Discharged From Any Office Practice or Programs? No  Explanation of Discharge From Practice/Program: No data recorded   CCA Screening Triage Referral Assessment Type of Contact: Face-to-Face  Telemedicine Service Delivery:   Is this Initial or Reassessment? No data recorded Date Telepsych consult ordered in [facility]:  No data recorded Time Telepsych consult ordered in [facility]:  No data  recorded Location of Assessment: [facility] ED  Provider Location: [facility] ED   Collateral Involvement: No data recorded  Does Patient Have a [place]? No data recorded Name and Contact of Legal Guardian: No data recorded If Minor and Not Living with Parent(s), Who has Custody? No data recorded Is CPS involved or ever been involved? Never  Is APS involved or ever been involved? Never   Patient Determined To Be At Risk for Harm To Self or Others Based on Review of Patient Reported Information or Presenting Complaint? No  Method: No data recorded Availability of Means: No data recorded Intent: No data recorded Notification Required: No data recorded Additional Information for Danger to Others Potential: No data recorded Additional Comments for Danger to Others Potential: No data recorded Are There Guns or Other Weapons in Your Home? No data recorded Types of Guns/Weapons: No data recorded Are These Weapons Safely Secured?                            No data recorded Who Could Verify You Are Able To Have These Secured: No data recorded Do You Have any Outstanding Charges, Pending Court Dates, Parole/Probation? No data recorded Contacted To Inform of Risk of Harm To Self or Others: No data recorded  Does Patient Present under Involuntary Commitment? Yes  IVC Papers Initial File Date: 06/30/22   [place] of Residence: [place]   Patient Currently Receiving the Following Services:  Medication Management   Determination of Need: Emergent (2 hours)   Options For Referral: ED Visit   Discharge Disposition:     Gunnar Fusi MS, LCAS, [facility], [place] Therapeutic Triage Specialist 06/30/2022 1:27 PM

## 2022-06-30 NOTE — Consult Note (Signed)
[facility]   Reason for Consult: Consult for 56 year old man with a history of depression and ADHD brought to the hospital today by family out of concern for suicidal ideation Referring Physician: Derrill KayGoodman Patient Identification: Vincent [name] MRN:  [id number] Principal Diagnosis: Severe recurrent major depression without psychotic features (HCC) Diagnosis:  Principal Problem:   Severe recurrent major depression without psychotic features (HCC) Active Problems:   ADHD (attention deficit hyperactivity disorder), combined type   History of tobacco abuse   Suicidal ideation   Total Time spent with patient: 45 minutes  Subjective:   Vincent [name] is a 56 y.o. male patient admitted with "I just do not know what to do".  HPI: Patient seen and chart reviewed.  56 year old man with a history of depression and ADHD.  I am familiar with him as he is a longstanding outpatient of mine.  Patient's mood and anxiety have been getting rapidly worse over the last month or more since he was laid off his most recent job and has had difficulty finding new work.  He is not eating well.  Sleep Malinda is erratic.  Mood is very anxious.  I had given him a prescription for some clonazepam the other day.  Sister was concerned he had used too much of it although he claims he had not done so.  Patient was expressing passive suicidal ideation and no specific intent or plan.  No psychosis.  Affect is extremely anxious and somewhat distraught.  Past Psychiatric History: Past history of longstanding ADHD that had previously been stable and with intermittent spells of severe depression and anxiety  Risk to Self:   Risk to Others:   Prior Inpatient Therapy:   Prior Outpatient Therapy:    Past Medical History:  Past Medical History:  Diagnosis Date   ADHD (attention deficit hyperactivity disorder)    Asthma     Past Surgical History:  Procedure Laterality Date   CHOLECYSTECTOMY     COLON  SURGERY     Family History:  Family History  Problem Relation Age of Onset   Diabetes Mother    Alzheimer's disease Mother    Depression Sister    Hypertension Father    Family Psychiatric  History: See previous Social History:  Social History   Substance and Sexual Activity  Alcohol Use Yes   Comment: special - occ     Social History   Substance and Sexual Activity  Drug Use No    Social History   Socioeconomic History   Marital status: Married    Spouse name: Not on file   Number of children: Not on file   Years of education: Not on file   Highest education level: Not on file  Occupational History   Not on file  Tobacco Use   Smoking status: Former    Packs/day: 0.50    Types: Cigarettes   Smokeless tobacco: Never   Tobacco comments:    "I quit months ago"  Vaping Use   Vaping Use: Never used  Substance and Sexual Activity   Alcohol use: Yes    Comment: special - occ   Drug use: No   Sexual activity: Never  Other Topics Concern   Not on file  Social History Narrative   Not on file   Social Determinants of Health   Financial Resource Strain: Low Risk  (12/06/2017)   Overall Financial Resource Strain (CARDIA)    Difficulty of Paying Living Expenses: Not hard at all  Food Insecurity: No Food Insecurity (12/06/2017)   Hunger Vital Sign    Worried About Running Out of Food in the Last Year: Never true    Ran Out of Food in the Last Year: Never true  Transportation Needs: No Transportation Needs (12/06/2017)   PRAPARE - Administrator, Civil Service (Medical): No    Lack of Transportation (Non-Medical): No  Physical Activity: Not on file  Stress: Not on file  Social Connections: Not on file   Additional Social History:    Allergies:  No Known Allergies  Labs:  Results for orders placed or performed during the hospital encounter of 06/30/22 (from the past 48 hour(s))  Resp Panel by RT-PCR (Flu A&B, Covid) Anterior Nasal Swab     Status:  None   Collection Time: 06/30/22  9:21 AM   Specimen: Anterior Nasal Swab  Result Value Ref Range   SARS Coronavirus 2 by RT PCR NEGATIVE NEGATIVE    Comment: (NOTE) SARS-CoV-2 target nucleic acids are NOT DETECTED.  The SARS-CoV-2 RNA is generally detectable in upper respiratory specimens during the acute phase of infection. The lowest concentration of SARS-CoV-2 viral copies this assay can detect is 138 copies/mL. A negative result does not preclude SARS-Cov-2 infection and should not be used as the sole basis for treatment or other patient management decisions. A negative result may occur with  improper specimen collection/handling, submission of specimen other than nasopharyngeal swab, presence of viral mutation(s) within the areas targeted by this assay, and inadequate number of viral copies(<138 copies/mL). A negative result must be combined with clinical observations, patient history, and epidemiological information. The expected result is Negative.  Fact Sheet for Patients:  BloggerCourse.com  Fact Sheet for Healthcare Providers:  SeriousBroker.it  This test is no t yet approved or cleared by the [place] FDA and  has been authorized for detection and/or diagnosis of SARS-CoV-2 by FDA under an Emergency Use Authorization (EUA). This EUA will remain  in effect (meaning this test can be used) for the duration of the COVID-19 declaration under Section 564(b)(1) of the Act, 21 U.S.C.section 360bbb-3(b)(1), unless the authorization is terminated  or revoked sooner.       Influenza A by PCR NEGATIVE NEGATIVE   Influenza B by PCR NEGATIVE NEGATIVE    Comment: (NOTE) The Xpert Xpress SARS-CoV-2/FLU/RSV plus assay is intended as an aid in the diagnosis of influenza from Nasopharyngeal swab specimens and should not be used as a sole basis for treatment. Nasal washings and aspirates are unacceptable for Xpert Xpress  SARS-CoV-2/FLU/RSV testing.  Fact Sheet for Patients: BloggerCourse.com  Fact Sheet for Healthcare Providers: SeriousBroker.it  This test is not yet approved or cleared by the [place] FDA and has been authorized for detection and/or diagnosis of SARS-CoV-2 by FDA under an Emergency Use Authorization (EUA). This EUA will remain in effect (meaning this test can be used) for the duration of the COVID-19 declaration under Section 564(b)(1) of the Act, 21 U.S.C. section 360bbb-3(b)(1), unless the authorization is terminated or revoked.  Performed at [facility], [address] [place], [place] [address]   Comprehensive metabolic panel     Status: Abnormal   Collection Time: 06/30/22  9:21 AM  Result Value Ref Range   Sodium 142 135 - 145 mmol/L   Potassium 3.6 3.5 - 5.1 mmol/L   Chloride 113 (H) 98 - 111 mmol/L   CO2 25 22 - 32 mmol/L   Glucose, Bld 87 70 - 99 mg/dL  Comment: Glucose reference range applies only to samples taken after fasting for at least 8 hours.   BUN 18 6 - 20 mg/dL   Creatinine, Ser 9.37 0.61 - 1.24 mg/dL   Calcium 8.6 (L) 8.9 - 10.3 mg/dL   Total Protein 5.4 (L) 6.5 - 8.1 g/dL   Albumin 3.4 (L) 3.5 - 5.0 g/dL   AST 18 15 - 41 U/L   ALT 15 0 - 44 U/L   Alkaline Phosphatase 51 38 - 126 U/L   Total Bilirubin 0.2 (L) 0.3 - 1.2 mg/dL   GFR, Estimated >90 >24 mL/min    Comment: (NOTE) Calculated using the CKD-EPI Creatinine Equation (2021)    Anion gap 4 (L) 5 - 15    Comment: Performed at [facility], [address], [place], [place] [address]  Ethanol     Status: None   Collection Time: 06/30/22  9:21 AM  Result Value Ref Range   Alcohol, Ethyl (B) <10 <10 mg/dL    Comment: (NOTE) Lowest detectable limit for serum alcohol is 10 mg/dL.  For medical purposes only. Performed at [facility], [address] [place], [place] [address]   Salicylate level     Status:  Abnormal   Collection Time: 06/30/22  9:21 AM  Result Value Ref Range   Salicylate Lvl <7.0 (L) 7.0 - 30.0 mg/dL    Comment: Performed at [facility] [address] [place], [place] [address]  Acetaminophen level     Status: Abnormal   Collection Time: 06/30/22  9:21 AM  Result Value Ref Range   Acetaminophen (Tylenol), Serum <10 (L) 10 - 30 ug/mL    Comment: (NOTE) Therapeutic concentrations vary significantly. A range of 10-30 ug/mL  may be an effective concentration for many patients. However, some  are best treated at concentrations outside of this range. Acetaminophen concentrations >150 ug/mL at 4 hours after ingestion  and >50 ug/mL at 12 hours after ingestion are often associated with  toxic reactions.  Performed at [facility], [address] [place], [place] [address]   cbc     Status: Abnormal   Collection Time: 06/30/22  9:21 AM  Result Value Ref Range   WBC 6.2 4.0 - 10.5 K/uL   RBC 4.14 (L) 4.22 - 5.81 MIL/uL   Hemoglobin 13.1 13.0 - 17.0 g/dL   HCT 22.9 79.8 - 92.1 %   MCV 97.1 80.0 - 100.0 fL   MCH 31.6 26.0 - 34.0 pg   MCHC 32.6 30.0 - 36.0 g/dL   RDW 19.4 17.4 - 08.1 %   Platelets 282 150 - 400 K/uL   nRBC 0.0 0.0 - 0.2 %    Comment: Performed at [facility], [address] [place], [place] [address]  Urine Drug Screen, Qualitative     Status: Abnormal   Collection Time: 06/30/22  9:47 AM  Result Value Ref Range   Tricyclic, Ur Screen NONE DETECTED NONE DETECTED   Amphetamines, Ur Screen NONE DETECTED NONE DETECTED   MDMA (Ecstasy)Ur Screen NONE DETECTED NONE DETECTED   Cocaine Metabolite,Ur Hoven NONE DETECTED NONE DETECTED   Opiate, Ur Screen NONE DETECTED NONE DETECTED   Phencyclidine (PCP) Ur S NONE DETECTED NONE DETECTED   Cannabinoid 50 Ng, Ur Lamb POSITIVE (A) NONE DETECTED   Barbiturates, Ur Screen NONE DETECTED NONE DETECTED   Benzodiazepine, Ur Scrn NONE DETECTED NONE DETECTED   Methadone Scn, Ur NONE DETECTED  NONE DETECTED    Comment: (NOTE) Tricyclics +  metabolites, urine    Cutoff 1000 ng/mL Amphetamines + metabolites, urine  Cutoff 1000 ng/mL MDMA (Ecstasy), urine              Cutoff 500 ng/mL Cocaine Metabolite, urine          Cutoff 300 ng/mL Opiate + metabolites, urine        Cutoff 300 ng/mL Phencyclidine (PCP), urine         Cutoff 25 ng/mL Cannabinoid, urine                 Cutoff 50 ng/mL Barbiturates + metabolites, urine  Cutoff 200 ng/mL Benzodiazepine, urine              Cutoff 200 ng/mL Methadone, urine                   Cutoff 300 ng/mL  The urine drug screen provides only a preliminary, unconfirmed analytical test result and should not be used for non-medical purposes. Clinical consideration and professional judgment should be applied to any positive drug screen result due to possible interfering substances. A more specific alternate chemical method must be used in order to obtain a confirmed analytical result. Gas chromatography / mass spectrometry (GC/MS) is the preferred confirm atory method. Performed at [facility], [address] [place], [place] [address]     Current Facility-Administered Medications  Medication Dose Route Frequency Provider Last Rate Last Admin   amphetamine-dextroamphetamine (ADDERALL XR) 24 hr capsule 20 mg  20 mg Oral Daily Aylissa Heinemann, Jackquline Denmark, MD       clonazePAM (KLONOPIN) tablet 0.5 mg  0.5 mg Oral NOW Jonisha Kindig, Jackquline Denmark, MD       Melene Muller ON 07/01/2022] clonazePAM (KLONOPIN) tablet 0.5 mg  0.5 mg Oral BID Danner Paulding, Jackquline Denmark, MD       nicotine (NICODERM CQ - dosed in mg/24 hours) patch 21 mg  21 mg Transdermal Daily Mabry Tift, Jackquline Denmark, MD       Current Outpatient Medications  Medication Sig Dispense Refill   amphetamine-dextroamphetamine (ADDERALL) 20 MG tablet Take 1 tablet (20 mg total) by mouth 2 (two) times daily at 10 AM and 5 PM. 30 tablet 0   clonazePAM (KLONOPIN) 0.5 MG tablet Take 1 tablet (0.5 mg total) by mouth every 8 (eight) hours  as needed for anxiety. 30 tablet 0   escitalopram (LEXAPRO) 10 MG tablet Take 1 tablet (10 mg total) by mouth daily. 90 tablet 1   albuterol (VENTOLIN HFA) 108 (90 Base) MCG/ACT inhaler Inhale 2 puffs into the lungs every 4 (four) hours as needed for wheezing or shortness of breath. 18 g 0    Musculoskeletal: Strength & Muscle Tone: within normal limits Gait & Station: normal Patient leans: N/A            Psychiatric Specialty Exam:  Presentation  General Appearance: No data recorded Eye Contact:No data recorded Speech:No data recorded Speech Volume:No data recorded Handedness:No data recorded  Mood and Affect  Mood:No data recorded Affect:No data recorded  Thought Process  Thought Processes:No data recorded Descriptions of Associations:No data recorded Orientation:No data recorded Thought Content:No data recorded History of Schizophrenia/Schizoaffective disorder:No data recorded Duration of Psychotic Symptoms:No data recorded Hallucinations:No data recorded Ideas of Reference:No data recorded Suicidal Thoughts:No data recorded Homicidal Thoughts:No data recorded  Sensorium  Memory:No data recorded Judgment:No data recorded Insight:No data recorded  Executive Functions  Concentration:No data recorded Attention Span:No data recorded Recall:No data recorded Fund of Knowledge:No data recorded Language:No data recorded  Psychomotor Activity  Psychomotor Activity:No data recorded  Assets  Assets:No data recorded  Sleep  Sleep:No data recorded  Physical Exam: Physical Exam Vitals and nursing note reviewed.  Constitutional:      Appearance: Normal appearance.  HENT:     Head: Normocephalic and atraumatic.     Mouth/Throat:     Pharynx: Oropharynx is clear.  Eyes:     Pupils: Pupils are equal, round, and reactive to light.  Cardiovascular:     Rate and Rhythm: Normal rate and regular rhythm.  Pulmonary:     Effort: Pulmonary effort is normal.      Breath sounds: Normal breath sounds.  Abdominal:     General: Abdomen is flat.     Palpations: Abdomen is soft.  Musculoskeletal:        General: Normal range of motion.  Skin:    General: Skin is warm and dry.  Neurological:     General: No focal deficit present.     Mental Status: He is alert. Mental status is at baseline.  Psychiatric:        Attention and Perception: He is inattentive.        Mood and Affect: Mood is anxious.        Speech: Speech normal.        Behavior: Behavior is not agitated or aggressive.        Thought Content: Thought content includes suicidal ideation. Thought content does not include suicidal plan.    Review of Systems  Constitutional: Negative.   HENT: Negative.    Eyes: Negative.   Respiratory: Negative.    Cardiovascular: Negative.   Gastrointestinal: Negative.   Musculoskeletal: Negative.   Skin: Negative.   Neurological: Negative.   Psychiatric/Behavioral:  Positive for depression and suicidal ideas. The patient is nervous/anxious.    Blood pressure (!) 145/84, pulse 84, temperature 98.5 F (36.9 C), temperature source Oral, resp. rate 17, height 5\' 11"  (1.803 m), weight 58 kg, SpO2 98 %. Body mass index is 17.83 kg/m.  Treatment Plan Summary: Medication management and Plan I have restarted his Lexapro and Klonopin and Adderall.  Reviewed the situation with patient and recommended admission based on his very concerning appearance of anxiety and feeling overwhelmed and tearful as well as his conscious suicidal thoughts.  Patient expresses understanding and agrees to the plan.  Case reviewed with nursing.  Orders placed for admission to inpatient psychiatric unit  Disposition: Recommend psychiatric Inpatient admission when medically cleared. Supportive therapy provided about ongoing stressors.  Alethia Berthold, MD 06/30/2022 4:34 PM

## 2022-06-30 NOTE — ED Notes (Signed)
Given  breakfast

## 2022-06-30 NOTE — ED Notes (Signed)
Report called to BMU. Pt to be transferred to BMU.

## 2022-06-30 NOTE — Progress Notes (Signed)
Patient refused to cooperate with admission process, due to not being able to have his phone. Patient cooperated with skin search and was showed to his room. MD was present on the unit while this happened and is aware.

## 2022-06-30 NOTE — ED Notes (Signed)
Lunch given.

## 2022-06-30 NOTE — [facility] Group Notes (Signed)
[facility] Notes:  (Nursing/MHT/Case Management/Adjunct)  Date:  06/30/2022  Time:  8:38 PM  Type of Therapy:   Wrap up  Participation Level:  Did Not Attend  Summary of Progress/Problems:  Vincent [name] 06/30/2022, 8:38 PM

## 2022-06-30 NOTE — ED Triage Notes (Signed)
Pt presents via POV with c/o SI. Pt not currently with plan, but reports "just wants to go away & dissapear". Pt tearful in triage. Pt denies HI. Pt denies [place]. Pt takes lexapro currently. Pt does have outpatient psychiatrist.

## 2022-06-30 NOTE — Plan of Care (Signed)
New admission.  Problem: Education: Goal: Knowledge of General Education information will improve Description: Including pain rating scale, medication(s)/side effects and non-pharmacologic comfort measures Outcome: Not Progressing   Problem: Health Behavior/Discharge Planning: Goal: Ability to manage health-related needs will improve Outcome: Not Progressing   Problem: Clinical Measurements: Goal: Ability to maintain clinical measurements within normal limits will improve Outcome: Not Progressing Goal: Will remain free from infection Outcome: Not Progressing Goal: Diagnostic test results will improve Outcome: Not Progressing Goal: Respiratory complications will improve Outcome: Not Progressing Goal: Cardiovascular complication will be avoided Outcome: Not Progressing   Problem: Activity: Goal: Risk for activity intolerance will decrease Outcome: Not Progressing   Problem: Nutrition: Goal: Adequate nutrition will be maintained Outcome: Not Progressing   Problem: Coping: Goal: Level of anxiety will decrease Outcome: Not Progressing   Problem: Elimination: Goal: Will not experience complications related to bowel motility Outcome: Not Progressing Goal: Will not experience complications related to urinary retention Outcome: Not Progressing   Problem: Pain Managment: Goal: General experience of comfort will improve Outcome: Not Progressing   Problem: Safety: Goal: Ability to remain free from injury will improve Outcome: Not Progressing   Problem: Skin Integrity: Goal: Risk for impaired skin integrity will decrease Outcome: Not Progressing   Problem: Education: Goal: Knowledge of  General Education information/materials will improve Outcome: Not Progressing Goal: Emotional status will improve Outcome: Not Progressing Goal: Mental status will improve Outcome: Not Progressing Goal: Verbalization of understanding the information provided will  improve Outcome: Not Progressing   Problem: Health Behavior/Discharge Planning: Goal: Compliance with treatment plan for underlying cause of condition will improve Outcome: Not Progressing   Problem: Safety: Goal: Periods of time without injury will increase Outcome: Not Progressing   Problem: Coping: Goal: Coping ability will improve Outcome: Not Progressing   Problem: Medication: Goal: Compliance with prescribed medication regimen will improve Outcome: Not Progressing   Problem: Self-Concept: Goal: Ability to disclose and discuss suicidal ideas will improve Outcome: Not Progressing Goal: Will verbalize positive feelings about self Outcome: Not Progressing   Problem: Coping: Goal: Coping ability will improve Outcome: Not Progressing Goal: Will verbalize feelings Outcome: Not Progressing   Problem: Self-Concept: Goal: Ability to identify factors that promote anxiety will improve Outcome: Not Progressing Goal: Level of anxiety will decrease Outcome: Not Progressing Goal: Ability to modify response to factors that promote anxiety will improve Outcome: Not Progressing

## 2022-06-30 NOTE — Tx Team (Signed)
Initial Treatment Plan 06/30/2022 6:58 PM Vincent [name] OZD:[id number]    PATIENT STRESSORS: Financial difficulties     PATIENT STRENGTHS: General fund of knowledge  Motivation for treatment/growth  Religious Affiliation  Supportive family/friends    PATIENT IDENTIFIED PROBLEMS: Suicidal Ideation  Depression  Anxiety  Financial difficulties               DISCHARGE CRITERIA:  Ability to meet basic life and health needs Improved stabilization in mood, thinking, and/or behavior Motivation to continue treatment in a less acute level of care Need for constant or close observation no longer present Reduction of life-threatening or endangering symptoms to within safe limits  PRELIMINARY DISCHARGE PLAN: Outpatient therapy Return to previous living arrangement  PATIENT/FAMILY INVOLVEMENT: This treatment plan has been presented to and reviewed with the patient, Vincent [name]. The patient has been given the opportunity to ask questions and make suggestions.  [name], RN 06/30/2022, 6:58 PM

## 2022-06-30 NOTE — ED Notes (Signed)
IVC 

## 2022-06-30 NOTE — ED Notes (Signed)
Pt moved to B[facility] from [facility].

## 2022-06-30 NOTE — Plan of Care (Signed)
Pt endorses anxiety/depression at this time. Pt denies HI/AVH or pain at this time however, endorses experiencing SI. Pt states he is always having negative thoughts. Pt states he doesn't not have a plan while here and can be safe while here. Pt is anxious but cooperative. Pt provided with support and encouragement. Pt monitored q15 minutes for safety per unit policy. Plan of care ongoing.    Problem: Education: Goal: Knowledge of General Education information will improve Description: Including pain rating scale, medication(s)/side effects and non-pharmacologic comfort measures Outcome: Not Progressing   Problem: Coping: Goal: Level of anxiety will decrease Outcome: Not Progressing

## 2022-06-30 NOTE — Progress Notes (Signed)
Patient apologized for his actions, while taking scheduled medications. Patient reported that he was told upstairs, in the ED, that once he got down here, he would be able to have his belongings. Patient also reported that he likes to listen to his audio books on his phone, due to having ADHD, and not being able to concentrate. Patient wanted this writer to also apologize to the security guard.

## 2022-06-30 NOTE — ED Notes (Signed)
This RN offered patient phone to call sister, pt states "I dont want it unless its my phone, get the hell out of here".

## 2022-06-30 NOTE — Progress Notes (Signed)
Admission Note:  Report was received from [facility] [facility], RN on a 56 year-old male who presents IVC in no acute distress for the treatment of SI and Depression. Patient was uncooperative with the admission process when brought onto the unit. However, after patient ate, he calmed down some and answered a few questions for this Probation officer. Patient endorsed SI stating that "I want to go away, but [employer], so I can't do it". 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 Patient denies HI/AVH and pain at this time. Patient reports that he's scared he's going to fall through the cracks. Patient has a past medical history of Asthma, Depression, ADHD, and Anxiety. Skin was assessed with Nira Conn, MHT and found to be clear of any abnormal marks apart from some scratch marks on his right lower leg. Patient searched and no contraband found. Patient refused to sign consents. Food and fluids offered, and both accepted. Patient remains safe on the unit.

## 2022-06-30 NOTE — ED Notes (Signed)
Pt demanding this RN take his dentures. Dentures placed in belongings bag at this time.

## 2022-07-01 DIAGNOSIS — F332 Major depressive disorder, recurrent severe without psychotic features: Secondary | ICD-10-CM

## 2022-07-01 MED ORDER — ESCITALOPRAM OXALATE 10 MG PO TABS
15.0000 mg | ORAL_TABLET | Freq: Every day | ORAL | Status: DC
  Administered 2022-07-02 – 2022-07-04 (×3): 15 mg via ORAL
  Filled 2022-07-01 (×3): qty 2

## 2022-07-01 NOTE — Plan of Care (Signed)
Pt endorses good sleep with the help of sleep medication.  Fair appetite, low energy, poor concentration.  Depression 6/10, hopelessness 8/10, anxiety 9/10.  Denies SI or HI.  Denis AVH.  On self inventory pt stated he wants to "feel more connected" and "try  not to be such an ass."  Further he stated "I am sorry.  I am ashamed of my actions yesterday."  Pt expressed verbally that he was having difficulty due to his ADHD and anxiety.  Pt was tearful at times.  Pt is frustrated that he cannot have his audiobooks.  Pt was visited by chaplain and given something to read.  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  Pt says "I need to find my purpose."    Throughout the day the pt was noticeably more calm and sociable.  Pt said he is attuned to his body when he gets anxious or his ADHD is making him unable to focus.  Continue 15 minutes checks for safety.

## 2022-07-01 NOTE — Progress Notes (Signed)
[facility]  07/01/2022 1:46 PM Arletta BaleRobert L Sahagun  MRN:  [id number] Subjective: Follow-up 56 year old man with severe depression as well as ADHD.  Spoke with patient for a while today.  Reviewed his job situation which is frustrating but has also led him to be more depressed probably worsening his candidacy for work.  No active suicidal intent but has a certain hopelessness still in a lot of the things he is feeling Principal Problem: Severe recurrent major depression without psychotic features (HCC) Diagnosis: Principal Problem:   Severe recurrent major depression without psychotic features (HCC)  Total Time spent with patient: 30 minutes  Past Psychiatric History: Past history of episodes of severe depression and anxiety on top of chronic ADHD  Past Medical History:  Past Medical History:  Diagnosis Date   ADHD (attention deficit hyperactivity disorder)    Asthma     Past Surgical History:  Procedure Laterality Date   CHOLECYSTECTOMY     COLON SURGERY     Family History:  Family History  Problem Relation Age of Onset   Diabetes Mother    Alzheimer's disease Mother    Depression Sister    Hypertension Father    Family Psychiatric  History: See previous Social History:  Social History   Substance and Sexual Activity  Alcohol Use Yes   Comment: special - occ     Social History   Substance and Sexual Activity  Drug Use No    Social History   Socioeconomic History   Marital status: Married    Spouse name: Not on file   Number of children: Not on file   Years of education: Not on file   Highest education level: Not on file  Occupational History   Not on file  Tobacco Use   Smoking status: Former    Packs/day: 0.50    Types: Cigarettes   Smokeless tobacco: Never   Tobacco comments:    "I quit months ago"  Vaping Use   Vaping Use: Never used  Substance and Sexual Activity   Alcohol use: Yes    Comment: special - occ   Drug use: No   Sexual activity:  Never  Other Topics Concern   Not on file  Social History Narrative   Not on file   Social Determinants of Health   Financial Resource Strain: Low Risk  (12/06/2017)   Overall Financial Resource Strain (CARDIA)    Difficulty of Paying Living Expenses: Not hard at all  Food Insecurity: No Food Insecurity (06/30/2022)   Hunger Vital Sign    Worried About Running Out of Food in the Last Year: Never true    Ran Out of Food in the Last Year: Never true  Transportation Needs: No Transportation Needs (06/30/2022)   PRAPARE - Administrator, Civil ServiceTransportation    Lack of Transportation (Medical): No    Lack of Transportation (Non-Medical): No  Physical Activity: Not on file  Stress: Not on file  Social Connections: Not on file   Additional Social History:                         Sleep: Fair  Appetite:  Fair  Current Medications: Current Facility-Administered Medications  Medication Dose Route Frequency Provider Last Rate Last Admin   acetaminophen (TYLENOL) tablet 650 mg  650 mg Oral Q6H PRN Nikole Swartzentruber T, MD       alum & mag hydroxide-simeth (MAALOX/MYLANTA) 200-200-20 MG/5ML suspension 30 mL  30 mL Oral Q4H  PRN Jeffren Dombek, Jackquline Denmark, MD       amphetamine-dextroamphetamine (ADDERALL XR) 24 hr capsule 20 mg  20 mg Oral Daily Andrey Hoobler, Jackquline Denmark, MD   20 mg at 07/01/22 0847   clonazePAM (KLONOPIN) tablet 0.5 mg  0.5 mg Oral BID Tayo Maute T, MD   0.5 mg at 07/01/22 0805   [START ON 07/02/2022] escitalopram (LEXAPRO) tablet 15 mg  15 mg Oral Daily Dariela Stoker, Jackquline Denmark, MD       hydrOXYzine (ATARAX) tablet 50 mg  50 mg Oral TID PRN Makailah Slavick, Jackquline Denmark, MD       magnesium hydroxide (MILK OF MAGNESIA) suspension 30 mL  30 mL Oral Daily PRN Arval Brandstetter T, MD       nicotine (NICODERM CQ - dosed in mg/24 hours) patch 21 mg  21 mg Transdermal Daily Dicky Boer T, MD   21 mg at 07/01/22 0805   QUEtiapine (SEROQUEL) tablet 100 mg  100 mg Oral QHS Jerrika Ledlow T, MD   100 mg at 06/30/22 1828   traZODone (DESYREL)  tablet 100 mg  100 mg Oral QHS PRN Solomon Skowronek, Jackquline Denmark, MD        Lab Results:  Results for orders placed or performed during the hospital encounter of 06/30/22 (from the past 48 hour(s))  Resp Panel by RT-PCR (Flu A&B, Covid) Anterior Nasal Swab     Status: None   Collection Time: 06/30/22  9:21 AM   Specimen: Anterior Nasal Swab  Result Value Ref Range   SARS Coronavirus 2 by RT PCR NEGATIVE NEGATIVE    Comment: (NOTE) SARS-CoV-2 target nucleic acids are NOT DETECTED.  The SARS-CoV-2 RNA is generally detectable in upper respiratory specimens during the acute phase of infection. The lowest concentration of SARS-CoV-2 viral copies this assay can detect is 138 copies/mL. A negative result does not preclude SARS-Cov-2 infection and should not be used as the sole basis for treatment or other patient management decisions. A negative result may occur with  improper specimen collection/handling, submission of specimen other than nasopharyngeal swab, presence of viral mutation(s) within the areas targeted by this assay, and inadequate number of viral copies(<138 copies/mL). A negative result must be combined with clinical observations, patient history, and epidemiological information. The expected result is Negative.  Fact Sheet for Patients:  BloggerCourse.com  Fact Sheet for Healthcare Providers:  SeriousBroker.it  This test is no t yet approved or cleared by the [place] FDA and  has been authorized for detection and/or diagnosis of SARS-CoV-2 by FDA under an Emergency Use Authorization (EUA). This EUA will remain  in effect (meaning this test can be used) for the duration of the COVID-19 declaration under Section 564(b)(1) of the Act, 21 U.S.C.section 360bbb-3(b)(1), unless the authorization is terminated  or revoked sooner.       Influenza A by PCR NEGATIVE NEGATIVE   Influenza B by PCR NEGATIVE NEGATIVE    Comment:  (NOTE) The Xpert Xpress SARS-CoV-2/FLU/RSV plus assay is intended as an aid in the diagnosis of influenza from Nasopharyngeal swab specimens and should not be used as a sole basis for treatment. Nasal washings and aspirates are unacceptable for Xpert Xpress SARS-CoV-2/FLU/RSV testing.  Fact Sheet for Patients: BloggerCourse.com  Fact Sheet for Healthcare Providers: SeriousBroker.it  This test is not yet approved or cleared by the [place] FDA and has been authorized for detection and/or diagnosis of SARS-CoV-2 by FDA under an Emergency Use Authorization (EUA). This EUA will remain in effect (meaning this test can  be used) for the duration of the COVID-19 declaration under Section 564(b)(1) of the Act, 21 U.S.C. section 360bbb-3(b)(1), unless the authorization is terminated or revoked.  Performed at [facility], [address] [place], [place] [address]   Comprehensive metabolic panel     Status: Abnormal   Collection Time: 06/30/22  9:21 AM  Result Value Ref Range   Sodium 142 135 - 145 mmol/L   Potassium 3.6 3.5 - 5.1 mmol/L   Chloride 113 (H) 98 - 111 mmol/L   CO2 25 22 - 32 mmol/L   Glucose, Bld 87 70 - 99 mg/dL    Comment: Glucose reference range applies only to samples taken after fasting for at least 8 hours.   BUN 18 6 - 20 mg/dL   Creatinine, Ser 6.21 0.61 - 1.24 mg/dL   Calcium 8.6 (L) 8.9 - 10.3 mg/dL   Total Protein 5.4 (L) 6.5 - 8.1 g/dL   Albumin 3.4 (L) 3.5 - 5.0 g/dL   AST 18 15 - 41 U/L   ALT 15 0 - 44 U/L   Alkaline Phosphatase 51 38 - 126 U/L   Total Bilirubin 0.2 (L) 0.3 - 1.2 mg/dL   GFR, Estimated >30 >86 mL/min    Comment: (NOTE) Calculated using the CKD-EPI Creatinine Equation (2021)    Anion gap 4 (L) 5 - 15    Comment: Performed at [facility], [address] [place], [place] [address]  Ethanol     Status: None   Collection Time: 06/30/22  9:21 AM  Result Value Ref  Range   Alcohol, Ethyl (B) <10 <10 mg/dL    Comment: (NOTE) Lowest detectable limit for serum alcohol is 10 mg/dL.  For medical purposes only. Performed at [facility], [address] [place], [place] [address]   Salicylate level     Status: Abnormal   Collection Time: 06/30/22  9:21 AM  Result Value Ref Range   Salicylate Lvl <7.0 (L) 7.0 - 30.0 mg/dL    Comment: Performed at [facility], [address] [place], [place] [address]  Acetaminophen level     Status: Abnormal   Collection Time: 06/30/22  9:21 AM  Result Value Ref Range   Acetaminophen (Tylenol), Serum <10 (L) 10 - 30 ug/mL    Comment: (NOTE) Therapeutic concentrations vary significantly. A range of 10-30 ug/mL  may be an effective concentration for many patients. However, some  are best treated at concentrations outside of this range. Acetaminophen concentrations >150 ug/mL at 4 hours after ingestion  and >50 ug/mL at 12 hours after ingestion are often associated with  toxic reactions.  Performed at [facility], [address] [place], [place] [address]   cbc     Status: Abnormal   Collection Time: 06/30/22  9:21 AM  Result Value Ref Range   WBC 6.2 4.0 - 10.5 K/uL   RBC 4.14 (L) 4.22 - 5.81 MIL/uL   Hemoglobin 13.1 13.0 - 17.0 g/dL   HCT 02.7 25.3 - 66.4 %   MCV 97.1 80.0 - 100.0 fL   MCH 31.6 26.0 - 34.0 pg   MCHC 32.6 30.0 - 36.0 g/dL   RDW 40.3 47.4 - 25.9 %   Platelets 282 150 - 400 K/uL   nRBC 0.0 0.0 - 0.2 %    Comment: Performed at [facility], [address] [place], [place] [address]  Urine Drug Screen, Qualitative     Status: Abnormal   Collection Time: 06/30/22  9:47 AM  Result Value Ref Range   Tricyclic, Ur Screen NONE DETECTED NONE DETECTED   Amphetamines, Ur Screen NONE DETECTED NONE DETECTED   MDMA (Ecstasy)Ur Screen NONE DETECTED NONE DETECTED   Cocaine Metabolite,Ur [place] NONE DETECTED NONE DETECTED   Opiate, Ur Screen NONE DETECTED NONE  DETECTED   Phencyclidine (PCP) Ur S NONE DETECTED NONE DETECTED   Cannabinoid 50 Ng, Ur [place] POSITIVE (A) NONE DETECTED   Barbiturates, Ur Screen NONE DETECTED NONE DETECTED   Benzodiazepine, Ur Scrn NONE DETECTED NONE DETECTED   Methadone Scn, Ur NONE DETECTED NONE DETECTED    Comment: (NOTE) Tricyclics + metabolites, urine    Cutoff 1000 ng/mL Amphetamines + metabolites, urine  Cutoff 1000 ng/mL MDMA (Ecstasy), urine              Cutoff 500 ng/mL Cocaine Metabolite, urine          Cutoff 300 ng/mL Opiate + metabolites, urine        Cutoff 300 ng/mL Phencyclidine (PCP), urine         Cutoff 25 ng/mL Cannabinoid, urine                 Cutoff 50 ng/mL Barbiturates + metabolites, urine  Cutoff 200 ng/mL Benzodiazepine, urine              Cutoff 200 ng/mL Methadone, urine                   Cutoff 300 ng/mL  The urine drug screen provides only a preliminary, unconfirmed analytical test result and should not be used for non-medical purposes. Clinical consideration and professional judgment should be applied to any positive drug screen result due to possible interfering substances. A more specific alternate chemical method must be used in order to obtain a confirmed analytical result. Gas chromatography / mass spectrometry (GC/MS) is the preferred confirm atory method. Performed at [facility], [address] [place], [place] [address]     Blood Alcohol level:  Lab Results  Component Value Date   [facility] <10 06/30/2022   ETH <10 05/08/2021    Metabolic Disorder Labs: No results found for: "HGBA1C", "MPG" No results found for: "PROLACTIN" No results found for: "CHOL", "TRIG", "HDL", "CHOLHDL", "VLDL", "LDLCALC"  Physical Findings: AIMS:  , ,  ,  ,    CIWA:    COWS:     Musculoskeletal: Strength & Muscle Tone: within normal limits Gait & Station: normal Patient leans: N/A  Psychiatric Specialty Exam:  Presentation  General Appearance: No data recorded Eye  Contact:No data recorded Speech:No data recorded Speech Volume:No data recorded Handedness:No data recorded  Mood and Affect  Mood:No data recorded Affect:No data recorded  Thought Process  Thought Processes:No data recorded Descriptions of Associations:No data recorded Orientation:No data recorded Thought Content:No data recorded History of Schizophrenia/Schizoaffective disorder:No data recorded Duration of Psychotic Symptoms:No data recorded Hallucinations:No data recorded Ideas of Reference:No data recorded Suicidal Thoughts:No data recorded Homicidal Thoughts:No data recorded  Sensorium  Memory:No data recorded Judgment:No data recorded Insight:No data recorded  Executive Functions  Concentration:No data recorded Attention Span:No data recorded Recall:No data recorded Fund of Knowledge:No data recorded Language:No data recorded  Psychomotor Activity  Psychomotor Activity:No data recorded  Assets  Assets:No data recorded  Sleep  Sleep:No data recorded   Physical Exam: Physical Exam Vitals reviewed.  Constitutional:      Appearance: Normal appearance.  HENT:     Head: Normocephalic and atraumatic.     Mouth/Throat:  Pharynx: Oropharynx is clear.  Eyes:     Pupils: Pupils are equal, round, and reactive to light.  Cardiovascular:     Rate and Rhythm: Normal rate and regular rhythm.  Pulmonary:     Effort: Pulmonary effort is normal.     Breath sounds: Normal breath sounds.  Abdominal:     General: Abdomen is flat.     Palpations: Abdomen is soft.  Musculoskeletal:        General: Normal range of motion.  Skin:    General: Skin is warm and dry.  Neurological:     General: No focal deficit present.     Mental Status: He is alert. Mental status is at baseline.  Psychiatric:        Attention and Perception: Attention normal. He is attentive.        Mood and Affect: Mood is anxious and depressed.        Speech: Speech normal.        Behavior:  Behavior is cooperative.        Thought Content: Thought content normal.    Review of Systems  Constitutional: Negative.   HENT: Negative.    Eyes: Negative.   Respiratory: Negative.    Cardiovascular: Negative.   Gastrointestinal: Negative.   Musculoskeletal: Negative.   Skin: Negative.   Neurological: Negative.   Psychiatric/Behavioral:  Positive for depression. The patient is nervous/anxious.    Blood pressure 122/81, pulse 72, temperature 97.8 F (36.6 C), temperature source Oral, resp. rate 18, height 5\' 11"  (1.803 m), weight 54.9 kg, SpO2 99 %. Body mass index is 16.88 kg/m.   Treatment Plan Summary: Plan lots of time doing supportive therapy.  Reviewed plan for short-term and longer term treatment.  Suggest increasing Lexapro to 15 mg a day to which she agrees.  No other medicine change.  Encourage group attendance.  He spoke with social work today.  Alethia Berthold, MD 07/01/2022, 1:46 PM

## 2022-07-01 NOTE — Progress Notes (Signed)
Pt is restless and anxious. Denies SI/HI. Compliant to medications. Denies pain. Patient has taken his night medication. Patient went to his bed after took his night medication.No other issues. Patient is now sleeping on his bed with eyes closed. Q15 checks are maintained.
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
## 2022-07-02 DIAGNOSIS — F332 Major depressive disorder, recurrent severe without psychotic features: Secondary | ICD-10-CM | POA: Diagnosis not present

## 2022-07-02 NOTE — [facility]HH Group Notes (Signed)
[place] Group Notes:  (Nursing/MHT/Case Management/Adjunct)  Date:  07/02/2022  Time:  8:40 PM  Type of Therapy:   Wrap up  Participation Level:  Active  Participation Quality:  Sharing  Affect:  Appropriate  Cognitive:  Alert  Insight:  Good  Engagement in Group:  said he was a little hard of hearing and that he was bipolar and he talk to his sister about a plan  Modes of Intervention:  Support  Summary of Progress/Problems:  Vincent [name] 07/02/2022, 8:40 PM

## 2022-07-02 NOTE — Progress Notes (Signed)
[facility] MD Progress Note  07/02/2022 11:57 AM Vincent [name]  MRN:  [id number] Subjective: Patient seen for follow-up.  56 year old man with depression and ADHD.  Patient reports that he is feeling a little down today but got some rest last night and denies suicidal ideation.  Talks again today about the frustrations he is having with job finding.  Does not appear to be psychotic.  Not agitated no behavior problems. Principal Problem: Severe recurrent major depression without psychotic features ([place]) Diagnosis: Principal Problem:   Severe recurrent major depression without psychotic features ([place])  Total Time spent with patient: 30 minutes  Past Psychiatric History: Past history of recurrent depression on top of chronic ADHD  Past Medical History:  Past Medical History:  Diagnosis Date   ADHD (attention deficit hyperactivity disorder)    Asthma     Past Surgical History:  Procedure Laterality Date   CHOLECYSTECTOMY     COLON SURGERY     Family History:  Family History  Problem Relation Age of Onset   Diabetes Mother    Alzheimer's disease Mother    Depression Sister    Hypertension Father    Family Psychiatric  History: See previous Social History:  Social History   Substance and Sexual Activity  Alcohol Use Yes   Comment: special - occ     Social History   Substance and Sexual Activity  Drug Use No    Social History   Socioeconomic History   Marital status: Married    Spouse name: Not on file   Number of children: Not on file   Years of education: Not on file   Highest education level: Not on file  Occupational History   Not on file  Tobacco Use   Smoking status: Former    Packs/day: 0.50    Types: Cigarettes   Smokeless tobacco: Never   Tobacco comments:    "I quit months ago"  Vaping Use   Vaping Use: Never used  Substance and Sexual Activity   Alcohol use: Yes    Comment: special - occ   Drug use: No   Sexual activity: Never  Other Topics Concern    Not on file  Social History Narrative   Not on file   Social Determinants of Health   Financial Resource Strain: Low Risk  (12/06/2017)   Overall Financial Resource Strain (CARDIA)    Difficulty of Paying Living Expenses: Not hard at all  Food Insecurity: No Food Insecurity (06/30/2022)   Hunger Vital Sign    Worried About Running Out of Food in the Last Year: Never true    Ran Out of Food in the Last Year: Never true  Transportation Needs: No Transportation Needs (06/30/2022)   PRAPARE - Hydrologist (Medical): No    Lack of Transportation (Non-Medical): No  Physical Activity: Not on file  Stress: Not on file  Social Connections: Not on file   Additional Social History:                         Sleep: Fair  Appetite:  Fair  Current Medications: Current Facility-Administered Medications  Medication Dose Route Frequency Provider Last Rate Last Admin   acetaminophen (TYLENOL) tablet 650 mg  650 mg Oral Q6H PRN Keyon Liller T, MD       alum & mag hydroxide-simeth (MAALOX/MYLANTA) 200-200-20 MG/5ML suspension 30 mL  30 mL Oral Q4H PRN Holy Battenfield, Madie Reno, MD  amphetamine-dextroamphetamine (ADDERALL XR) 24 hr capsule 20 mg  20 mg Oral Daily Larayne Baxley T, MD   20 mg at 07/02/22 0998   clonazePAM (KLONOPIN) tablet 0.5 mg  0.5 mg Oral BID Ryoma Nofziger T, MD   0.5 mg at 07/02/22 0809   escitalopram (LEXAPRO) tablet 15 mg  15 mg Oral Daily Dameka Younker, Jackquline Denmark, MD   15 mg at 07/02/22 0809   hydrOXYzine (ATARAX) tablet 50 mg  50 mg Oral TID PRN Linea Calles, Jackquline Denmark, MD       magnesium hydroxide (MILK OF MAGNESIA) suspension 30 mL  30 mL Oral Daily PRN Tatyana Biber T, MD       nicotine (NICODERM CQ - dosed in mg/24 hours) patch 21 mg  21 mg Transdermal Daily Garreth Burnsworth T, MD   21 mg at 07/02/22 0809   QUEtiapine (SEROQUEL) tablet 100 mg  100 mg Oral QHS Masaru Chamberlin T, MD   100 mg at 07/01/22 2106   traZODone (DESYREL) tablet 100 mg  100 mg Oral QHS PRN  Yannely Kintzel, Jackquline Denmark, MD        Lab Results: No results found for this or any previous visit (from the past 48 hour(s)).  Blood Alcohol level:  Lab Results  Component Value Date   ETH <10 06/30/2022   ETH <10 05/08/2021    Metabolic Disorder Labs: No results found for: "HGBA1C", "MPG" No results found for: "PROLACTIN" No results found for: "CHOL", "TRIG", "HDL", "CHOLHDL", "VLDL", "LDLCALC"  Physical Findings: AIMS:  , ,  ,  ,    CIWA:    COWS:     Musculoskeletal: Strength & Muscle Tone: within normal limits Gait & Station: normal Patient leans: N/A  Psychiatric Specialty Exam:  Presentation  General Appearance: No data recorded Eye Contact:No data recorded Speech:No data recorded Speech Volume:No data recorded Handedness:No data recorded  Mood and Affect  Mood:No data recorded Affect:No data recorded  Thought Process  Thought Processes:No data recorded Descriptions of Associations:No data recorded Orientation:No data recorded Thought Content:No data recorded History of Schizophrenia/Schizoaffective disorder:No data recorded Duration of Psychotic Symptoms:No data recorded Hallucinations:No data recorded Ideas of Reference:No data recorded Suicidal Thoughts:No data recorded Homicidal Thoughts:No data recorded  Sensorium  Memory:No data recorded Judgment:No data recorded Insight:No data recorded  Executive Functions  Concentration:No data recorded Attention Span:No data recorded Recall:No data recorded Fund of Knowledge:No data recorded Language:No data recorded  Psychomotor Activity  Psychomotor Activity:No data recorded  Assets  Assets:No data recorded  Sleep  Sleep:No data recorded   Physical Exam: Physical Exam Vitals reviewed.  Constitutional:      Appearance: Normal appearance.  HENT:     Head: Normocephalic and atraumatic.     Mouth/Throat:     Pharynx: Oropharynx is clear.  Eyes:     Pupils: Pupils are equal, round, and reactive to  light.  Cardiovascular:     Rate and Rhythm: Normal rate and regular rhythm.  Pulmonary:     Effort: Pulmonary effort is normal.     Breath sounds: Normal breath sounds.  Abdominal:     General: Abdomen is flat.     Palpations: Abdomen is soft.  Musculoskeletal:        General: Normal range of motion.  Skin:    General: Skin is warm and dry.  Neurological:     General: No focal deficit present.     Mental Status: He is alert. Mental status is at baseline.  Psychiatric:  Attention and Perception: Attention normal.        Mood and Affect: Mood normal. Affect is tearful.        Speech: Speech normal.        Behavior: Behavior is cooperative.        Thought Content: Thought content normal.        Cognition and Memory: Cognition normal.    Review of Systems  Constitutional: Negative.   HENT: Negative.    Eyes: Negative.   Respiratory: Negative.    Cardiovascular: Negative.   Gastrointestinal: Negative.   Musculoskeletal: Negative.   Skin: Negative.   Neurological: Negative.   Psychiatric/Behavioral:  Positive for depression. Negative for suicidal ideas. The patient is nervous/anxious.    Blood pressure 108/78, pulse 73, temperature 97.7 F (36.5 C), temperature source Oral, resp. rate 18, height 5\' 11"  (1.803 m), weight 54.9 kg, SpO2 99 %. Body mass index is 16.88 kg/m.   Treatment Plan Summary: Plan no change to medicine.  I told the patient at his request I will call his sister today and go over a possible plan for discharge tomorrow.  Reassess tomorrow.  , MD 07/02/2022, 11:57 AM

## 2022-07-02 NOTE — Plan of Care (Signed)
Pt is calm and cooperative.  In AM, depressed mood, denies SI, HI, AVH.  Pt says "I really feel bad today."  Pt reports that after he lost his job he eventually started staggering his meds by taking adderal for a while and then lexapro for a while.  This Probation officer provided education about SSRI withdrawal and the amount of time for effects to be felt.  Pt verbalized understanding.  Pt is compliant with medications and denies pain.    At 4 PM pt displayed better eye contact, was laughing and communicating with broad affect.  He was in the dayroom for several hours watching football and interacting with peers.  Continue to monitor via q 15 minute checks.

## 2022-07-03 DIAGNOSIS — F332 Major depressive disorder, recurrent severe without psychotic features: Secondary | ICD-10-CM | POA: Diagnosis not present

## 2022-07-03 NOTE — BH IP Treatment Plan (Signed)
Interdisciplinary Treatment and Diagnostic Plan Update  07/03/2022 Time of Session: 09:26 Vincent [name] MRN: [id number]  Principal Diagnosis: Severe recurrent major depression without psychotic features [facility])  Secondary Diagnoses: Principal Problem:   Severe recurrent major depression without psychotic features ([place])   Current Medications:  Current Facility-Administered Medications  Medication Dose Route Frequency Provider Last Rate Last Admin   acetaminophen (TYLENOL) tablet 650 mg  650 mg Oral Q6H PRN Clapacs, Madie Reno, MD   650 mg at 07/02/22 2138   alum & mag hydroxide-simeth (MAALOX/MYLANTA) 200-200-20 MG/5ML suspension 30 mL  30 mL Oral Q4H PRN Clapacs, Madie Reno, MD       amphetamine-dextroamphetamine (ADDERALL XR) 24 hr capsule 20 mg  20 mg Oral Daily Clapacs, John T, MD   20 mg at 07/03/22 6269   clonazePAM (KLONOPIN) tablet 0.5 mg  0.5 mg Oral BID Clapacs, John T, MD   0.5 mg at 07/03/22 0758   escitalopram (LEXAPRO) tablet 15 mg  15 mg Oral Daily Clapacs, Madie Reno, MD   15 mg at 07/03/22 0758   hydrOXYzine (ATARAX) tablet 50 mg  50 mg Oral TID PRN Clapacs, Madie Reno, MD       magnesium hydroxide (MILK OF MAGNESIA) suspension 30 mL  30 mL Oral Daily PRN Clapacs, Madie Reno, MD       nicotine (NICODERM CQ - dosed in mg/24 hours) patch 21 mg  21 mg Transdermal Daily Clapacs, Madie Reno, MD   21 mg at 07/03/22 0758   QUEtiapine (SEROQUEL) tablet 100 mg  100 mg Oral QHS Clapacs, John T, MD   100 mg at 07/02/22 2136   traZODone (DESYREL) tablet 100 mg  100 mg Oral QHS PRN Clapacs, Madie Reno, MD   100 mg at 07/02/22 2136   PTA Medications: Medications Prior to Admission  Medication Sig Dispense Refill Last Dose   amphetamine-dextroamphetamine (ADDERALL) 20 MG tablet Take 1 tablet (20 mg total) by mouth 2 (two) times daily at 10 AM and 5 PM. 30 tablet 0 Past Month   clonazePAM (KLONOPIN) 0.5 MG tablet Take 1 tablet (0.5 mg total) by mouth every 8 (eight) hours as needed for anxiety. 30 tablet 0 06/30/2022    escitalopram (LEXAPRO) 10 MG tablet Take 1 tablet (10 mg total) by mouth daily. 90 tablet 1 06/30/2022   albuterol (VENTOLIN HFA) 108 (90 Base) MCG/ACT inhaler Inhale 2 puffs into the lungs every 4 (four) hours as needed for wheezing or shortness of breath. 18 g 0 prn    Patient Stressors: Financial difficulties    Patient Strengths: Psychologist, clinical for treatment/growth  Religious Affiliation  Supportive family/friends   Treatment Modalities: Medication Management, Group therapy, Case management,  1 to 1 session with clinician, Psychoeducation, Recreational therapy.   Physician Treatment Plan for Primary Diagnosis: Severe recurrent major depression without psychotic features ([name]) Long Term Goal(s):     Short Term Goals:    Medication Management: Evaluate patient's response, side effects, and tolerance of medication regimen.  Therapeutic Interventions: 1 to 1 sessions, Unit Group sessions and Medication administration.  Evaluation of Outcomes: Not Met  Physician Treatment Plan for Secondary Diagnosis: Principal Problem:   Severe recurrent major depression without psychotic features ([name])  Long Term Goal(s):     Short Term Goals:       Medication Management: Evaluate patient's response, side effects, and tolerance of medication regimen.  Therapeutic Interventions: 1 to 1 sessions, Unit Group sessions and Medication administration.  Evaluation of Outcomes:  Not Met   RN Treatment Plan for Primary Diagnosis: Severe recurrent major depression without psychotic features ([name]) Long Term Goal(s): Knowledge of disease and therapeutic regimen to maintain health will improve  Short Term Goals: Ability to remain free from injury will improve, Ability to verbalize frustration and anger appropriately will improve, Ability to demonstrate self-control, Ability to participate in decision making will improve, Ability to verbalize feelings will improve, Ability to  disclose and discuss suicidal ideas, Ability to identify and develop effective coping behaviors will improve, and Compliance with prescribed medications will improve  Medication Management: RN will administer medications as ordered by provider, will assess and evaluate patient's response and provide education to patient for prescribed medication. RN will report any adverse and/or side effects to prescribing provider.  Therapeutic Interventions: 1 on 1 counseling sessions, Psychoeducation, Medication administration, Evaluate responses to treatment, Monitor vital signs and CBGs as ordered, Perform/monitor CIWA, COWS, AIMS and Fall Risk screenings as ordered, Perform wound care treatments as ordered.  Evaluation of Outcomes: Not Met   LCSW Treatment Plan for Primary Diagnosis: Severe recurrent major depression without psychotic features ([name]) Long Term Goal(s): Safe transition to appropriate next level of care at discharge, Engage patient in therapeutic group addressing interpersonal concerns.  Short Term Goals: Engage patient in aftercare planning with referrals and resources, Increase social support, Increase ability to appropriately verbalize feelings, Increase emotional regulation, Facilitate acceptance of mental health diagnosis and concerns, Facilitate patient progression through stages of change regarding substance use diagnoses and concerns, Identify triggers associated with mental health/substance abuse issues, and Increase skills for wellness and recovery  Therapeutic Interventions: Assess for all discharge needs, 1 to 1 time with Social worker, Explore available resources and support systems, Assess for adequacy in community support network, Educate family and significant other(s) on suicide prevention, Complete Psychosocial Assessment, Interpersonal group therapy.  Evaluation of Outcomes: Not Met   Progress in Treatment: Attending groups: Yes. Participating in groups: Yes. Taking  medication as prescribed: Yes. Toleration medication: Yes. Family/Significant other contact made: No, will contact:  sister, Abby Potash. Patient understands diagnosis: Yes. Discussing patient identified problems/goals with staff: Yes. Medical problems stabilized or resolved: Yes. Denies suicidal/homicidal ideation: Yes. Issues/concerns per patient self-inventory: No. Other: none.  New problem(s) identified: No, Describe:  none identified.  New Short Term/Long Term Goal(s):  medication management for mood stabilization; elimination of SI thoughts; development of comprehensive mental wellness/sobriety plan.  Patient Goals:  "Well I needed to get my emotions stabilized."  Discharge Plan or Barriers: CSW will assist pt with development of an appropriate aftercare/discharge plan.   Reason for Continuation of Hospitalization: Anxiety Depression Medication stabilization  Estimated Length of Stay: 1-7 days  Last 3 [place] Suicide Severity Risk Score: Flowsheet Row Admission (Current) from 06/30/2022 in [place] Most recent reading at 07/01/2022 10:00 PM ED from 06/30/2022 in [place] Most recent reading at 06/30/2022  9:27 AM ED from 04/27/2022 in [place] Most recent reading at 04/26/2022 10:00 PM  C-SSRS RISK CATEGORY High Risk Low Risk No Risk       Last PHQ 2/9 Scores:     No data to display          Scribe for Treatment Team: Shirl Harris, LCSW 07/03/2022 10:28 AM

## 2022-07-03 NOTE — Progress Notes (Signed)
Recreation Therapy Notes  Date: 07/03/2022  Time: 10:50 am   Location: Craft room       Behavioral response: N/A   Intervention Topic: Time Management   Discussion/Intervention: Patient refused to attend group.   Clinical Observations/Feedback:  Patient refused to attend group.    Miquel Stacks LRT/CTRS        Vincent [name] 07/03/2022 11:49 AM

## 2022-07-03 NOTE — BHH Suicide Risk Assessment (Signed)
[place] INPATIENT:  Family/Significant Other Suicide Prevention Education  Suicide Prevention Education:  Education Completed; Vincent Vincent [name]/sister [phone]), has been identified by the patient as the family member/significant other with whom the patient will be residing, and identified as the person(s) who will aid the patient in the event of a mental health crisis (suicidal ideations/suicide attempt).  With written consent from the patient, the family member/significant other has been provided the following suicide prevention education, prior to the and/or following the discharge of the patient.  The suicide prevention education provided includes the following: Suicide risk factors Suicide prevention and interventions National Suicide Hotline telephone number [facility] assessment telephone number [facility] Emergency Assistance [place] and/or Residential Mobile Crisis Unit telephone number  Request made of family/significant other to: Remove weapons (e.g., guns, rifles, knives), all items previously/currently identified as safety concern.   Remove drugs/medications (over-the-counter, prescriptions, illicit drugs), all items previously/currently identified as a safety concern.  The family member/significant other verbalizes understanding of the suicide prevention education information provided.  The family member/significant other agrees to remove the items of safety concern listed above.  Vincent [name] shared that she knew her brother needed help because he became emotionally labile. She stated that pt would be crying or angry/aggressive without warning or trigger. She shared that pt has not be medication compliant due to losing his job. When asked whether he is a danger to himself or anyone else, sister shared that she has spoken with pt and he seems back to his baseline. Vincent [name] stated that he is always safe when he is actually taking his medication like he as prescribed.  She denied any access to weapons, stating that the guns in the home are locked in a safe.   Vincent Vincent [name] 07/03/2022, 3:56 PM

## 2022-07-03 NOTE — Progress Notes (Signed)
Pt is restless, anxious,  and cooperative. Alerted and oriented X4. Denies SI/HI. Compliant to medications. Vital signs are stable. Denies pain. Patient was in the day room before and after medications. Patient has gone to bed after night medications. Patient has slept on bed most of the shift with eyes closed. No other issues. Q 15 minutes checks are maintained.

## 2022-07-03 NOTE — Group Note (Signed)
[facility] LCSW Group Therapy Note    Group Date: 07/03/2022 Start Time: 1300 End Time: 1400  Type of Therapy and Topic:  Group Therapy:  Overcoming Obstacles  Participation Level:  [facility] PARTICIPATION LEVEL: Active   Description of Group:   In this group patients will be encouraged to explore what they see as obstacles to their own wellness and recovery. They will be guided to discuss their thoughts, feelings, and behaviors related to these obstacles. The group will process together ways to cope with barriers, with attention given to specific choices patients can make. Each patient will be challenged to identify changes they are motivated to make in order to overcome their obstacles. This group will be process-oriented, with patients participating in exploration of their own experiences as well as giving and receiving support and challenge from other group members.  Therapeutic Goals: 1. Patient will identify personal and current obstacles as they relate to admission. 2. Patient will identify barriers that currently interfere with their wellness or overcoming obstacles.  3. Patient will identify feelings, thought process and behaviors related to these barriers. 4. Patient will identify two changes they are willing to make to overcome these obstacles:    Summary of Patient Progress Patient was present for the entirety of the group process. He identified loss of employment as an obstacle that he needs to overcome. Pt shared that this has caused all kinds of issues but the main one being that he is unable to afford his medication. He asked to speak with CSW regarding medication assistance and applying for medicaid. He was informed that CSW would speak with his about this after group. Pt agreed. Pt was actively involved in the discussion and presented with some insight into the topic. He appeared open and receptive to feedback/comments from both peers and  facilitator.    Therapeutic Modalities:   Cognitive Behavioral Therapy Solution Focused Therapy Motivational Interviewing Relapse Prevention Therapy   Shirl Harris, LCSW

## 2022-07-03 NOTE — Progress Notes (Signed)
Patient was cooperative with treatment, he was visible in the milieu, he denies SI & HI on shift.  He seemed to sleep well through out the night.

## 2022-07-03 NOTE — Plan of Care (Signed)
D- Patient alert and oriented. Patient endorsing anxiety of a 3 on a scale of 0-10 and depression during am assessment. Patient mood is pleasant. Patient smiling and laughing while speaking with Probation officer during med administration. Patient denies SI, HI, AVH, Patient states goal is to better regulate his emotions and understand his medication/ discharge care plans so that he can better function daily. Patient participated in treatment team and expressed willingness to try outpatient therapy.   A- Scheduled medications administered to patient, per MD orders. Support and encouragement provided.  Routine safety checks conducted every 15 minutes.  Patient informed to notify staff with problems or concerns.  R- No adverse drug reactions noted. Patient contracts for safety at this time. Patient compliant with medications and treatment plan. Patient receptive, calm, and cooperative. Patient interacts well with others on the unit.  Patient remains safe at this time.   Problem: Health Behavior/Discharge Planning: Goal: Ability to manage health-related needs will improve Outcome: Progressing   Problem: Activity: Goal: Risk for activity intolerance will decrease Outcome: Progressing   Problem: Nutrition: Goal: Adequate nutrition will be maintained Outcome: Progressing   Problem: Coping: Goal: Level of anxiety will decrease Outcome: Progressing   Problem: Pain Managment: Goal: General experience of comfort will improve Outcome: Progressing

## 2022-07-03 NOTE — Progress Notes (Signed)
[facility] MD Progress Note  07/03/2022 5:19 PM Vincent [name]  MRN:  [id number] Subjective: Follow-up patient with depression.  Anxious and depressed today but came to treatment team interacted appropriately.  No current suicidal thoughts Principal Problem: Severe recurrent major depression without psychotic features ([place]) Diagnosis: Principal Problem:   Severe recurrent major depression without psychotic features ([place])  Total Time spent with patient: 30 minutes  Past Psychiatric History: Past history of recurrent depression and ADHD  Past Medical History:  Past Medical History:  Diagnosis Date   ADHD (attention deficit hyperactivity disorder)    Asthma     Past Surgical History:  Procedure Laterality Date   CHOLECYSTECTOMY     COLON SURGERY     Family History:  Family History  Problem Relation Age of Onset   Diabetes Mother    Alzheimer's disease Mother    Depression Sister    Hypertension Father    Family Psychiatric  History: See previous Social History:  Social History   Substance and Sexual Activity  Alcohol Use Yes   Comment: special - occ     Social History   Substance and Sexual Activity  Drug Use No    Social History   Socioeconomic History   Marital status: Married    Spouse name: Not on file   Number of children: Not on file   Years of education: Not on file   Highest education level: Not on file  Occupational History   Not on file  Tobacco Use   Smoking status: Former    Packs/day: 0.50    Types: Cigarettes   Smokeless tobacco: Never   Tobacco comments:    "I quit months ago"  Vaping Use   Vaping Use: Never used  Substance and Sexual Activity   Alcohol use: Yes    Comment: special - occ   Drug use: No   Sexual activity: Never  Other Topics Concern   Not on file  Social History Narrative   Not on file   Social Determinants of Health   Financial Resource Strain: Low Risk  (12/06/2017)   Overall Financial Resource Strain (CARDIA)     Difficulty of Paying Living Expenses: Not hard at all  Food Insecurity: No Food Insecurity (06/30/2022)   Hunger Vital Sign    Worried About Running Out of Food in the Last Year: Never true    Ran Out of Food in the Last Year: Never true  Transportation Needs: No Transportation Needs (06/30/2022)   PRAPARE - Hydrologist (Medical): No    Lack of Transportation (Non-Medical): No  Physical Activity: Not on file  Stress: Not on file  Social Connections: Not on file   Additional Social History:                         Sleep: Fair  Appetite:  Fair  Current Medications: Current Facility-Administered Medications  Medication Dose Route Frequency Provider Last Rate Last Admin   acetaminophen (TYLENOL) tablet 650 mg  650 mg Oral Q6H PRN Dior Dominik T, MD   650 mg at 07/02/22 2138   alum & mag hydroxide-simeth (MAALOX/MYLANTA) 200-200-20 MG/5ML suspension 30 mL  30 mL Oral Q4H PRN Tressa Maldonado T, MD       amphetamine-dextroamphetamine (ADDERALL XR) 24 hr capsule 20 mg  20 mg Oral Daily Deavion Dobbs T, MD   20 mg at 07/03/22 0829   clonazePAM (KLONOPIN) tablet 0.5 mg  0.5  mg Oral BID Kery Batzel T, MD   0.5 mg at 07/03/22 1621   escitalopram (LEXAPRO) tablet 15 mg  15 mg Oral Daily Brelynn Wheller T, MD   15 mg at 07/03/22 0758   hydrOXYzine (ATARAX) tablet 50 mg  50 mg Oral TID PRN Blake Goya, Jackquline Denmark, MD       magnesium hydroxide (MILK OF MAGNESIA) suspension 30 mL  30 mL Oral Daily PRN Frayda Egley T, MD       nicotine (NICODERM CQ - dosed in mg/24 hours) patch 21 mg  21 mg Transdermal Daily Rodriquez Thorner T, MD   21 mg at 07/03/22 0758   QUEtiapine (SEROQUEL) tablet 100 mg  100 mg Oral QHS Briseyda Fehr T, MD   100 mg at 07/02/22 2136   traZODone (DESYREL) tablet 100 mg  100 mg Oral QHS PRN Alpheus Stiff, Jackquline Denmark, MD   100 mg at 07/02/22 2136    Lab Results: No results found for this or any previous visit (from the past 48 hour(s)).  Blood Alcohol level:   Lab Results  Component Value Date   ETH <10 06/30/2022   ETH <10 05/08/2021    Metabolic Disorder Labs: No results found for: "HGBA1C", "MPG" No results found for: "PROLACTIN" No results found for: "CHOL", "TRIG", "HDL", "CHOLHDL", "VLDL", "LDLCALC"  Physical Findings: AIMS:  , ,  ,  ,    CIWA:    COWS:     Musculoskeletal: Strength & Muscle Tone: within normal limits Gait & Station: normal Patient leans: N/A  Psychiatric Specialty Exam:  Presentation  General Appearance: No data recorded Eye Contact:No data recorded Speech:No data recorded Speech Volume:No data recorded Handedness:No data recorded  Mood and Affect  Mood:No data recorded Affect:No data recorded  Thought Process  Thought Processes:No data recorded Descriptions of Associations:No data recorded Orientation:No data recorded Thought Content:No data recorded History of Schizophrenia/Schizoaffective disorder:No data recorded Duration of Psychotic Symptoms:No data recorded Hallucinations:No data recorded Ideas of Reference:No data recorded Suicidal Thoughts:No data recorded Homicidal Thoughts:No data recorded  Sensorium  Memory:No data recorded Judgment:No data recorded Insight:No data recorded  Executive Functions  Concentration:No data recorded Attention Span:No data recorded Recall:No data recorded Fund of Knowledge:No data recorded Language:No data recorded  Psychomotor Activity  Psychomotor Activity:No data recorded  Assets  Assets:No data recorded  Sleep  Sleep:No data recorded   Physical Exam: Physical Exam Vitals and nursing note reviewed.  Constitutional:      Appearance: Normal appearance.  HENT:     Head: Normocephalic and atraumatic.     Mouth/Throat:     Pharynx: Oropharynx is clear.  Eyes:     Pupils: Pupils are equal, round, and reactive to light.  Cardiovascular:     Rate and Rhythm: Normal rate and regular rhythm.  Pulmonary:     Effort: Pulmonary effort is  normal.     Breath sounds: Normal breath sounds.  Abdominal:     General: Abdomen is flat.     Palpations: Abdomen is soft.  Musculoskeletal:        General: Normal range of motion.  Skin:    General: Skin is warm and dry.  Neurological:     General: No focal deficit present.     Mental Status: He is alert. Mental status is at baseline.  Psychiatric:        Attention and Perception: Attention normal.        Mood and Affect: Mood is depressed.        Speech:  Speech is rapid and pressured.        Behavior: Behavior is cooperative.        Thought Content: Thought content normal.        Cognition and Memory: Cognition normal.    Review of Systems  Constitutional: Negative.   HENT: Negative.    Eyes: Negative.   Respiratory: Negative.    Cardiovascular: Negative.   Gastrointestinal: Negative.   Musculoskeletal: Negative.   Skin: Negative.   Neurological: Negative.   Psychiatric/Behavioral:  Positive for depression. Negative for hallucinations, substance abuse and suicidal ideas. The patient is nervous/anxious and has insomnia.    Blood pressure 108/82, pulse 70, temperature 98 F (36.7 C), temperature source Oral, resp. rate 18, height 5\' 11"  (1.803 m), weight 54.9 kg, SpO2 100 %. Body mass index is 16.88 kg/m.   Treatment Plan Summary: Medication management and Plan patient appreciated adding the Seroquel last night.  We will continue that dosage of medicine for now.  Also trazodone.  Lots of supportive counseling and therapy and review of outpatient treatment plan.  , MD 07/03/2022, 5:19 PM

## 2022-07-04 ENCOUNTER — Other Ambulatory Visit: Payer: Self-pay

## 2022-07-04 DIAGNOSIS — F332 Major depressive disorder, recurrent severe without psychotic features: Secondary | ICD-10-CM | POA: Diagnosis not present

## 2022-07-04 MED ORDER — AMPHETAMINE-DEXTROAMPHET ER 20 MG PO CP24: 40.0000 mg | ORAL_CAPSULE | Freq: Every day | ORAL | 0 refills | Status: DC

## 2022-07-04 MED ORDER — QUETIAPINE FUMARATE 100 MG PO TABS: 100.0000 mg | ORAL_TABLET | Freq: Every day | ORAL | 1 refills | Status: DC

## 2022-07-04 MED ORDER — ESCITALOPRAM OXALATE 10 MG PO TABS
15.0000 mg | ORAL_TABLET | Freq: Every day | ORAL | 0 refills | Status: DC
  Filled 2022-07-04: qty 45, 30d supply, fill #0

## 2022-07-04 MED ORDER — TRAZODONE HCL 100 MG PO TABS: 100.0000 mg | ORAL_TABLET | Freq: Every evening | ORAL | 1 refills | Status: DC | PRN

## 2022-07-04 MED ORDER — ESCITALOPRAM OXALATE 10 MG PO TABS: 15.0000 mg | ORAL_TABLET | Freq: Every day | ORAL | 1 refills | Status: DC

## 2022-07-04 MED ORDER — CLONAZEPAM 0.5 MG PO TABS: 0.5000 mg | ORAL_TABLET | Freq: Two times a day (BID) | ORAL | 0 refills | Status: DC

## 2022-07-04 MED ORDER — TRAZODONE HCL 100 MG PO TABS
100.0000 mg | ORAL_TABLET | Freq: Every evening | ORAL | 0 refills | Status: DC | PRN
  Filled 2022-07-04: qty 30, 30d supply, fill #0

## 2022-07-04 MED ORDER — NICOTINE 21 MG/24HR TD PT24
21.0000 mg | MEDICATED_PATCH | Freq: Every day | TRANSDERMAL | 0 refills | Status: AC
  Filled 2022-07-04: qty 28, 28d supply, fill #0

## 2022-07-04 MED ORDER — QUETIAPINE FUMARATE 100 MG PO TABS
100.0000 mg | ORAL_TABLET | Freq: Every day | ORAL | 0 refills | Status: DC
  Filled 2022-07-04: qty 30, 30d supply, fill #0

## 2022-07-04 MED ORDER — AMPHETAMINE-DEXTROAMPHET ER 5 MG PO CP24: 40.0000 mg | ORAL_CAPSULE | Freq: Every day | ORAL | Status: DC

## 2022-07-04 MED ORDER — NICOTINE 21 MG/24HR TD PT24: 21.0000 mg | MEDICATED_PATCH | Freq: Every day | TRANSDERMAL | 0 refills | Status: DC

## 2022-07-04 NOTE — BHH Suicide Risk Assessment (Signed)
[facility] Discharge Suicide Risk Assessment   Principal Problem: Severe recurrent major depression without psychotic features ([place]) Discharge Diagnoses: Principal Problem:   Severe recurrent major depression without psychotic features ([place])   Total Time spent with patient: 30 minutes  Musculoskeletal: Strength & Muscle Tone: within normal limits Gait & Station: normal Patient leans: N/A  Psychiatric Specialty Exam  Presentation  General Appearance: No data recorded Eye Contact:No data recorded Speech:No data recorded Speech Volume:No data recorded Handedness:No data recorded  Mood and Affect  Mood:No data recorded Duration of Depression Symptoms: No data recorded Affect:No data recorded  Thought Process  Thought Processes:No data recorded Descriptions of Associations:No data recorded Orientation:No data recorded Thought Content:No data recorded History of Schizophrenia/Schizoaffective disorder:No data recorded Duration of Psychotic Symptoms:No data recorded Hallucinations:No data recorded Ideas of Reference:No data recorded Suicidal Thoughts:No data recorded Homicidal Thoughts:No data recorded  Sensorium  Memory:No data recorded Judgment:No data recorded Insight:No data recorded  Executive Functions  Concentration:No data recorded Attention Span:No data recorded Recall:No data recorded Fund of Knowledge:No data recorded Language:No data recorded  Psychomotor Activity  Psychomotor Activity:No data recorded  Assets  Assets:No data recorded  Sleep  Sleep:No data recorded  Physical Exam: Physical Exam Vitals and nursing note reviewed.  Constitutional:      Appearance: Normal appearance.  HENT:     Head: Normocephalic and atraumatic.     Mouth/Throat:     Pharynx: Oropharynx is clear.  Eyes:     Pupils: Pupils are equal, round, and reactive to light.  Cardiovascular:     Rate and Rhythm: Normal rate and regular rhythm.  Pulmonary:     Effort: Pulmonary  effort is normal.     Breath sounds: Normal breath sounds.  Abdominal:     General: Abdomen is flat.     Palpations: Abdomen is soft.  Musculoskeletal:        General: Normal range of motion.  Skin:    General: Skin is warm and dry.  Neurological:     General: No focal deficit present.     Mental Status: He is alert. Mental status is at baseline.  Psychiatric:        Attention and Perception: Attention normal.        Mood and Affect: Mood is anxious.        Speech: Speech normal.        Behavior: Behavior normal.        Thought Content: Thought content normal.        Cognition and Memory: Cognition normal.    Review of Systems  Constitutional: Negative.   HENT: Negative.    Eyes: Negative.   Respiratory: Negative.    Cardiovascular: Negative.   Gastrointestinal: Negative.   Musculoskeletal: Negative.   Skin: Negative.   Neurological: Negative.   Psychiatric/Behavioral: Negative.     Blood pressure 110/65, pulse 73, temperature 97.7 F (36.5 C), temperature source Oral, resp. rate 18, height 5\' 11"  (1.803 m), weight 54.9 kg, SpO2 97 %. Body mass index is 16.88 kg/m.  Mental Status Per Nursing Assessment::   On Admission:  Suicidal ideation indicated by patient  Demographic Factors:  Male, Caucasian, and Unemployed  Loss Factors: Financial problems/change in socioeconomic status  Historical Factors: Impulsivity  Risk Reduction Factors:   Living with another person, especially a relative, Positive social support, Positive therapeutic relationship, and Positive coping skills or problem solving skills  Continued Clinical Symptoms:  Depression:   Impulsivity Insomnia  Cognitive Features That Contribute To Risk:  None  Suicide Risk:  Minimal: No identifiable suicidal ideation.  Patients presenting with no risk factors but with morbid ruminations; may be classified as minimal risk based on the severity of the depressive symptoms   Follow-up Information      [facility] Follow up.   Why: You have an appointment scheduled to meet Lanae Boast, peer support specialist on Friday, 07/07/22 at 7am. Thanks! Contact information: [place] [address] [phone]                 Plan Of Care/Follow-up recommendations:  Other:  Patient feels safe and ready for discharge.  He agrees to specific discharge plan with follow-up therapy and medicine management.  Patient agrees to plan to avoid any intoxicating substances for the time being.  Completely denies suicidal ideation.  Does not appear to be at elevated risk of self-harm at this time.  Alethia Berthold, MD 07/04/2022, 11:11 AM

## 2022-07-04 NOTE — Discharge Summary (Signed)
Physician Discharge Summary Note  Patient:  Vincent [name] is an 56 y.o., male MRN:  [id number] DOB:  06-Nov-1965 Patient phone:  [phone] (home)  Patient address:   [address] [place] [address],  Total Time spent with patient: 30 minutes  Date of Admission:  06/30/2022 Date of Discharge: 07/04/2022  Reason for Admission: Admitted after presentation to the emergency room with depressed mood and anxiety passive suicidal thoughts mood swings  Principal Problem: Severe recurrent major depression without psychotic features [facility]) Discharge Diagnoses: Principal Problem:   Severe recurrent major depression without psychotic features [facility])   Past Psychiatric History: Patient has a history of ADHD and depression with at least 1 previous episode of severe depression.  No previous hospitalizations no previous suicide attempts  Past Medical History:  Past Medical History:  Diagnosis Date   ADHD (attention deficit hyperactivity disorder)    Asthma     Past Surgical History:  Procedure Laterality Date   CHOLECYSTECTOMY     COLON SURGERY     Family History:  Family History  Problem Relation Age of Onset   Diabetes Mother    Alzheimer's disease Mother    Depression Sister    Hypertension Father    Family Psychiatric  History: Depression in the family no suicide attempts Social History:  Social History   Substance and Sexual Activity  Alcohol Use Yes   Comment: special - occ     Social History   Substance and Sexual Activity  Drug Use No    Social History   Socioeconomic History   Marital status: Married    Spouse name: Not on file   Number of children: Not on file   Years of education: Not on file   Highest education level: Not on file  Occupational History   Not on file  Tobacco Use   Smoking status: Former    Packs/day: 0.50    Types: Cigarettes   Smokeless tobacco: Never   Tobacco comments:    "I quit months ago"  Vaping Use   Vaping Use: Never used   Substance and Sexual Activity   Alcohol use: Yes    Comment: special - occ   Drug use: No   Sexual activity: Never  Other Topics Concern   Not on file  Social History Narrative   Not on file   Social Determinants of Health   Financial Resource Strain: Low Risk  (12/06/2017)   Overall Financial Resource Strain (CARDIA)    Difficulty of Paying Living Expenses: Not hard at all  Food Insecurity: No Food Insecurity (06/30/2022)   Hunger Vital Sign    Worried About Running Out of Food in the Last Year: Never true    Ran Out of Food in the Last Year: Never true  Transportation Needs: No Transportation Needs (06/30/2022)   PRAPARE - Administrator, Civil Service (Medical): No    Lack of Transportation (Non-Medical): No  Physical Activity: Not on file  Stress: Not on file  Social Connections: Not on file    Hospital Course: Admitted to psychiatric unit.  Patient was kept on 15-minute checks and engage in individual and group therapy and assessment.  He attended treatment team and participated appropriately in treatment planning.  Medications were adjusted including the Adderall and Lexapro.  Patient tolerated medicine well but was having trouble sleeping.  Seroquel has been added and the patient finds it extremely helpful and feels much more rested and clear headed afterwards.  Patient is  consistently denying suicidal intent or plan and now feels much more optimistic.  He is agreeable to follow-up with [facility] as well as medication follow-up  Physical Findings: AIMS:  , ,  ,  ,    CIWA:    COWS:     Musculoskeletal: Strength & Muscle Tone: within normal limits Gait & Station: normal Patient leans: N/A   Psychiatric Specialty Exam:  Presentation  General Appearance: No data recorded Eye Contact:No data recorded Speech:No data recorded Speech Volume:No data recorded Handedness:No data recorded  Mood and Affect  Mood:No data recorded Affect:No data  recorded  Thought Process  Thought Processes:No data recorded Descriptions of Associations:No data recorded Orientation:No data recorded Thought Content:No data recorded History of Schizophrenia/Schizoaffective disorder:No data recorded Duration of Psychotic Symptoms:No data recorded Hallucinations:No data recorded Ideas of Reference:No data recorded Suicidal Thoughts:No data recorded Homicidal Thoughts:No data recorded  Sensorium  Memory:No data recorded Judgment:No data recorded Insight:No data recorded  Executive Functions  Concentration:No data recorded Attention Span:No data recorded Recall:No data recorded Fund of Knowledge:No data recorded Language:No data recorded  Psychomotor Activity  Psychomotor Activity:No data recorded  Assets  Assets:No data recorded  Sleep  Sleep:No data recorded   Physical Exam: Physical Exam Vitals and nursing note reviewed.  Constitutional:      Appearance: Normal appearance.  HENT:     Head: Normocephalic and atraumatic.     Mouth/Throat:     Pharynx: Oropharynx is clear.  Eyes:     Pupils: Pupils are equal, round, and reactive to light.  Cardiovascular:     Rate and Rhythm: Normal rate and regular rhythm.  Pulmonary:     Effort: Pulmonary effort is normal.     Breath sounds: Normal breath sounds.  Abdominal:     General: Abdomen is flat.     Palpations: Abdomen is soft.  Musculoskeletal:        General: Normal range of motion.  Skin:    General: Skin is warm and dry.  Neurological:     General: No focal deficit present.     Mental Status: He is alert. Mental status is at baseline.  Psychiatric:        Mood and Affect: Mood normal.        Thought Content: Thought content normal.    Review of Systems  Constitutional: Negative.   HENT: Negative.    Eyes: Negative.   Respiratory: Negative.    Cardiovascular: Negative.   Gastrointestinal: Negative.   Musculoskeletal: Negative.   Skin: Negative.   Neurological:  Negative.   Psychiatric/Behavioral: Negative.     Blood pressure 110/65, pulse 73, temperature 97.7 F (36.5 C), temperature source Oral, resp. rate 18, height 5\' 11"  (1.803 m), weight 54.9 kg, SpO2 97 %. Body mass index is 16.88 kg/m.   Social History   Tobacco Use  Smoking Status Former   Packs/day: 0.50   Types: Cigarettes  Smokeless Tobacco Never  Tobacco Comments   "I quit months ago"   Tobacco Cessation:  A prescription for an FDA-approved tobacco cessation medication was offered at discharge and the patient refused   Blood Alcohol level:  Lab Results  Component Value Date   ETH <10 06/30/2022   ETH <10 05/08/2021    Metabolic Disorder Labs:  No results found for: "HGBA1C", "MPG" No results found for: "PROLACTIN" No results found for: "CHOL", "TRIG", "HDL", "CHOLHDL", "VLDL", "LDLCALC"  See Psychiatric Specialty Exam and Suicide Risk Assessment completed by Attending Physician prior to discharge.  Discharge destination:  Home  Is patient on multiple antipsychotic therapies at discharge:  No   Has Patient had three or more failed trials of antipsychotic monotherapy by history:  No  Recommended Plan for Multiple Antipsychotic Therapies: NA  Discharge Instructions     Diet - low sodium heart healthy   Complete by: As directed    Increase activity slowly   Complete by: As directed       Allergies as of 07/04/2022   No Known Allergies      Medication List     STOP taking these medications    albuterol 108 (90 Base) MCG/ACT inhaler Commonly known as: VENTOLIN HFA   amphetamine-dextroamphetamine 20 MG tablet Commonly known as: ADDERALL Replaced by: amphetamine-dextroamphetamine 20 MG 24 hr capsule       TAKE these medications      Indication  amphetamine-dextroamphetamine 20 MG 24 hr capsule Commonly known as: ADDERALL XR Take 2 capsules (40 mg total) by mouth daily. Start taking on: July 05, 2022 Replaces: amphetamine-dextroamphetamine  20 MG tablet  Indication: Attention Deficit Hyperactivity Disorder   clonazePAM 0.5 MG tablet Commonly known as: KLONOPIN Take 1 tablet (0.5 mg total) by mouth 2 (two) times daily. What changed:  when to take this reasons to take this  Indication: Feeling Anxious   escitalopram 10 MG tablet Commonly known as: LEXAPRO Take 1.5 tablets (15 mg total) by mouth daily. Start taking on: July 05, 2022 What changed: how much to take  Indication: Major Depressive Disorder   nicotine 21 mg/24hr patch Commonly known as: NICODERM CQ - dosed in mg/24 hours Place 1 patch (21 mg total) onto the skin daily. Start taking on: July 05, 2022  Indication: Nicotine Addiction   QUEtiapine 100 MG tablet Commonly known as: SEROQUEL Take 1 tablet (100 mg total) by mouth at bedtime.  Indication: Major Depressive Disorder   traZODone 100 MG tablet Commonly known as: DESYREL Take 1 tablet (100 mg total) by mouth at bedtime as needed for sleep.  Indication: [facility] Follow up.   Why: You have an appointment scheduled to meet Lanae Boast, peer support specialist on Friday, 07/07/22 at 7am. Thanks! Contact information: [place] [address] [phone]                 Follow-up recommendations:  Other:  Follow-up with outpatient treatment through [facility] and follow-up medication management with me.  Prescriptions and a supply of medicine prescribed.  Patient aware he can return to the emergency room if necessary.  Strongly advised avoiding all intoxicating substances  Comments: See above  Signed: Alethia Berthold, MD 07/04/2022, 11:13 AM

## 2022-07-04 NOTE — Plan of Care (Signed)
  Problem: Education: Goal: Knowledge of General Education information will improve Description: Including pain rating scale, medication(s)/side effects and non-pharmacologic comfort measures Outcome: Progressing   Problem: Health Behavior/Discharge Planning: Goal: Ability to manage health-related needs will improve Outcome: Progressing   Problem: Clinical Measurements: Goal: Will remain free from infection Outcome: Progressing   Problem: Nutrition: Goal: Adequate nutrition will be maintained Outcome: Progressing   Problem: Safety: Goal: Ability to remain free from injury will improve Outcome: Progressing   

## 2022-07-04 NOTE — Progress Notes (Signed)
  [facility] Adult Case Management Discharge Plan :  Will you be returning to the same living situation after discharge:  Yes,  Patient to return to place of residence.  At discharge, do you have transportation home?: Yes,  Patient's family to assist with transportation from hospital.  Do you have the ability to pay for your medications: No. Patient has no listed insurance, to be provided with 7 day supply of medication at discharge. CSW has provided patient with clinic information for free/reduced medications.   Release of information consent forms completed and in the chart;  Patient's signature needed at discharge.  Patient to Follow up at:  Follow-up Information     [facility] Follow up.   Why: You have an appointment scheduled to meet Lanae Boast, peer support specialist on Friday, 07/07/22 at 7am. Thanks! Contact information: [place] [address] [phone]         [facility]. Call.   Specialty: Behavioral Health Why: Please be available for you scheduled PHONE appointment on 19 Oct at 1300PM. Contact information: [place] [phone]                Next level of care provider has access to [facility] and Suicide Prevention discussed: Yes,  SPE completed with patient and Judye Bos [phone])   Has patient been referred to the Quitline?: Patient refused referral Tobacco Use: Medium Risk (06/30/2022)   Patient History    Smoking Tobacco Use: Former    Smokeless Tobacco Use: Never    Passive Exposure: Not on file   Patient has been referred for addiction treatment: N/A Patient denies active substance use, UDS Negative for all, screened low risk during nursing admission (see [facility] quick tab).  Social History   Substance and Sexual Activity  Drug Use No   Social History   Substance and  Sexual Activity  Alcohol Use Yes   Comment: special - [place] Natalye Kott, LCSWA 07/04/2022, 2:11 PM

## 2022-07-04 NOTE — Progress Notes (Signed)
Recreation Therapy Notes  Date: 07/04/2022  Time: 10:40 am   Location: Craft room    Behavioral response: Appropriate  Intervention Topic:  Goals   Discussion/Intervention:  Group content on today was focused on goals. Patients described what goals are and how they define goals. Individuals expressed how they go about setting goals and reaching them. The group identified how important goals are and if they make short term goals to reach long term goals. Patients described how many goals they work on at a time and what affects them not reaching their goal. Individuals described how much time they put into planning and obtaining their goals. The group participated in the intervention "My Goal Board" and made personal goal boards to help them achieve their goal. Clinical Observations/Feedback: Patient came to group and defined goals as something to achieve. 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 Participant explained that goals are important decrease stress. Individual was social with peers and staff while participating in the intervention.    Krystn Dermody LRT/CTRS         Jean Skow 07/04/2022 12:14 PM

## 2022-07-04 NOTE — BHH Suicide Risk Assessment (Addendum)
[facility] Admission Suicide Risk Assessment   Nursing information obtained from:  Patient Demographic factors:  Male, Caucasian, Unemployed Current Mental Status:  Suicidal ideation indicated by patient Loss Factors:  Financial problems / change in socioeconomic status Historical Factors:  NA Risk Reduction Factors:  Religious beliefs about death  Total Time spent with patient: 45 minutes Principal Problem: Severe recurrent major depression without psychotic features ([place]) Diagnosis:  Principal Problem:   Severe recurrent major depression without psychotic features ([place])  Subjective Data: Patient seen and chart reviewed.  56 year old man with a history of depression and ADHD presented to the hospital with emotional dyscontrol some passive suicidal statements agitation.  Patient had not attempted to harm himself.  He is agreeable to inpatient treatment.  Does not appear to be psychotic.  Does not have a past history of suicidal behavior  Continued Clinical Symptoms:  Alcohol Use Disorder Identification Test Final Score (AUDIT): 1 The "Alcohol Use Disorders Identification Test", Guidelines for Use in Primary Care, Second Edition.  World Pharmacologist [facility]). Score between 0-7:  no or low risk or alcohol related problems. Score between 8-15:  moderate risk of alcohol related problems. Score between 16-19:  high risk of alcohol related problems. Score 20 or above:  warrants further diagnostic evaluation for alcohol dependence and treatment.   CLINICAL FACTORS:   Depression:   Impulsivity   Musculoskeletal: Strength & Muscle Tone: within normal limits Gait & Station: normal Patient leans: N/A  Psychiatric Specialty Exam:  Presentation  General Appearance: No data recorded Eye Contact:No data recorded Speech:No data recorded Speech Volume:No data recorded Handedness:No data recorded  Mood and Affect  Mood:No data recorded Affect:No data recorded  Thought Process  Thought  Processes:No data recorded Descriptions of Associations:No data recorded Orientation:No data recorded Thought Content:No data recorded History of Schizophrenia/Schizoaffective disorder:No data recorded Duration of Psychotic Symptoms:No data recorded Hallucinations:No data recorded Ideas of Reference:No data recorded Suicidal Thoughts:No data recorded Homicidal Thoughts:No data recorded  Sensorium  Memory:No data recorded Judgment:No data recorded Insight:No data recorded  Executive Functions  Concentration:No data recorded Attention Span:No data recorded Recall:No data recorded Fund of Knowledge:No data recorded Language:No data recorded  Psychomotor Activity  Psychomotor Activity:No data recorded  Assets  Assets:No data recorded  Sleep  Sleep:No data recorded   Physical Exam: Physical Exam Vitals reviewed.  Constitutional:      Appearance: Normal appearance.  HENT:     Head: Normocephalic and atraumatic.     Mouth/Throat:     Pharynx: Oropharynx is clear.  Eyes:     Pupils: Pupils are equal, round, and reactive to light.  Cardiovascular:     Rate and Rhythm: Normal rate and regular rhythm.  Pulmonary:     Effort: Pulmonary effort is normal.     Breath sounds: Normal breath sounds.  Abdominal:     General: Abdomen is flat.     Palpations: Abdomen is soft.  Musculoskeletal:        General: Normal range of motion.  Skin:    General: Skin is warm and dry.  Neurological:     General: No focal deficit present.     Mental Status: He is alert. Mental status is at baseline.  Psychiatric:        Attention and Perception: Attention normal.        Mood and Affect: Mood is anxious and depressed. Affect is labile.        Speech: Speech is tangential.        Behavior: Behavior  is agitated. Behavior is not aggressive.        Thought Content: Thought content includes suicidal ideation. Thought content does not include suicidal plan.        Cognition and Memory:  Cognition normal.    Review of Systems  Constitutional: Negative.   HENT: Negative.    Eyes: Negative.   Respiratory: Negative.    Cardiovascular: Negative.   Gastrointestinal: Negative.   Musculoskeletal: Negative.   Skin: Negative.   Neurological: Negative.   Psychiatric/Behavioral:  Positive for depression and suicidal ideas. The patient is nervous/anxious and has insomnia.    Blood pressure 110/65, pulse 73, temperature 97.7 F (36.5 C), temperature source Oral, resp. rate 18, height 5\' 11"  (1.803 m), weight 54.9 kg, SpO2 97 %. Body mass index is 16.88 kg/m.   COGNITIVE FEATURES THAT CONTRIBUTE TO RISK:  Polarized thinking    SUICIDE RISK:   Minimal: No identifiable suicidal ideation.  Patients presenting with no risk factors but with morbid ruminations; may be classified as minimal risk based on the severity of the depressive symptoms  PLAN OF CARE: 15-minute checks in the hospital.  Ongoing assessment of dangerousness.  Medication and therapy for appropriate treatment.  I certify that inpatient services furnished can reasonably be expected to improve the patient's condition.   , MD 07/04/2022, 11:04 AM

## 2022-07-04 NOTE — Progress Notes (Signed)
Discharge note: Survey completed. RN met with pt and reviewed pt's discharge instructions. Pt verbalized understanding of discharge instructions and pt did not have any questions. RN reviewed and provided pt with a copy of SRA, AVS and Transition Record. RN returned pt's belongings to pt. Prescriptions and samples were given to pt. Pt denied SI/HI/AVH and voiced no concerns. Pt was appreciative of the care pt received at [facility]. Patient discharged to their ride without incident.
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
## 2022-07-13 ENCOUNTER — Telehealth (INDEPENDENT_AMBULATORY_CARE_PROVIDER_SITE_OTHER): Payer: Self-pay | Admitting: Psychiatry

## 2022-07-13 DIAGNOSIS — F331 Major depressive disorder, recurrent, moderate: Secondary | ICD-10-CM

## 2022-07-13 DIAGNOSIS — F902 Attention-deficit hyperactivity disorder, combined type: Secondary | ICD-10-CM

## 2022-07-13 NOTE — Progress Notes (Signed)
Virtual Visit via Telephone Note  I connected with Moses Manners on 07/13/22 at  2:00 PM EDT by telephone and verified that I am speaking with the correct person using two identifiers.  Location: Patient: Home Provider: Hospital   I discussed the limitations, risks, security and privacy concerns of performing an evaluation and management service by telephone and the availability of in person appointments. I also discussed with the patient that there may be a patient responsible charge related to this service. The patient expressed understanding and agreed to proceed.   History of Present Illness: Patient seen chart reviewed.  Patient was reached by telephone identities established.  Patient reports he is feeling much better.  He sounds more like his normal self.  Upbeat affect.  Lucid thoughts.  No sign of delusional thinking.  Denies any suicidal ideation.  Reports that he is taking his medicine regularly with family assistance and that he has been sleeping better.  Describes in some detail how he has been looking for a job    Observations/Objective: Alert and oriented.  Upbeat affect lucid appropriate thought content   Assessment and Plan: Seems to be stabilizing.  Supportive counseling.  Review of medicine plan.  Continue all current medicine and would like to speak again in 2 weeks   Follow Up Instructions: 2 weeks   I discussed the assessment and treatment plan with the patient. The patient was provided an opportunity to ask questions and all were answered. The patient agreed with the plan and demonstrated an understanding of the instructions.   The patient was advised to call back or seek an in-person evaluation if the symptoms worsen or if the condition fails to improve as anticipated.  I provided 20 minutes of non-face-to-face time during this encounter.   Alethia Berthold, MD

## 2022-08-01 ENCOUNTER — Other Ambulatory Visit: Payer: Self-pay | Admitting: Psychiatry

## 2022-08-01 ENCOUNTER — Telehealth (INDEPENDENT_AMBULATORY_CARE_PROVIDER_SITE_OTHER): Payer: Self-pay | Admitting: Psychiatry

## 2022-08-01 DIAGNOSIS — F902 Attention-deficit hyperactivity disorder, combined type: Secondary | ICD-10-CM

## 2022-08-01 DIAGNOSIS — F331 Major depressive disorder, recurrent, moderate: Secondary | ICD-10-CM

## 2022-08-01 MED ORDER — TRAZODONE HCL 100 MG PO TABS: 100.0000 mg | ORAL_TABLET | Freq: Every evening | ORAL | 2 refills | Status: DC | PRN

## 2022-08-01 MED ORDER — CLONAZEPAM 0.5 MG PO TABS: 0.5000 mg | ORAL_TABLET | Freq: Two times a day (BID) | ORAL | 2 refills | Status: DC

## 2022-08-01 MED ORDER — ESCITALOPRAM OXALATE 10 MG PO TABS: 15.0000 mg | ORAL_TABLET | Freq: Every day | ORAL | 2 refills | Status: DC

## 2022-08-01 MED ORDER — AMPHETAMINE-DEXTROAMPHET ER 20 MG PO CP24: 40.0000 mg | ORAL_CAPSULE | Freq: Every day | ORAL | 0 refills | Status: DC

## 2022-08-01 MED ORDER — QUETIAPINE FUMARATE 100 MG PO TABS: 100.0000 mg | ORAL_TABLET | Freq: Every day | ORAL | 2 refills | Status: DC

## 2022-08-01 NOTE — Progress Notes (Signed)
Virtual Visit via Telephone Note  I connected with Vincent [name] on 08/01/22 at  2:00 PM EST by telephone and verified that I am speaking with the correct person using two identifiers.  Location: Patient: Home Provider: Hospital   I discussed the limitations, risks, security and privacy concerns of performing an evaluation and management service by telephone and the availability of in person appointments. I also discussed with the patient that there may be a patient responsible charge related to this service. The patient expressed understanding and agreed to proceed.   History of Present Illness: Patient reached by telephone.  He was appropriate and on time for the appointment.  Patient reports that overall he feels things have been going very well.  His mood is remains stable without any returns to severe depression.  He is not having any suicidal thoughts.  He reports occasional anxiety still present when taking care of his father but it is manageable.  Father is now in hospice care and [facility] is able to take responsibility for some of his father's care without getting too overwhelmed.  Tolerating medicine well.    Observations/Objective: Alert and oriented.  Euthymic.  Lucid thinking.  No evidence of loosening of associations or suicidal thinking.  Assessment and Plan: Reviewed medications.  Continue all medicines.  He needed to switch pharmacies to [facility] and that is done.  Follow up in 3 to 4 weeks  Follow Up Instructions:    I discussed the assessment and treatment plan with the patient. The patient was provided an opportunity to ask questions and all were answered. The patient agreed with the plan and demonstrated an understanding of the instructions.   The patient was advised to call back or seek an in-person evaluation if the symptoms worsen or if the condition fails to improve as anticipated.  I provided 20 minutes of non-face-to-face time during this encounter.   Alethia Berthold, MD

## 2022-08-17 ENCOUNTER — Other Ambulatory Visit: Payer: Self-pay

## 2022-08-17 ENCOUNTER — Encounter: Payer: Self-pay | Admitting: Emergency Medicine

## 2022-08-17 ENCOUNTER — Emergency Department
Admission: EM | Admit: 2022-08-17 | Discharge: 2022-08-18 | Disposition: A | Discharge status: Home / Self Care | Payer: 59 | Attending: Emergency Medicine | Admitting: Emergency Medicine

## 2022-08-17 DIAGNOSIS — F32A Depression, unspecified: Secondary | ICD-10-CM | POA: Diagnosis present

## 2022-08-17 DIAGNOSIS — R45851 Suicidal ideations: Secondary | ICD-10-CM | POA: Insufficient documentation

## 2022-08-17 DIAGNOSIS — F419 Anxiety disorder, unspecified: Secondary | ICD-10-CM | POA: Insufficient documentation

## 2022-08-17 DIAGNOSIS — J45909 Unspecified asthma, uncomplicated: Secondary | ICD-10-CM | POA: Insufficient documentation

## 2022-08-17 DIAGNOSIS — Z20822 Contact with and (suspected) exposure to covid-19: Secondary | ICD-10-CM | POA: Insufficient documentation

## 2022-08-17 DIAGNOSIS — F331 Major depressive disorder, recurrent, moderate: Secondary | ICD-10-CM | POA: Insufficient documentation

## 2022-08-17 DIAGNOSIS — Z87891 Personal history of nicotine dependence: Secondary | ICD-10-CM | POA: Insufficient documentation

## 2022-08-17 DIAGNOSIS — F902 Attention-deficit hyperactivity disorder, combined type: Secondary | ICD-10-CM | POA: Insufficient documentation

## 2022-08-17 LAB — COMPREHENSIVE METABOLIC PANEL
ALT: 8 U/L (ref 0–44)
AST: 16 U/L (ref 15–41)
Albumin: 4 g/dL (ref 3.5–5.0)
Alkaline Phosphatase: 60 U/L (ref 38–126)
Anion gap: 5 (ref 5–15)
BUN: 9 mg/dL (ref 6–20)
CO2: 35 mmol/L — ABNORMAL HIGH (ref 22–32)
Calcium: 8.7 mg/dL — ABNORMAL LOW (ref 8.9–10.3)
Chloride: 105 mmol/L (ref 98–111)
Creatinine, Ser: 0.87 mg/dL (ref 0.61–1.24)
GFR, Estimated: >60 mL/min (ref >60)
Glucose, Bld: 88 mg/dL (ref 70–99)
Potassium: 4.1 mmol/L (ref 3.5–5.1)
Sodium: 145 mmol/L (ref 135–145)
Total Bilirubin: 0.5 mg/dL (ref 0.3–1.2)
Total Protein: 6.7 g/dL (ref 6.5–8.1)

## 2022-08-17 LAB — URINE DRUG SCREEN, QUALITATIVE (ARMC ONLY)
Amphetamines, Ur Screen: POSITIVE — AB
Barbiturates, Ur Screen: NOT DETECTED
Benzodiazepine, Ur Scrn: POSITIVE — AB
Cannabinoid 50 Ng, Ur ~~LOC~~: POSITIVE — AB
Cocaine Metabolite,Ur ~~LOC~~: NOT DETECTED
MDMA (Ecstasy)Ur Screen: NOT DETECTED
Methadone Scn, Ur: NOT DETECTED
Opiate, Ur Screen: NOT DETECTED
Phencyclidine (PCP) Ur S: NOT DETECTED
Tricyclic, Ur Screen: POSITIVE — AB

## 2022-08-17 LAB — CBC
HCT: 43.1 % (ref 39.0–52.0)
Hemoglobin: 14.6 g/dL (ref 13.0–17.0)
MCH: 31.6 pg (ref 26.0–34.0)
MCHC: 33.9 g/dL (ref 30.0–36.0)
MCV: 93.3 fL (ref 80.0–100.0)
Platelets: 324 10*3/uL (ref 150–400)
RBC: 4.62 MIL/uL (ref 4.22–5.81)
RDW: 12.9 % (ref 11.5–15.5)
WBC: 6.4 10*3/uL (ref 4.0–10.5)
nRBC: 0 % (ref 0.0–0.2)

## 2022-08-17 LAB — ETHANOL: Alcohol, Ethyl (B): <10 mg/dL (ref <10)

## 2022-08-17 LAB — ACETAMINOPHEN LEVEL: Acetaminophen (Tylenol), Serum: <10 ug/mL — ABNORMAL LOW (ref 10–30)

## 2022-08-17 LAB — SALICYLATE LEVEL: Salicylate Lvl: <7 mg/dL — ABNORMAL LOW (ref 7.0–30.0)

## 2022-08-17 MED ORDER — TRAZODONE HCL 100 MG PO TABS: 100.0000 mg | ORAL_TABLET | Freq: Every evening | ORAL | Status: DC | PRN

## 2022-08-17 MED ORDER — ESCITALOPRAM OXALATE 10 MG PO TABS
15.0000 mg | ORAL_TABLET | Freq: Every day | ORAL | Status: DC
  Administered 2022-08-18: 15 mg via ORAL
  Filled 2022-08-17: qty 2

## 2022-08-17 MED ORDER — QUETIAPINE FUMARATE 25 MG PO TABS
100.0000 mg | ORAL_TABLET | Freq: Every day | ORAL | Status: DC
  Administered 2022-08-17: 100 mg via ORAL
  Filled 2022-08-17: qty 4

## 2022-08-17 MED ORDER — AMPHETAMINE-DEXTROAMPHET ER 20 MG PO CP24: 40.0000 mg | ORAL_CAPSULE | Freq: Every day | ORAL | Status: DC

## 2022-08-17 MED ORDER — CLONAZEPAM 0.5 MG PO TABS
0.5000 mg | ORAL_TABLET | Freq: Two times a day (BID) | ORAL | Status: DC
  Administered 2022-08-17 – 2022-08-18 (×2): 0.5 mg via ORAL
  Filled 2022-08-17 (×2): qty 1

## 2022-08-17 NOTE — ED Notes (Signed)
EDP at bedside  

## 2022-08-17 NOTE — ED Provider Notes (Signed)
[facility] Provider Note    Event Date/Time   First MD Initiated Contact with Patient 08/17/22 2259     (approximate)   History   Suicidal   HPI  Vincent [name] is a 56 y.o. male with history of depression, ADHD who presents to the emergency department with complaints of suicidal thoughts.  Recently lost his father a week and a half ago.  No HI or hallucinations but reports his mind has been racing "dark thoughts".  Has had recent psychiatric admission.   History provided by patient.    Past Medical History:  Diagnosis Date   ADHD (attention deficit hyperactivity disorder)    Asthma     Past Surgical History:  Procedure Laterality Date   CHOLECYSTECTOMY     COLON SURGERY      MEDICATIONS:  Prior to Admission medications   Medication Sig Start Date End Date Taking? Authorizing Provider  amphetamine-dextroamphetamine (ADDERALL XR) 20 MG 24 hr capsule Take 2 capsules (40 mg total) by mouth daily. 08/01/22   Clapacs, Jackquline Denmark, MD  clonazePAM (KLONOPIN) 0.5 MG tablet Take 1 tablet (0.5 mg total) by mouth 2 (two) times daily. 08/01/22   Clapacs, Jackquline Denmark, MD  escitalopram (LEXAPRO) 10 MG tablet Take 1.5 tablets (15 mg total) by mouth daily. 08/01/22   Clapacs, Jackquline Denmark, MD  nicotine (NICODERM CQ - DOSED IN MG/24 HOURS) 21 mg/24hr patch Place 1 patch (21 mg total) onto the skin daily. 07/05/22   Clapacs, Jackquline Denmark, MD  QUEtiapine (SEROQUEL) 100 MG tablet Take 1 tablet (100 mg total) by mouth at bedtime. 08/01/22   Clapacs, Jackquline Denmark, MD  traZODone (DESYREL) 100 MG tablet Take 1 tablet (100 mg total) by mouth at bedtime as needed for sleep. 08/01/22   Clapacs, Jackquline Denmark, MD    Physical Exam   Triage Vital Signs: ED Triage Vitals  Enc Vitals Group     BP 08/17/22 2235 119/67     Pulse Rate 08/17/22 2235 96     Resp 08/17/22 2235 16     Temp 08/17/22 2235 98.2 F (36.8 C)     Temp Source 08/17/22 2235 Oral     SpO2 08/17/22 2235 98 %     Weight 08/17/22 2226 122  lb (55.3 kg)     Height 08/17/22 2226 5\' 11"  (1.803 m)     Head Circumference --      Peak Flow --      Pain Score 08/17/22 2225 0     Pain Loc --      Pain Edu? --      Excl. in GC? --     Most recent vital signs: Vitals:   08/17/22 2235  BP: 119/67  Pulse: 96  Resp: 16  Temp: 98.2 F (36.8 C)  SpO2: 98%    CONSTITUTIONAL: Alert and oriented and responds appropriately to questions. Well-appearing; well-nourished HEAD: Normocephalic, atraumatic EYES: Conjunctivae clear, pupils appear equal, sclera nonicteric ENT: normal nose; moist mucous membranes NECK: Supple, normal ROM CARD: RRR; S1 and S2 appreciated; no murmurs, no clicks, no rubs, no gallops RESP: Normal chest excursion without splinting or tachypnea; breath sounds clear and equal bilaterally; no wheezes, no rhonchi, no rales, no hypoxia or respiratory distress, speaking full sentences ABD/GI: Normal bowel sounds; non-distended; soft, non-tender, no rebound, no guarding, no peritoneal signs BACK: The back appears normal EXT: Normal ROM in all joints; no deformity noted, no edema; no cyanosis SKIN: Normal color for age and  race; warm; no rash on exposed skin NEURO: Moves all extremities equally, normal speech PSYCH: Flat affect.  States he is suicidal.   ED Results / Procedures / Treatments   LABS: (all labs ordered are listed, but only abnormal results are displayed) Labs Reviewed  COMPREHENSIVE METABOLIC PANEL - Abnormal; Notable for the following components:      Result Value   CO2 35 (*)    Calcium 8.7 (*)    All other components within normal limits  SALICYLATE LEVEL - Abnormal; Notable for the following components:   Salicylate Lvl <7.0 (*)    All other components within normal limits  ACETAMINOPHEN LEVEL - Abnormal; Notable for the following components:   Acetaminophen (Tylenol), Serum <10 (*)    All other components within normal limits  URINE DRUG SCREEN, QUALITATIVE ([facility] ONLY) - Abnormal; Notable  for the following components:   Tricyclic, Ur Screen POSITIVE (*)    Amphetamines, Ur Screen POSITIVE (*)    Cannabinoid 50 Ng, Ur [place] POSITIVE (*)    Benzodiazepine, Ur Scrn POSITIVE (*)    All other components within normal limits  SARS CORONAVIRUS 2 BY RT PCR  ETHANOL  CBC     EKG:   RADIOLOGY: My personal review and interpretation of imaging:    I have personally reviewed all radiology reports.   No results found.   PROCEDURES:  Critical Care performed: No      Procedures    IMPRESSION / MDM / ASSESSMENT AND PLAN / ED COURSE  I reviewed the triage vital signs and the nursing notes.    Patient here with suicidal thoughts.  Currently here voluntarily.     DIFFERENTIAL DIAGNOSIS (includes but not limited to):   Pression, anxiety, suicidal ideation   Patient's presentation is most consistent with acute presentation with potential threat to life or bodily function.   PLAN: Will obtain psychiatric screening labs and consult psychiatry, TTS for further disposition.   MEDICATIONS GIVEN IN ED: Medications  amphetamine-dextroamphetamine (ADDERALL XR) 24 hr capsule 40 mg (has no administration in time range)  clonazePAM (KLONOPIN) tablet 0.5 mg (0.5 mg Oral Given 08/17/22 2326)  escitalopram (LEXAPRO) tablet 15 mg (has no administration in time range)  QUEtiapine (SEROQUEL) tablet 100 mg (100 mg Oral Given 08/17/22 2326)  traZODone (DESYREL) tablet 100 mg (has no administration in time range)     ED COURSE: Patient's labs show no leukocytosis, normal hemoglobin, normal creatinine, LFTs, negative Tylenol, salicylate and alcohol levels.  Drug screen positive for tricyclics, amphetamines, cannabinoids and benzodiazepines.  Medically cleared at this time.   CONSULTS: Psychiatry and TTS consulted.  Psychiatry plans to reassess in the morning for further disposition.   OUTSIDE RECORDS REVIEWED: Reviewed patient's previous psychiatric admission in October  2023.       FINAL CLINICAL IMPRESSION(S) / ED DIAGNOSES   Final diagnoses:  Suicidal ideation     Rx / DC Orders   ED Discharge Orders     None        Note:  This document was prepared using Dragon voice recognition software and may include unintentional dictation errors.   Jabar Krysiak, Layla Maw, DO 08/18/22 0021

## 2022-08-17 NOTE — ED Notes (Signed)
Pt's belongings were taken home by sister Angelia. Sister requests updates. Contact information in chart verified.

## 2022-08-17 NOTE — ED Notes (Signed)
Pt states not taking his night medications prior to arrival. Medications ordered by EDP Ward (see MAR) provided to pt. Pt cooperative with staff. Currently resting in hallway bed. Pt updated in regard of plan, informed pending telehealth assessment. Pt remains voluntary.

## 2022-08-17 NOTE — ED Notes (Signed)
Pt calmly walked to [facility] and allowed security to wand him.

## 2022-08-17 NOTE — ED Triage Notes (Signed)
Pt BIB Sister Whitt for psychiatric evaluation. Pt endorses suicidal ideation; states "I prefer to be dead." No plan or intent to carry out a plan. Also states " I don't trust my own mind." Denies hallucinations. Endorses feelings of anxiety.  Recent loss of father 1.5 weeks ago.

## 2022-08-18 DIAGNOSIS — F331 Major depressive disorder, recurrent, moderate: Secondary | ICD-10-CM | POA: Diagnosis not present

## 2022-08-18 LAB — SARS CORONAVIRUS 2 BY RT PCR: SARS Coronavirus 2 by RT PCR: NEGATIVE

## 2022-08-18 MED ORDER — QUETIAPINE FUMARATE 100 MG PO TABS: 100.0000 mg | ORAL_TABLET | Freq: Every day | ORAL | 2 refills | Status: DC

## 2022-08-18 MED ORDER — AMPHETAMINE-DEXTROAMPHET ER 5 MG PO CP24: 20.0000 mg | ORAL_CAPSULE | Freq: Every day | ORAL | Status: DC

## 2022-08-18 NOTE — Consult Note (Signed)
[facility] Face-to-Face Psychiatry Consult   Reason for Consult:Suicidal  Referring Physician: Dr. Elesa Massed Patient Identification: SAAHIR PRUDE MRN:  [id number] Principal Diagnosis: Moderate episode of recurrent major depressive disorder (HCC) Diagnosis:  Principal Problem:   Moderate episode of recurrent major depressive disorder (HCC) Active Problems:   ADHD (attention deficit hyperactivity disorder), combined type   Anxiety   History of tobacco abuse   Suicidal ideation  Total Time spent with patient: 30 minutes  Subjective: "I'm ready to leave and follow up with outpatient.  Client reports being depressed and negative without suicidal ideations, "I have a big taboo against it."  Anxiety is mild to moderate with no panic attacks since admission.  Sleep is good with his medications, appetite is fair.  Denies side effects from his medications and sees Dr Toni Amend in outpatient.  Collateral information obtained from his sister with his permission that he lives with, Romie Jumper.  "I don't have any safety concerns about him coming home."  She states there are guns in the home of hers and her husband, locked in a safe with the client not knowing the combination.  No homicidal ideations, hallucinations, or withdrawal symptoms.  Client does have a tendency per his sister to take whatever anyone gives him to get high.  Psych cleared.  HPI per Elenore Paddy, PMHNP ABEL HAGEMAN is a 56 y.o. male patient presented to [facility] ED via POV voluntarily. The patient is verbalizing passive suicidal ideation. The patient was recently admitted and discharged. The patient reports being on his medication and being compliant. The patient's UDS is remarkable for all prescribed medications and Cannabinoids.    This provider saw The patient face-to-face; the chart was reviewed, and Dr. Elesa Massed was consulted on 08/17/2022 due to the patient's care. It was discussed with both providers that the patient remained under  observation overnight and will be reassessed in the a.m. to determine if he meets the criteria for psychiatric inpatient admission; he could be discharged home. The patient is very sleepy. He is slurring his words; he has taken his night medications.   Upon evaluation, the patient is alert and oriented x4, calm, cooperative, and mood-congruent with affect. The patient does not appear to be responding to internal or external stimuli. Neither is the patient presenting with any delusional thinking. The patient denies auditory or visual hallucinations. The patient admits to passive suicidal ideations but denies homicidal or self-harm ideations. The patient is not presenting with any psychotic or paranoid behaviors. During an encounter with the patient, he could answer questions appropriately.   HPI: Per Dr. Elesa Massed, KAYDEN HUTMACHER is a 56 y.o. male with history of depression, ADHD who presents to the emergency department with complaints of suicidal thoughts.  Recently lost his father a week and a half ago.  No HI or hallucinations but reports his mind has been racing "dark thoughts".  Has had recent psychiatric admission.   Past Psychiatric History:  ADHD (attention deficit hyperactivity disorder)  Risk to Self:  none Risk to Others:  none Prior Inpatient Therapy:  years ago Prior Outpatient Therapy:  Dr Toni Amend  Past Medical History:  Past Medical History:  Diagnosis Date   ADHD (attention deficit hyperactivity disorder)    Asthma     Past Surgical History:  Procedure Laterality Date   CHOLECYSTECTOMY     COLON SURGERY     Family History:  Family History  Problem Relation Age of Onset   Diabetes Mother    Alzheimer's disease Mother  Depression Sister    Hypertension Father    Family Psychiatric  History:  Alzheimer's disease Mother   Depression Sister   Social History:  Social History   Substance and Sexual Activity  Alcohol Use Yes   Comment: special - occ     Social  History   Substance and Sexual Activity  Drug Use No    Social History   Socioeconomic History   Marital status: Married    Spouse name: Not on file   Number of children: Not on file   Years of education: Not on file   Highest education level: Not on file  Occupational History   Not on file  Tobacco Use   Smoking status: Former    Packs/day: 1.00    Types: Cigarettes   Smokeless tobacco: Never   Tobacco comments:    "I quit months ago"  Vaping Use   Vaping Use: Never used  Substance and Sexual Activity   Alcohol use: Yes    Comment: special - occ   Drug use: No   Sexual activity: Never  Other Topics Concern   Not on file  Social History Narrative   Not on file   Social Determinants of Health   Financial Resource Strain: Low Risk  (12/06/2017)   Overall Financial Resource Strain (CARDIA)    Difficulty of Paying Living Expenses: Not hard at all  Food Insecurity: No Food Insecurity (06/30/2022)   Hunger Vital Sign    Worried About Running Out of Food in the Last Year: Never true    Ran Out of Food in the Last Year: Never true  Transportation Needs: No Transportation Needs (06/30/2022)   PRAPARE - Administrator, Civil Service (Medical): No    Lack of Transportation (Non-Medical): No  Physical Activity: Not on file  Stress: Not on file  Social Connections: Not on file   Additional Social History:    Allergies:  No Known Allergies  Labs:  Results for orders placed or performed during the hospital encounter of 08/17/22 (from the past 48 hour(s))  Comprehensive metabolic panel     Status: Abnormal   Collection Time: 08/17/22 10:37 PM  Result Value Ref Range   Sodium 145 135 - 145 mmol/L   Potassium 4.1 3.5 - 5.1 mmol/L   Chloride 105 98 - 111 mmol/L   CO2 35 (H) 22 - 32 mmol/L   Glucose, Bld 88 70 - 99 mg/dL    Comment: Glucose reference range applies only to samples taken after fasting for at least 8 hours.   BUN 9 6 - 20 mg/dL   Creatinine, Ser  1.61 0.61 - 1.24 mg/dL   Calcium 8.7 (L) 8.9 - 10.3 mg/dL   Total Protein 6.7 6.5 - 8.1 g/dL   Albumin 4.0 3.5 - 5.0 g/dL   AST 16 15 - 41 U/L   ALT 8 0 - 44 U/L   Alkaline Phosphatase 60 38 - 126 U/L   Total Bilirubin 0.5 0.3 - 1.2 mg/dL   GFR, Estimated >09 >60 mL/min    Comment: (NOTE) Calculated using the CKD-EPI Creatinine Equation (2021)    Anion gap 5 5 - 15    Comment: Performed at [facility], [address], [place], [place] [address]  Ethanol     Status: None   Collection Time: 08/17/22 10:37 PM  Result Value Ref Range   Alcohol, Ethyl (B) <10 <10 mg/dL    Comment: (NOTE) Lowest detectable limit for serum alcohol  is 10 mg/dL.  For medical purposes only. Performed at [facility], [address] [place], [place] [address]   Salicylate level     Status: Abnormal   Collection Time: 08/17/22 10:37 PM  Result Value Ref Range   Salicylate Lvl <7.0 (L) 7.0 - 30.0 mg/dL    Comment: Performed at [facility], [address] [place], [place] [address]  Acetaminophen level     Status: Abnormal   Collection Time: 08/17/22 10:37 PM  Result Value Ref Range   Acetaminophen (Tylenol), Serum <10 (L) 10 - 30 ug/mL    Comment: (NOTE) Therapeutic concentrations vary significantly. A range of 10-30 ug/mL  may be an effective concentration for many patients. However, some  are best treated at concentrations outside of this range. Acetaminophen concentrations >150 ug/mL at 4 hours after ingestion  and >50 ug/mL at 12 hours after ingestion are often associated with  toxic reactions.  Performed at [facility], [address] [place], [place] [address]   cbc     Status: None   Collection Time: 08/17/22 10:37 PM  Result Value Ref Range   WBC 6.4 4.0 - 10.5 K/uL   RBC 4.62 4.22 - 5.81 MIL/uL   Hemoglobin 14.6 13.0 - 17.0 g/dL   HCT 77.9 39.0 - 30.0 %   MCV 93.3 80.0 - 100.0 fL   MCH 31.6 26.0 - 34.0 pg   MCHC 33.9 30.0 - 36.0 g/dL    RDW 92.3 30.0 - 76.2 %   Platelets 324 150 - 400 K/uL   nRBC 0.0 0.0 - 0.2 %    Comment: Performed at [facility], [address], [place], [place] [address]  Urine Drug Screen, Qualitative     Status: Abnormal   Collection Time: 08/17/22 10:37 PM  Result Value Ref Range   Tricyclic, Ur Screen POSITIVE (A) NONE DETECTED   Amphetamines, Ur Screen POSITIVE (A) NONE DETECTED   MDMA (Ecstasy)Ur Screen NONE DETECTED NONE DETECTED   Cocaine Metabolite,Ur [place] NONE DETECTED NONE DETECTED   Opiate, Ur Screen NONE DETECTED NONE DETECTED   Phencyclidine (PCP) Ur S NONE DETECTED NONE DETECTED   Cannabinoid 50 Ng, Ur [place] POSITIVE (A) NONE DETECTED   Barbiturates, Ur Screen NONE DETECTED NONE DETECTED   Benzodiazepine, Ur Scrn POSITIVE (A) NONE DETECTED   Methadone Scn, Ur NONE DETECTED NONE DETECTED    Comment: (NOTE) Tricyclics + metabolites, urine    Cutoff 1000 ng/mL Amphetamines + metabolites, urine  Cutoff 1000 ng/mL MDMA (Ecstasy), urine              Cutoff 500 ng/mL Cocaine Metabolite, urine          Cutoff 300 ng/mL Opiate + metabolites, urine        Cutoff 300 ng/mL Phencyclidine (PCP), urine         Cutoff 25 ng/mL Cannabinoid, urine                 Cutoff 50 ng/mL Barbiturates + metabolites, urine  Cutoff 200 ng/mL Benzodiazepine, urine              Cutoff 200 ng/mL Methadone, urine                   Cutoff 300 ng/mL  The urine drug screen provides only a preliminary, unconfirmed analytical test result and should not be used for non-medical purposes. Clinical consideration and professional judgment should be applied to any positive drug screen result due to possible  interfering substances. A more specific alternate chemical method must be used in order to obtain a confirmed analytical result. Gas chromatography / mass spectrometry (GC/MS) is the preferred confirm atory method. Performed at [facility], [address] [place], [place] [address]   SARS  Coronavirus 2 by RT PCR (hospital order, performed in [facility] hospital lab) *cepheid single result test* Anterior Nasal Swab     Status: None   Collection Time: 08/17/22 10:49 PM   Specimen: Anterior Nasal Swab  Result Value Ref Range   SARS Coronavirus 2 by RT PCR NEGATIVE NEGATIVE    Comment: (NOTE) SARS-CoV-2 target nucleic acids are NOT DETECTED.  The SARS-CoV-2 RNA is generally detectable in upper and lower respiratory specimens during the acute phase of infection. The lowest concentration of SARS-CoV-2 viral copies this assay can detect is 250 copies / mL. A negative result does not preclude SARS-CoV-2 infection and should not be used as the sole basis for treatment or other patient management decisions.  A negative result may occur with improper specimen collection / handling, submission of specimen other than nasopharyngeal swab, presence of viral mutation(s) within the areas targeted by this assay, and inadequate number of viral copies (<250 copies / mL). A negative result must be combined with clinical observations, patient history, and epidemiological information.  Fact Sheet for Patients:   RoadLapTop.co.za  Fact Sheet for Healthcare Providers: http://kim-miller.com/  This test is not yet approved or  cleared by the [place] FDA and has been authorized for detection and/or diagnosis of SARS-CoV-2 by FDA under an Emergency Use Authorization (EUA).  This EUA will remain in effect (meaning this test can be used) for the duration of the COVID-19 declaration under Section 564(b)(1) of the Act, 21 U.S.C. section 360bbb-3(b)(1), unless the authorization is terminated or revoked sooner.  Performed at [facility], [address] [place], [place] [address]     Current Facility-Administered Medications  Medication Dose Route Frequency Provider Last Rate Last Daun Peacock ON 08/19/2022]  amphetamine-dextroamphetamine (ADDERALL XR) 24 hr capsule 20 mg  20 mg Oral Daily Charm Rings, NP       clonazePAM Scarlette Calico) tablet 0.5 mg  0.5 mg Oral BID Ward, Kristen N, DO   0.5 mg at 08/17/22 2326   escitalopram (LEXAPRO) tablet 15 mg  15 mg Oral Daily Ward, Kristen N, DO       QUEtiapine (SEROQUEL) tablet 100 mg  100 mg Oral QHS Ward, Kristen N, DO   100 mg at 08/17/22 2326   traZODone (DESYREL) tablet 100 mg  100 mg Oral QHS PRN Ward, Layla Maw, DO       Current Outpatient Medications  Medication Sig Dispense Refill   amphetamine-dextroamphetamine (ADDERALL XR) 20 MG 24 hr capsule Take 2 capsules (40 mg total) by mouth daily. 60 capsule 0   clonazePAM (KLONOPIN) 0.5 MG tablet Take 1 tablet (0.5 mg total) by mouth 2 (two) times daily. 60 tablet 2   escitalopram (LEXAPRO) 10 MG tablet Take 1.5 tablets (15 mg total) by mouth daily. 45 tablet 2   nicotine (NICODERM CQ - DOSED IN MG/24 HOURS) 21 mg/24hr patch Place 1 patch (21 mg total) onto the skin daily. 28 patch 0   QUEtiapine (SEROQUEL) 100 MG tablet Take 1 tablet (100 mg total) by mouth at bedtime. 30 tablet 2   traZODone (DESYREL) 100 MG tablet Take 1 tablet (100 mg total) by mouth at bedtime as needed for sleep. 30 tablet 2  Musculoskeletal: Strength & Muscle Tone: within normal limits Gait & Station: normal Patient leans: N/A Psychiatric Specialty Exam:  Psychiatric Specialty Exam: Physical Exam Vitals and nursing note reviewed.  Constitutional:      Appearance: Normal appearance.  HENT:     Head: Normocephalic.     Nose: Nose normal.  Pulmonary:     Effort: Pulmonary effort is normal.  Musculoskeletal:        General: Normal range of motion.     Cervical back: Normal range of motion.  Neurological:     General: No focal deficit present.     Mental Status: He is alert and oriented to person, place, and time.  Psychiatric:        Attention and Perception: Attention and perception normal.        Mood and  Affect: Mood is anxious and depressed. Affect is blunt.        Speech: Speech normal.        Behavior: Behavior normal.        Thought Content: Thought content normal.        Cognition and Memory: Cognition and memory normal.        Judgment: Judgment normal.     Review of Systems  Psychiatric/Behavioral:  Positive for depression. The patient is nervous/anxious.   All other systems reviewed and are negative.   Blood pressure 110/82, pulse 90, temperature 98.2 F (36.8 C), temperature source Oral, resp. rate 18, height 5\' 11"  (1.803 m), weight 55.3 kg, SpO2 95 %.Body mass index is 17.02 kg/m.  General Appearance: Casual  Eye Contact:  Good  Speech:  Normal Rate  Volume:  Normal  Mood:  Anxious and Depressed  Affect:  Blunt  Thought Process:  Coherent  Orientation:  Full (Time, Place, and Person)  Thought Content:  WDL and Logical  Suicidal Thoughts:  No  Homicidal Thoughts:  No  Memory:  Immediate;   Good Recent;   Good Remote;   Good  Judgement:  Fair  Insight:  Fair  Psychomotor Activity:  Normal  Concentration:  Concentration: Good and Attention Span: Good  Recall:  Good  Fund of Knowledge:  Good  Language:  Good  Akathisia:  No  Handed:  Right  AIMS (if indicated):     Assets:  Housing Leisure Time Physical Health Resilience Social Support  ADL's:  Intact  Cognition:  WNL  Sleep:         Physical Exam: Physical Exam Vitals and nursing note reviewed.  Constitutional:      Appearance: Normal appearance.  HENT:     Head: Normocephalic.     Nose: Nose normal.  Pulmonary:     Effort: Pulmonary effort is normal.  Musculoskeletal:        General: Normal range of motion.     Cervical back: Normal range of motion.  Neurological:     General: No focal deficit present.     Mental Status: He is alert and oriented to person, place, and time.  Psychiatric:        Attention and Perception: Attention and perception normal.        Mood and Affect: Mood is anxious  and depressed. Affect is blunt.        Speech: Speech normal.        Behavior: Behavior normal.        Thought Content: Thought content normal.        Cognition and Memory: Cognition and memory normal.  Judgment: Judgment normal.    Review of Systems  Psychiatric/Behavioral:  Positive for depression. The patient is nervous/anxious.   All other systems reviewed and are negative.  Blood pressure 119/67, pulse 96, temperature 98.2 F (36.8 C), temperature source Oral, resp. rate 16, height 5\' 11"  (1.803 m), weight 55.3 kg, SpO2 98 %. Body mass index is 17.02 kg/m.  Treatment Plan Summary: Major depressive disorder, moderate: Lexapro 20 mg daily  Insomnia: Seroquel 100 mg daily at bedtime Trazodone 100 mg daily at bedtime PRN  ADHD: Adderall 20 mg daily  Anxiety: Klonopin 0.5 mg BID PRN   Disposition: Discharge home, follow up with outpatient provider  Nanine MeansJamison Satcha Storlie, NP 08/18/2022 10:16 AM

## 2022-08-18 NOTE — ED Notes (Signed)
Pt given phone to call sister for ride home

## 2022-08-18 NOTE — ED Notes (Signed)
Pt to B[facility] room 3. Calm, cooperative, ambulatory. No complaints. States he will sleep through the night.

## 2022-08-18 NOTE — ED Provider Notes (Addendum)
Emergency Medicine Observation Re-evaluation Note  Vincent [name] is a 56 y.o. male, seen on rounds today.  Pt initially presented to the ED for complaints of Suicidal  Currently, the patient is is no acute distress. Denies any concerns at this time.  Physical Exam  Blood pressure 119/67, pulse 96, temperature 98.2 F (36.8 C), temperature source Oral, resp. rate 16, height 5\' 11"  (1.803 m), weight 55.3 kg, SpO2 98 %.  Physical Exam: General: No apparent distress Pulm: Normal WOB Neuro: Moving all extremities Psych: Resting comfortably     ED Course / MDM     I have reviewed the labs performed to date as well as medications administered while in observation.  Recent changes in the last 24 hours include: After evaluation today patient feels safe to go home.  They do not feel that he meets criteria for involuntary commitment.  Safe for discharge home with outpatient follow-up. Plan   Current plan: Patient awaiting psychiatric disposition. Patient is not under full IVC at this time.  Discharged in stable condition with outpatient follow-up and return precautions    , MD 08/18/22 08/20/22    [id number], MD 08/18/22 1022

## 2022-08-18 NOTE — Consult Note (Signed)
[facility] Face-to-Face Psychiatry Consult   Reason for Consult:Suicidal  Referring Physician: Dr. Elesa Massed Patient Identification: Vincent [name] MRN:  [id number] Principal Diagnosis: <principal problem not specified> Diagnosis:  Active Problems:   ADHD (attention deficit hyperactivity disorder), combined type   Anxiety   History of tobacco abuse   Severe recurrent major depression without psychotic features (HCC)   Suicidal ideation   Total Time spent with patient: 1 hour  Subjective: "My farther was in Hospice and he passed away a week and a half ago." Vincent [name] is a 56 y.o. male patient presented to [facility] ED via POV voluntarily. The patient is verbalizing passive suicidal ideation. The patient was recently admitted and discharged. The patient reports being on his medication and being compliant. The patient's UDS is remarkable for all prescribed medications and Cannabinoids.    This provider saw The patient face-to-face; the chart was reviewed, and Dr. Elesa Massed was consulted on 08/17/2022 due to the patient's care. It was discussed with both providers that the patient remained under observation overnight and will be reassessed in the a.m. to determine if he meets the criteria for psychiatric inpatient admission; he could be discharged home. The patient is very sleepy. He is slurring his words; he has taken his night medications.   Upon evaluation, the patient is alert and oriented x4, calm, cooperative, and mood-congruent with affect. The patient does not appear to be responding to internal or external stimuli. Neither is the patient presenting with any delusional thinking. The patient denies auditory or visual hallucinations. The patient admits to passive suicidal ideations but denies homicidal or self-harm ideations. The patient is not presenting with any psychotic or paranoid behaviors. During an encounter with the patient, he could answer questions appropriately.   HPI: Per Dr. Elesa Massed, Vincent [name] is a 56 y.o. male with history of depression, ADHD who presents to the emergency department with complaints of suicidal thoughts.  Recently lost his father a week and a half ago.  No HI or hallucinations but reports his mind has been racing "dark thoughts".  Has had recent psychiatric admission.   Past Psychiatric History:  ADHD (attention deficit hyperactivity disorder)  Risk to Self:   Risk to Others:   Prior Inpatient Therapy:   Prior Outpatient Therapy:    Past Medical History:  Past Medical History:  Diagnosis Date   ADHD (attention deficit hyperactivity disorder)    Asthma     Past Surgical History:  Procedure Laterality Date   CHOLECYSTECTOMY     COLON SURGERY     Family History:  Family History  Problem Relation Age of Onset   Diabetes Mother    Alzheimer's disease Mother    Depression Sister    Hypertension Father    Family Psychiatric  History:  Alzheimer's disease Mother   Depression Sister   Social History:  Social History   Substance and Sexual Activity  Alcohol Use Yes   Comment: special - occ     Social History   Substance and Sexual Activity  Drug Use No    Social History   Socioeconomic History   Marital status: Married    Spouse name: Not on file   Number of children: Not on file   Years of education: Not on file   Highest education level: Not on file  Occupational History   Not on file  Tobacco Use   Smoking status: Former    Packs/day: 1.00    Types: Cigarettes   Smokeless tobacco:  Never   Tobacco comments:    "I quit months ago"  Vaping Use   Vaping Use: Never used  Substance and Sexual Activity   Alcohol use: Yes    Comment: special - occ   Drug use: No   Sexual activity: Never  Other Topics Concern   Not on file  Social History Narrative   Not on file   Social Determinants of Health   Financial Resource Strain: Low Risk  (12/06/2017)   Overall Financial Resource Strain (CARDIA)    Difficulty of Paying Living  Expenses: Not hard at all  Food Insecurity: No Food Insecurity (06/30/2022)   Hunger Vital Sign    Worried About Running Out of Food in the Last Year: Never true    Ran Out of Food in the Last Year: Never true  Transportation Needs: No Transportation Needs (06/30/2022)   PRAPARE - Administrator, Civil Service (Medical): No    Lack of Transportation (Non-Medical): No  Physical Activity: Not on file  Stress: Not on file  Social Connections: Not on file   Additional Social History:    Allergies:  No Known Allergies  Labs:  Results for orders placed or performed during the hospital encounter of 08/17/22 (from the past 48 hour(s))  Comprehensive metabolic panel     Status: Abnormal   Collection Time: 08/17/22 10:37 PM  Result Value Ref Range   Sodium 145 135 - 145 mmol/L   Potassium 4.1 3.5 - 5.1 mmol/L   Chloride 105 98 - 111 mmol/L   CO2 35 (H) 22 - 32 mmol/L   Glucose, Bld 88 70 - 99 mg/dL    Comment: Glucose reference range applies only to samples taken after fasting for at least 8 hours.   BUN 9 6 - 20 mg/dL   Creatinine, Ser 1.61 0.61 - 1.24 mg/dL   Calcium 8.7 (L) 8.9 - 10.3 mg/dL   Total Protein 6.7 6.5 - 8.1 g/dL   Albumin 4.0 3.5 - 5.0 g/dL   AST 16 15 - 41 U/L   ALT 8 0 - 44 U/L   Alkaline Phosphatase 60 38 - 126 U/L   Total Bilirubin 0.5 0.3 - 1.2 mg/dL   GFR, Estimated >09 >60 mL/min    Comment: (NOTE) Calculated using the CKD-EPI Creatinine Equation (2021)    Anion gap 5 5 - 15    Comment: Performed at [facility], [address], [place], [place] [address]  Ethanol     Status: None   Collection Time: 08/17/22 10:37 PM  Result Value Ref Range   Alcohol, Ethyl (B) <10 <10 mg/dL    Comment: (NOTE) Lowest detectable limit for serum alcohol is 10 mg/dL.  For medical purposes only. Performed at [facility], [address] [place], [place] [address]   Salicylate level     Status: Abnormal   Collection Time: 08/17/22 10:37 PM   Result Value Ref Range   Salicylate Lvl <7.0 (L) 7.0 - 30.0 mg/dL    Comment: Performed at [facility], [address] [place], [place] [address]  Acetaminophen level     Status: Abnormal   Collection Time: 08/17/22 10:37 PM  Result Value Ref Range   Acetaminophen (Tylenol), Serum <10 (L) 10 - 30 ug/mL    Comment: (NOTE) Therapeutic concentrations vary significantly. A range of 10-30 ug/mL  may be an effective concentration for many patients. However, some  are best treated at concentrations outside of this range. Acetaminophen concentrations >  150 ug/mL at 4 hours after ingestion  and >50 ug/mL at 12 hours after ingestion are often associated with  toxic reactions.  Performed at [facility], [address] [place], [place] [address]   cbc     Status: None   Collection Time: 08/17/22 10:37 PM  Result Value Ref Range   WBC 6.4 4.0 - 10.5 K/uL   RBC 4.62 4.22 - 5.81 MIL/uL   Hemoglobin 14.6 13.0 - 17.0 g/dL   HCT 57.843.1 46.939.0 - 62.952.0 %   MCV 93.3 80.0 - 100.0 fL   MCH 31.6 26.0 - 34.0 pg   MCHC 33.9 30.0 - 36.0 g/dL   RDW 52.812.9 41.311.5 - 24.415.5 %   Platelets 324 150 - 400 K/uL   nRBC 0.0 0.0 - 0.2 %    Comment: Performed at [facility], [address] [place], [place] [address]  Urine Drug Screen, Qualitative     Status: Abnormal   Collection Time: 08/17/22 10:37 PM  Result Value Ref Range   Tricyclic, Ur Screen POSITIVE (A) NONE DETECTED   Amphetamines, Ur Screen POSITIVE (A) NONE DETECTED   MDMA (Ecstasy)Ur Screen NONE DETECTED NONE DETECTED   Cocaine Metabolite,Ur [place] NONE DETECTED NONE DETECTED   Opiate, Ur Screen NONE DETECTED NONE DETECTED   Phencyclidine (PCP) Ur S NONE DETECTED NONE DETECTED   Cannabinoid 50 Ng, Ur  POSITIVE (A) NONE DETECTED   Barbiturates, Ur Screen NONE DETECTED NONE DETECTED   Benzodiazepine, Ur Scrn POSITIVE (A) NONE DETECTED   Methadone Scn, Ur NONE DETECTED NONE DETECTED    Comment: (NOTE) Tricyclics +  metabolites, urine    Cutoff 1000 ng/mL Amphetamines + metabolites, urine  Cutoff 1000 ng/mL MDMA (Ecstasy), urine              Cutoff 500 ng/mL Cocaine Metabolite, urine          Cutoff 300 ng/mL Opiate + metabolites, urine        Cutoff 300 ng/mL Phencyclidine (PCP), urine         Cutoff 25 ng/mL Cannabinoid, urine                 Cutoff 50 ng/mL Barbiturates + metabolites, urine  Cutoff 200 ng/mL Benzodiazepine, urine              Cutoff 200 ng/mL Methadone, urine                   Cutoff 300 ng/mL  The urine drug screen provides only a preliminary, unconfirmed analytical test result and should not be used for non-medical purposes. Clinical consideration and professional judgment should be applied to any positive drug screen result due to possible interfering substances. A more specific alternate chemical method must be used in order to obtain a confirmed analytical result. Gas chromatography / mass spectrometry (GC/MS) is the preferred confirm atory method. Performed at [facility], [address] [place], [place] [address]   SARS Coronavirus 2 by RT PCR (hospital order, performed in [facility] hospital lab) *cepheid single result test* Anterior Nasal Swab     Status: None   Collection Time: 08/17/22 10:49 PM   Specimen: Anterior Nasal Swab  Result Value Ref Range   SARS Coronavirus 2 by RT PCR NEGATIVE NEGATIVE    Comment: (NOTE) SARS-CoV-2 target nucleic acids are NOT DETECTED.  The SARS-CoV-2 RNA is generally detectable in upper and lower respiratory specimens during the acute phase of infection. The lowest concentration of SARS-CoV-2 viral  copies this assay can detect is 250 copies / mL. A negative result does not preclude SARS-CoV-2 infection and should not be used as the sole basis for treatment or other patient management decisions.  A negative result may occur with improper specimen collection / handling, submission of specimen other than nasopharyngeal  swab, presence of viral mutation(s) within the areas targeted by this assay, and inadequate number of viral copies (<250 copies / mL). A negative result must be combined with clinical observations, patient history, and epidemiological information.  Fact Sheet for Patients:   RoadLapTop.co.za  Fact Sheet for Healthcare Providers: http://kim-miller.com/  This test is not yet approved or  cleared by the [place] FDA and has been authorized for detection and/or diagnosis of SARS-CoV-2 by FDA under an Emergency Use Authorization (EUA).  This EUA will remain in effect (meaning this test can be used) for the duration of the COVID-19 declaration under Section 564(b)(1) of the Act, 21 U.S.C. section 360bbb-3(b)(1), unless the authorization is terminated or revoked sooner.  Performed at [facility], [address] [place], [place] [address]     Current Facility-Administered Medications  Medication Dose Route Frequency Provider Last Rate Last Admin   amphetamine-dextroamphetamine (ADDERALL XR) 24 hr capsule 40 mg  40 mg Oral Daily Ward, Kristen N, DO       clonazePAM (KLONOPIN) tablet 0.5 mg  0.5 mg Oral BID Ward, Kristen N, DO   0.5 mg at 08/17/22 2326   escitalopram (LEXAPRO) tablet 15 mg  15 mg Oral Daily Ward, Kristen N, DO       QUEtiapine (SEROQUEL) tablet 100 mg  100 mg Oral QHS Ward, Kristen N, DO   100 mg at 08/17/22 2326   traZODone (DESYREL) tablet 100 mg  100 mg Oral QHS PRN Ward, Layla Maw, DO       Current Outpatient Medications  Medication Sig Dispense Refill   amphetamine-dextroamphetamine (ADDERALL XR) 20 MG 24 hr capsule Take 2 capsules (40 mg total) by mouth daily. 60 capsule 0   clonazePAM (KLONOPIN) 0.5 MG tablet Take 1 tablet (0.5 mg total) by mouth 2 (two) times daily. 60 tablet 2   escitalopram (LEXAPRO) 10 MG tablet Take 1.5 tablets (15 mg total) by mouth daily. 45 tablet 2   nicotine (NICODERM CQ - DOSED  IN MG/24 HOURS) 21 mg/24hr patch Place 1 patch (21 mg total) onto the skin daily. 28 patch 0   QUEtiapine (SEROQUEL) 100 MG tablet Take 1 tablet (100 mg total) by mouth at bedtime. 30 tablet 2   traZODone (DESYREL) 100 MG tablet Take 1 tablet (100 mg total) by mouth at bedtime as needed for sleep. 30 tablet 2    Musculoskeletal: Strength & Muscle Tone: within normal limits Gait & Station: normal Patient leans: N/A Psychiatric Specialty Exam:  Presentation  General Appearance:  Bizarre; Disheveled  Eye Contact: Fair  Speech: Slow; Slurred  Speech Volume: Decreased  Handedness: Right   Mood and Affect  Mood: Depressed  Affect: Constricted; Depressed; Inappropriate   Thought Process  Thought Processes: Coherent  Descriptions of Associations:Loose  Orientation:Full (Time, Place and Person)  Thought Content:Scattered  History of Schizophrenia/Schizoaffective disorder:No data recorded Duration of Psychotic Symptoms:No data recorded Hallucinations:Hallucinations: None  Ideas of Reference:None  Suicidal Thoughts:Suicidal Thoughts: Yes, Passive SI Passive Intent and/or Plan: Without Plan  Homicidal Thoughts:Homicidal Thoughts: No   Sensorium  Memory: Immediate Fair; Recent Fair; Remote Fair  Judgment: Fair  Insight: Fair   Art therapist  Concentration: Fair  Attention  Span: Fair  Recall: [employer] of Knowledge: Fair  Language: Fair   Psychomotor Activity  Psychomotor Activity: Psychomotor Activity: Normal   Assets  Assets: Manufacturing systems engineer; Desire for Improvement; Physical Health; Resilience; Social Support   Sleep  Sleep: Sleep: Good Number of Hours of Sleep: 8   Physical Exam: Physical Exam Vitals and nursing note reviewed.  Constitutional:      Appearance: He is ill-appearing.  HENT:     Head: Normocephalic and atraumatic.     Right Ear: External ear normal.     Left Ear: External ear normal.     Nose:  Nose normal.     Mouth/Throat:     Mouth: Mucous membranes are moist.  Cardiovascular:     Rate and Rhythm: Normal rate.     Pulses: Normal pulses.  Pulmonary:     Effort: Pulmonary effort is normal.  Musculoskeletal:        General: Normal range of motion.     Cervical back: Normal range of motion and neck supple.  Neurological:     General: No focal deficit present.     Mental Status: He is alert and oriented to person, place, and time.  Psychiatric:        Attention and Perception: Attention and perception normal.        Mood and Affect: Mood is depressed. Affect is blunt, flat and inappropriate.        Speech: Speech is slurred.        Behavior: Behavior normal.        Thought Content: Thought content normal.        Cognition and Memory: Cognition and memory normal.        Judgment: Judgment is inappropriate.    Review of Systems  Psychiatric/Behavioral:  Positive for substance abuse and suicidal ideas.   All other systems reviewed and are negative.  Blood pressure 119/67, pulse 96, temperature 98.2 F (36.8 C), temperature source Oral, resp. rate 16, height 5\' 11"  (1.803 m), weight 55.3 kg, SpO2 98 %. Body mass index is 17.02 kg/m.  Treatment Plan Summary: Plan   The patient remained under observation overnight and will be reassessed in the a.m. to determine if he meets the criteria for psychiatric inpatient admission; he could be discharged home. The patient is very sleepy. He is slurring his words he has taken his night medications.   Disposition: Supportive therapy provided about ongoing stressors. The patient remained under observation overnight and will be reassessed in the a.m. to determine if he meets the criteria for psychiatric inpatient admission; he could be discharged home. The patient is very sleepy. He is slurring his words he has taken his night medications.   , NP 08/18/2022 12:23 AM

## 2022-08-18 NOTE — ED Notes (Signed)
Pt moved to [facility] room 3. Report was given to Beverely Pace RN. Pt remains calm and cooperative with staff. Security wannded pt prior to departure from hallway bed. Pt states he feels sleepy after night time medications (see MAR). Was wheelchair to [facility].

## 2022-08-18 NOTE — BH Assessment (Signed)
Comprehensive Clinical Assessment (CCA) Note  08/18/2022 Vincent [name] [id number]  Chief Complaint:  Chief Complaint  Patient presents with   Suicidal   Visit Diagnosis: Depression   Vincent [name] is a 56 year old male who presents to the ER due to having passive SI. The patient's father passed approximately a week ago. He reports of no longer having any thoughts of ending his life and that he wants to go home. When asked what has changed from the time he arrived to now, he say he states, he had his medications and some sleep.  During the interview, the patient was calm, cooperative and pleasant. He was able to provide appropriate answers to the questions. Throughout the interview, he denied SI/HI and AV/H.   CCA Screening, Triage and Referral (STR)  Patient Reported Information How did you hear about [place]? Self  What Is the Reason for Your Visit/Call Today? Came to the ER due to having passive SI  How Long Has This Been Causing You Problems? <Week  What Do You Feel Would Help You the Most Today? Treatment for Depression or other mood problem   Have You Recently Had Any Thoughts About Hurting Yourself? No  Are You Planning to Commit Suicide/Harm Yourself At This time? No   Flowsheet Row ED from 08/17/2022 in [place] Most recent reading at 08/18/2022  8:53 AM Admission (Discharged) from 06/30/2022 in [place] Most recent reading at 07/04/2022  3:06 PM ED from 06/30/2022 in [place] Most recent reading at 06/30/2022  9:27 AM  C-SSRS RISK CATEGORY Error: Q3, 4, or 5 should not be populated when Q2 is No Low Risk Low Risk       Have you Recently Had Thoughts About [place]? No  Are You Planning to Harm Someone at This Time? No  Explanation: No data recorded  Have You Used Any Alcohol or Drugs in the Past 24 Hours? No  What Did You Use and How  Much? No data recorded  Do You Currently Have a Therapist/Psychiatrist? No  Name of Therapist/Psychiatrist:    Have You Been Recently Discharged From Any Office Practice or Programs? No  Explanation of Discharge From Practice/Program: No data recorded    CCA Screening Triage Referral Assessment Type of Contact: Face-to-Face  Telemedicine Service Delivery:   Is this Initial or Reassessment?   Date Telepsych consult ordered in [facility]:    Time Telepsych consult ordered in [facility]:    Location of Assessment: [facility] ED  Provider Location: [facility] ED  Collateral Involvement: No data recorded  Does Patient Have a [place]? No  Legal Guardian Contact Information: No data recorded Copy of Legal Guardianship Form: No data recorded Legal Guardian Notified of Arrival: No data recorded Legal Guardian Notified of Pending Discharge: No data recorded If Minor and Not Living with Parent(s), Who has Custody? No data recorded Is CPS involved or ever been involved? Never  Is APS involved or ever been involved? Never   Patient Determined To Be At Risk for Harm To Self or Others Based on Review of Patient Reported Information or Presenting Complaint? No  Method: No data recorded Availability of Means: No data recorded Intent: No data recorded Notification Required: No data recorded Additional Information for Danger to Others Potential: No data recorded Additional Comments for Danger to Others Potential: No data recorded Are There Guns or Other Weapons in Your Home? No data recorded Types of Guns/Weapons:  No data recorded Are These Weapons Safely Secured?                            No data recorded Who Could Verify You Are Able To Have These Secured: No data recorded Do You Have any Outstanding Charges, Pending Court Dates, Parole/Probation? No data recorded Contacted To Inform of Risk of Harm To Self or Others: No data recorded   Does Patient Present under Involuntary  Commitment? No    [place] of Residence: [place]   Patient Currently Receiving the Following Services: Not Receiving Services   Determination of Need: Emergent (2 hours)   Options For Referral: ED Referral     CCA Biopsychosocial Patient Reported Schizophrenia/Schizoaffective Diagnosis in Past: No   Strengths: Some insight, stable housing and seeking help.   Mental Health Symptoms Depression:   Hopelessness   Duration of Depressive symptoms:  Duration of Depressive Symptoms: Less than two weeks   Mania:   N/A   Anxiety:    N/A   Psychosis:   None   Duration of Psychotic symptoms:    Trauma:   N/A   Obsessions:   N/A   Compulsions:   N/A   Inattention:   N/A   Hyperactivity/Impulsivity:   N/A   Oppositional/Defiant Behaviors:   N/A   Emotional Irregularity:   N/A   Other Mood/Personality Symptoms:  No data recorded   Mental Status Exam Appearance and self-care  Stature:   Average   Weight:   Average weight   Clothing:   Neat/clean   Grooming:   Normal   Cosmetic use:   None   Posture/gait:   Normal   Motor activity:   -- (within normal range)   Sensorium  Attention:   Normal   Concentration:   Normal   Orientation:   X5   Recall/memory:   Normal   Affect and Mood  Affect:   Appropriate   Mood:   Hopeless; Depressed   Relating  Eye contact:   Normal   Facial expression:   Depressed   Attitude toward examiner:   Cooperative   Thought and Language  Speech flow:  Clear and Coherent   Thought content:   Appropriate to Mood and Circumstances   Preoccupation:   None   Hallucinations:   None   Organization:   Coherent   [employer] of Knowledge:   Fair   Intelligence:   Average   Abstraction:   Normal   Judgement:   Fair   Art therapist:   Adequate   Insight:   Fair   Decision Making:   Normal   Social Functioning  Social Maturity:   Responsible    Social Judgement:   Normal; "Street Smart"   Stress  Stressors:   Other (Comment) (Father passed approximately a week ago.)   Coping Ability:   Normal   Skill Deficits:   None   Supports:   Friends/Service system     Religion: Religion/Spirituality Are You A Religious Person?: No  Leisure/Recreation: Leisure / Recreation Do You Have Hobbies?: No  Exercise/Diet: Exercise/Diet Do You Exercise?: No Have You Gained or Lost A Significant Amount of Weight in the Past Six Months?: No Do You Follow a Special Diet?: No Do You Have Any Trouble Sleeping?: No   CCA Employment/Education Employment/Work Situation: Employment / Work Situation Employment Situation: Unemployed Patient's Job has Been Impacted by Current Illness: No  Education: Education Is  Patient Currently Attending School?: No Did You Have An Individualized Education Program (IIEP): No Did You Have Any Difficulty At School?: No Patient's Education Has Been Impacted by Current Illness: No   CCA Family/Childhood History Family and Relationship History: Family history Marital status: Single Does patient have children?: No  Childhood History:  Childhood History By whom was/is the patient raised?: Both parents Did patient suffer any verbal/emotional/physical/sexual abuse as a child?: No Did patient suffer from severe childhood neglect?: No Has patient ever been sexually abused/assaulted/raped as an adolescent or adult?: No Was the patient ever a victim of a crime or a disaster?: No Witnessed domestic violence?: No Has patient been affected by domestic violence as an adult?: No       CCA Substance Use Alcohol/Drug Use: Alcohol / Drug Use Pain Medications: See MAR Prescriptions: See MAR Over the Counter: See MAR History of alcohol / drug use?: Yes Longest period of sobriety (when/how long): Unable to quantify Substance #1 Name of Substance 1: Cannabis     ASAM's:  Six Dimensions of  Multidimensional Assessment  Dimension 1:  Acute Intoxication and/or Withdrawal Potential:      Dimension 2:  Biomedical Conditions and Complications:      Dimension 3:  Emotional, Behavioral, or Cognitive Conditions and Complications:     Dimension 4:  Readiness to Change:     Dimension 5:  Relapse, Continued use, or Continued Problem Potential:     Dimension 6:  Recovery/Living Environment:     ASAM Severity Score:    ASAM Recommended Level of Treatment:     Substance use Disorder (SUD)    Recommendations for Services/Supports/Treatments:    Discharge Disposition:    DSM5 Diagnoses: Patient Active Problem List   Diagnosis Date Noted   Moderate episode of recurrent major depressive disorder (HCC) 08/18/2022   Severe recurrent major depression without psychotic features (HCC) 06/30/2022   Suicidal ideation 06/30/2022   Small bowel obstruction (HCC) 01/09/2022   Abdominal pain 01/08/2022   Abnormal CT of the abdomen 01/08/2022   SBO (small bowel obstruction) (HCC) 01/08/2022   Elevated LFTs 01/08/2022   History of tobacco abuse 01/08/2022   Dysthymia 04/22/2015   Anxiety 03/25/2015   ADHD (attention deficit hyperactivity disorder), combined type 02/25/2015     Referrals to Alternative Service(s): Referred to Alternative Service(s):   Place:   Date:   Time:    Referred to Alternative Service(s):   Place:   Date:   Time:    Referred to Alternative Service(s):   Place:   Date:   Time:    Referred to Alternative Service(s):   Place:   Date:   Time:     Lilyan Gilford MS, LCAS, [facility], [facility] Therapeutic Triage Specialist 08/18/2022 9:40 AM

## 2022-08-28 ENCOUNTER — Telehealth (INDEPENDENT_AMBULATORY_CARE_PROVIDER_SITE_OTHER): Payer: Self-pay | Admitting: Psychiatry

## 2022-08-28 DIAGNOSIS — F4323 Adjustment disorder with mixed anxiety and depressed mood: Secondary | ICD-10-CM

## 2022-08-28 DIAGNOSIS — F902 Attention-deficit hyperactivity disorder, combined type: Secondary | ICD-10-CM

## 2022-08-28 DIAGNOSIS — F331 Major depressive disorder, recurrent, moderate: Secondary | ICD-10-CM

## 2022-08-28 MED ORDER — AMPHETAMINE-DEXTROAMPHET ER 20 MG PO CP24: 40.0000 mg | ORAL_CAPSULE | Freq: Every day | ORAL | 0 refills | Status: DC

## 2022-08-28 NOTE — Progress Notes (Signed)
Virtual Visit via Telephone Note  I connected with Vincent [name] on 08/28/22 at  1:00 PM EST by telephone and verified that I am speaking with the correct person using two identifiers.  Location: Patient: Home Provider: Hospital   I discussed the limitations, risks, security and privacy concerns of performing an evaluation and management service by telephone and the availability of in person appointments. I also discussed with the patient that there may be a patient responsible charge related to this service. The patient expressed understanding and agreed to proceed.   History of Present Illness: Patient reached by telephone.  We were late because of scheduling problems but he had time to talk.  Patient's father passed away since we last spoke.  We talked about this he had a lot to say.  Overall normal grief but doing well.  Sleeping okay.  Taking medicine regularly.  No suicidal thoughts no evidence psychosis.    Observations/Objective: Alert and engaged appropriately engaged.  Denies suicidal thoughts.  Tolerating medication.  No return of any suicidal thinking or severe depression   Assessment and Plan: Renewed Adderall other medicines have prescriptions that still have refills follow-up in a month.  Supportive counseling and therapy around grief.   Follow Up Instructions:    I discussed the assessment and treatment plan with the patient. The patient was provided an opportunity to ask questions and all were answered. The patient agreed with the plan and demonstrated an understanding of the instructions.   The patient was advised to call back or seek an in-person evaluation if the symptoms worsen or if the condition fails to improve as anticipated.  I provided 20 minutes of non-face-to-face time during this encounter.   [name], MD

## 2022-10-03 ENCOUNTER — Telehealth (INDEPENDENT_AMBULATORY_CARE_PROVIDER_SITE_OTHER): Payer: Self-pay | Admitting: Psychiatry

## 2022-10-03 DIAGNOSIS — F902 Attention-deficit hyperactivity disorder, combined type: Secondary | ICD-10-CM

## 2022-10-03 DIAGNOSIS — F331 Major depressive disorder, recurrent, moderate: Secondary | ICD-10-CM

## 2022-10-03 MED ORDER — CLONAZEPAM 0.5 MG PO TABS: 0.5000 mg | ORAL_TABLET | Freq: Two times a day (BID) | ORAL | 2 refills | Status: DC

## 2022-10-03 MED ORDER — TRAZODONE HCL 100 MG PO TABS: 100.0000 mg | ORAL_TABLET | Freq: Every evening | ORAL | 2 refills | Status: DC | PRN

## 2022-10-03 MED ORDER — QUETIAPINE FUMARATE 100 MG PO TABS: 100.0000 mg | ORAL_TABLET | Freq: Every day | ORAL | 2 refills | Status: DC

## 2022-10-03 MED ORDER — AMPHETAMINE-DEXTROAMPHET ER 20 MG PO CP24: 40.0000 mg | ORAL_CAPSULE | Freq: Every day | ORAL | 0 refills | Status: DC

## 2022-10-03 MED ORDER — ESCITALOPRAM OXALATE 10 MG PO TABS: 15.0000 mg | ORAL_TABLET | Freq: Every day | ORAL | 2 refills | Status: DC

## 2022-10-03 NOTE — Progress Notes (Signed)
Virtual Visit via Telephone Note  I connected with Vincent [name] on 10/03/22 at  1:00 PM EST by telephone and verified that I am speaking with the correct person using two identifiers.  Location: Patient: Home Provider: Hospital   I discussed the limitations, risks, security and privacy concerns of performing an evaluation and management service by telephone and the availability of in person appointments. I also discussed with the patient that there may be a patient responsible charge related to this service. The patient expressed understanding and agreed to proceed.   History of Present Illness: Patient reached by telephone.  No new complaint.  Still out of work but says that he is trying to stay positive.  Denies any suicidal thoughts.  Denies any psychotic symptoms.   Observations/Objective: Alert and oriented.  Lucid.  Euthymic affect.   Assessment and Plan: Reviewed medications.  Reviewed the importance of self-care and health.  Reviewed side effects.  Follow-up in a month.   Follow Up Instructions:    I discussed the assessment and treatment plan with the patient. The patient was provided an opportunity to ask questions and all were answered. The patient agreed with the plan and demonstrated an understanding of the instructions.   The patient was advised to call back or seek an in-person evaluation if the symptoms worsen or if the condition fails to improve as anticipated.  I provided 10 minutes of non-face-to-face time during this encounter.   Alethia Berthold, MD

## 2022-11-02 ENCOUNTER — Telehealth (INDEPENDENT_AMBULATORY_CARE_PROVIDER_SITE_OTHER): Payer: Self-pay | Admitting: Psychiatry

## 2022-11-02 DIAGNOSIS — F902 Attention-deficit hyperactivity disorder, combined type: Secondary | ICD-10-CM

## 2022-11-02 DIAGNOSIS — F331 Major depressive disorder, recurrent, moderate: Secondary | ICD-10-CM

## 2022-11-02 MED ORDER — AMPHETAMINE-DEXTROAMPHET ER 20 MG PO CP24: 40.0000 mg | ORAL_CAPSULE | Freq: Every day | ORAL | 0 refills | Status: DC

## 2022-11-02 NOTE — Progress Notes (Signed)
Virtual Visit via Telephone Note  I connected with Vincent [name] on 11/02/22 at  1:00 PM EST by telephone and verified that I am speaking with the correct person using two identifiers.  Location: Patient: Home Provider: Hospital   I discussed the limitations, risks, security and privacy concerns of performing an evaluation and management service by telephone and the availability of in person appointments. I also discussed with the patient that there may be a patient responsible charge related to this service. The patient expressed understanding and agreed to proceed.   History of Present Illness: Patient reached by telephone.  Patient is still not working.  Spends part of every day applying for work.  Also has developed some other ideas about things he may do with his time.  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  Medicines continued to feel effective.  No depression.  Some concern that he had a spell in the past month where for about 3 days he slept very little.  It sounds like during that time he was feeling very good but not psychotic.  No negative behavior.  He did have an awareness that this could possibly be a sign of a manic episode.  Currently not at all suicidal psychotic or dangerous.    Observations/Objective: Affect euthymic.  Speech and behavior and affect about the same as usual.  No suicidal thought no homicidal thought and no psychotic thinking no other side effects from medicine.  Not sleeping does not seem to have been from the medicine as he is on the same chronic dose he has been on for quite a while   Assessment and Plan: Reviewed signs of mania and encouraged him to get in touch with [place] sooner if this should happen again.  No change to medicines refilled and will follow up in a month  Follow Up Instructions:    I discussed the assessment and treatment plan with the patient. The patient was provided an opportunity to ask questions and all were  answered. The patient agreed with the plan and demonstrated an understanding of the instructions.   The patient was advised to call back or seek an in-person evaluation if the symptoms worsen or if the condition fails to improve as anticipated.  I provided 20 minutes of non-face-to-face time during this encounter.   Alethia Berthold, MD

## 2022-11-30 ENCOUNTER — Telehealth (INDEPENDENT_AMBULATORY_CARE_PROVIDER_SITE_OTHER): Payer: Self-pay | Admitting: Psychiatry

## 2022-11-30 DIAGNOSIS — F902 Attention-deficit hyperactivity disorder, combined type: Secondary | ICD-10-CM

## 2022-11-30 DIAGNOSIS — F331 Major depressive disorder, recurrent, moderate: Secondary | ICD-10-CM

## 2022-11-30 MED ORDER — TRAZODONE HCL 100 MG PO TABS: 100.0000 mg | ORAL_TABLET | Freq: Every evening | ORAL | 2 refills | Status: AC | PRN

## 2022-11-30 MED ORDER — ESCITALOPRAM OXALATE 10 MG PO TABS: 15.0000 mg | ORAL_TABLET | Freq: Every day | ORAL | 2 refills | Status: DC

## 2022-11-30 MED ORDER — CLONAZEPAM 0.5 MG PO TABS: 0.5000 mg | ORAL_TABLET | Freq: Two times a day (BID) | ORAL | 2 refills | Status: AC

## 2022-11-30 MED ORDER — QUETIAPINE FUMARATE 100 MG PO TABS: 100.0000 mg | ORAL_TABLET | Freq: Every day | ORAL | 2 refills | Status: DC

## 2022-11-30 MED ORDER — AMPHETAMINE-DEXTROAMPHET ER 20 MG PO CP24: 40.0000 mg | ORAL_CAPSULE | Freq: Every day | ORAL | 0 refills | Status: DC

## 2022-11-30 NOTE — Progress Notes (Signed)
Virtual Visit via Telephone Note  I connected with Vincent [name] on 11/30/22 at  1:00 PM EST by telephone and verified that I am speaking with the correct person using two identifiers.  Location: Patient: Home Provider: Hospital   I discussed the limitations, risks, security and privacy concerns of performing an evaluation and management service by telephone and the availability of in person appointments. I also discussed with the patient that there may be a patient responsible charge related to this service. The patient expressed understanding and agreed to proceed.   History of Present Illness: Patient reached by telephone.  He is having a cold today and was sneezing throughout but otherwise reports that his mood has been stable attention has been stable.  No behavior changes.  Still looking for work but not despairing.  No suicidal ideation.    Observations/Objective: Coughing and sneezing but otherwise affect euthymic mood stable thoughts lucid no suicidal or dangerous ideation   Assessment and Plan: Supportive counseling around his job search.  Continue medicines all of which need refill and talk again in a month   Follow Up Instructions:    I discussed the assessment and treatment plan with the patient. The patient was provided an opportunity to ask questions and all were answered. The patient agreed with the plan and demonstrated an understanding of the instructions.   The patient was advised to call back or seek an in-person evaluation if the symptoms worsen or if the condition fails to improve as anticipated.  I provided 20 minutes of non-face-to-face time during this encounter.   Alethia Berthold, MD

## 2023-01-11 ENCOUNTER — Telehealth (INDEPENDENT_AMBULATORY_CARE_PROVIDER_SITE_OTHER): Payer: 59 | Admitting: Psychiatry

## 2023-01-11 DIAGNOSIS — F341 Dysthymic disorder: Secondary | ICD-10-CM | POA: Diagnosis not present

## 2023-01-11 DIAGNOSIS — F902 Attention-deficit hyperactivity disorder, combined type: Secondary | ICD-10-CM | POA: Diagnosis not present

## 2023-01-11 DIAGNOSIS — F331 Major depressive disorder, recurrent, moderate: Secondary | ICD-10-CM

## 2023-01-11 MED ORDER — AMPHETAMINE-DEXTROAMPHET ER 20 MG PO CP24: 40.0000 mg | ORAL_CAPSULE | Freq: Every day | ORAL | 0 refills | Status: AC

## 2023-01-11 NOTE — Progress Notes (Signed)
Virtual Visit via Telephone Note  I connected with [name] on 01/11/23 at  1:00 PM EDT by telephone and verified that I am speaking with the correct person using two identifiers.  Location: Patient: Home Provider: Hospital   I discussed the limitations, risks, security and privacy concerns of performing an evaluation and management service by telephone and the availability of in person appointments. I also discussed with the patient that there may be a patient responsible charge related to this service. The patient expressed understanding and agreed to proceed.   History of Present Illness: Patient patient reached by telephone.  Patient was ready for the appointment was appropriate and prepared.  Generally feeling good.  Of course still frustrated because he has not been able to find work.  At times gets discouraged but says recently he has been feeling more optimistic.  Mood is not bad.  No suicidal thoughts no psychosis no spells of agitation.  Most of the time sleeping well.  Tolerating medicine well.    Observations/Objective: No sign of disorganized thinking or looseness.  No rage no suicidal thoughts.   Assessment and Plan: Patient is aware that I will be leaving the practice.  He is aware that the office is working on referrals to other providers.  I have refilled all of his medicines for now and would be happy to speak to him again next month after which she can switch over.   Follow Up Instructions:    I discussed the assessment and treatment plan with the patient. The patient was provided an opportunity to ask questions and all were answered. The patient agreed with the plan and demonstrated an understanding of the instructions.   The patient was advised to call back or seek an in-person evaluation if the symptoms worsen or if the condition fails to improve as anticipated.  I provided 20 minutes of non-face-to-face time during this encounter.   Vincent Rasmussen, MD

## 2023-03-06 ENCOUNTER — Ambulatory Visit: Payer: 59 | Admitting: Psychiatry

## 2023-03-10 ENCOUNTER — Encounter: Payer: Self-pay | Admitting: Emergency Medicine

## 2023-03-10 ENCOUNTER — Emergency Department: Payer: Self-pay

## 2023-03-10 ENCOUNTER — Emergency Department
Admission: EM | Admit: 2023-03-10 | Discharge: 2023-03-12 | Discharge status: Against Medical Advice | Payer: Self-pay | Attending: Emergency Medicine | Admitting: Emergency Medicine

## 2023-03-10 DIAGNOSIS — Z5321 Procedure and treatment not carried out due to patient leaving prior to being seen by health care provider: Secondary | ICD-10-CM | POA: Insufficient documentation

## 2023-03-10 DIAGNOSIS — J45901 Unspecified asthma with (acute) exacerbation: Secondary | ICD-10-CM | POA: Insufficient documentation

## 2023-03-10 LAB — BASIC METABOLIC PANEL
Anion gap: 7 (ref 5–15)
BUN: 13 mg/dL (ref 6–20)
CO2: 25 mmol/L (ref 22–32)
Calcium: 8.7 mg/dL — ABNORMAL LOW (ref 8.9–10.3)
Chloride: 108 mmol/L (ref 98–111)
Creatinine, Ser: 0.76 mg/dL (ref 0.61–1.24)
GFR, Estimated: >60 mL/min (ref >60)
Glucose, Bld: 86 mg/dL (ref 70–99)
Potassium: 3.9 mmol/L (ref 3.5–5.1)
Sodium: 140 mmol/L (ref 135–145)

## 2023-03-10 LAB — TROPONIN I (HIGH SENSITIVITY): Troponin I (High Sensitivity): 3 ng/L (ref <18)

## 2023-03-10 LAB — CBC
HCT: 40 % (ref 39.0–52.0)
Hemoglobin: 13.5 g/dL (ref 13.0–17.0)
MCH: 31.5 pg (ref 26.0–34.0)
MCHC: 33.8 g/dL (ref 30.0–36.0)
MCV: 93.5 fL (ref 80.0–100.0)
Platelets: 244 10*3/uL (ref 150–400)
RBC: 4.28 MIL/uL (ref 4.22–5.81)
RDW: 13.2 % (ref 11.5–15.5)
WBC: 6.8 10*3/uL (ref 4.0–10.5)
nRBC: 0 % (ref 0.0–0.2)

## 2023-03-10 MED ORDER — IPRATROPIUM-ALBUTEROL 0.5-2.5 (3) MG/3ML IN SOLN: 3.0000 mL | Freq: Once | RESPIRATORY_TRACT | Status: AC

## 2023-03-10 MED ORDER — IPRATROPIUM-ALBUTEROL 0.5-2.5 (3) MG/3ML IN SOLN
RESPIRATORY_TRACT | Status: AC
  Administered 2023-03-10: 3 mL via RESPIRATORY_TRACT
  Filled 2023-03-10: qty 3

## 2023-03-10 NOTE — ED Notes (Signed)
Pt made comments to staff earlier that he was going to leave, pt called for room-no answer, called cell phone as well no answer,

## 2023-03-10 NOTE — ED Triage Notes (Signed)
Pt denies pain but complains of chest pressure like someone is hugging him tight and he can't get a breath

## 2023-03-10 NOTE — ED Triage Notes (Signed)
Pt complains of SOB and wheezing and asthma exacerbation. Pt ran out of inhaler and neb treatments at home.

## 2023-06-09 IMAGING — MR MR ABDOMEN WO/W CM MRCP
17 of 19 series · 44 of 48 positions shown · IV contrast (gadavist)
Comparison: Ultrasound exam 01/09/2022.  CT scan 01/08/2022.

CLINICAL DATA: Biliary ductal dilatation.



[Series 4: T2 · coronal · 6.0mm · 1.19mm/px · 1 of 30 slices shown (1 of 2)]
[im 1/30]
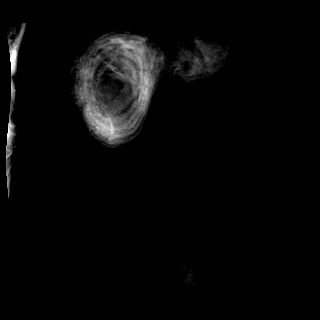

[Series 5: T2 · axial · 6.0mm · 1.19mm/px · 1 of 32 slices shown (2 of 2)]
[im 1/32]
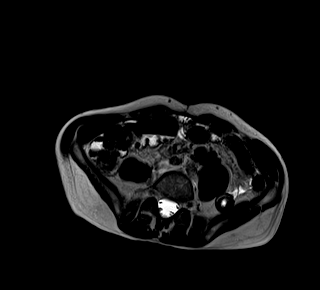

[Series 6: in & out · axial · 3.0mm · 1.19mm/px · z∈[-174,+39]mm · 2 of 72 slices shown (1 of 2)]
[im 1/72]
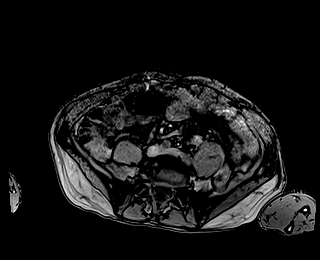
[im 72/72]
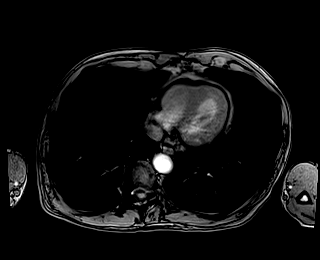

[Series 7: in & out · axial · 3.0mm · 1.19mm/px · z∈[-174,+39]mm · 2 of 72 slices shown (2 of 2)]
[im 1/72]
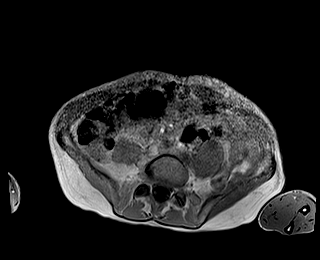
[im 72/72]
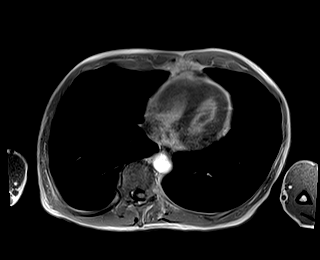

[Series 10: T2 fat-sat · axial · 6.0mm · 1.19mm/px · 1 of 30 slices shown]
[im 1/30]
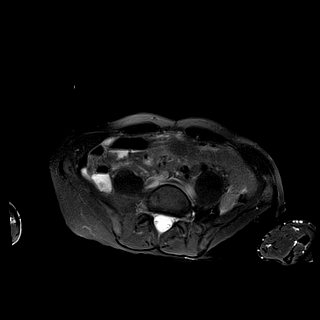

[Series 11: ax dwi_tracew · axial · 6.0mm · 1.42mm/px · z∈[-172,+37]mm · 3 of 90 slices shown]
[im 1/90]
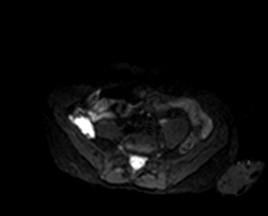
[im 45/90]
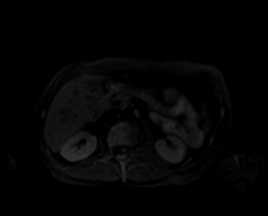
[im 90/90]
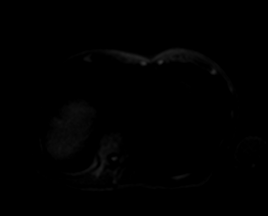

[Series 12: ax dwi_adc · axial · 6.0mm · 1.42mm/px · 1 of 30 slices shown]
[im 1/30]
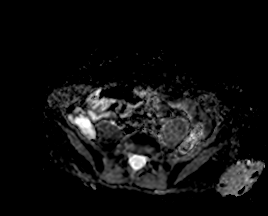

[Series 16: MRCP · coronal · 3.0mm · 1.12mm/px · 1 of 17 slices shown]
[im 1/17]
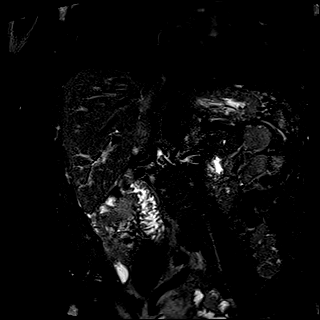

[Series 17: radials · coronal · 50.0mm · 0.78mm/px · 1 of 5 slices shown]
[im 1/5]
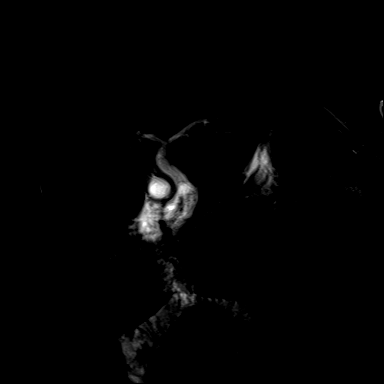

[Series 18: T1 dynamic fat-sat · axial · non-contrast · 3.0mm · 1.19mm/px · z∈[-186,+51]mm · 4 of 80 slices shown (1 of 4)]
[im 1/80]
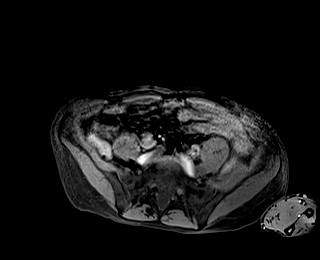
[im 27/80]
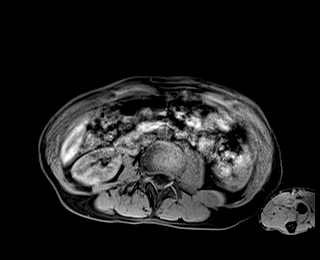
[im 53/80]
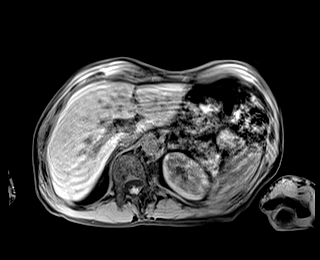
[im 80/80]
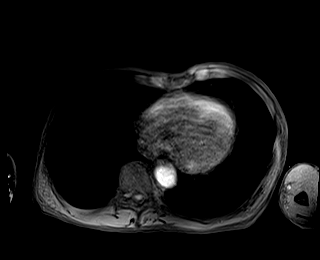

[Series 21: T1 dynamic fat-sat post-contrast · axial · 3.0mm · 1.19mm/px · z∈[-186,+51]mm · 4 of 80 slices shown (1 of 3)]
[im 1/80]
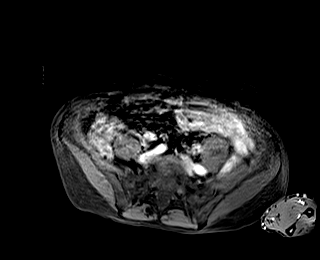
[im 27/80]
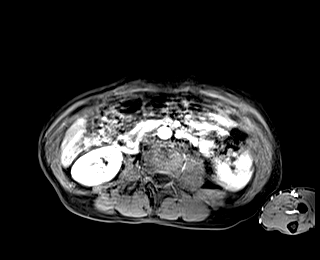
[im 53/80]
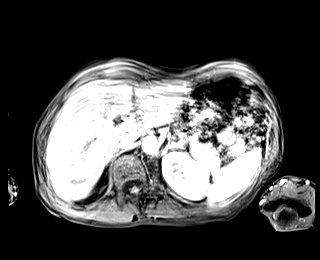
[im 80/80]
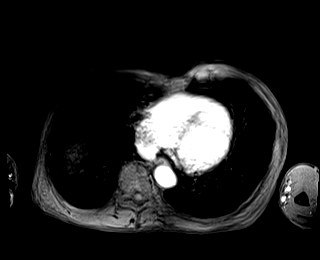

[Series 22: T1 dynamic fat-sat · axial · 3.0mm · 1.19mm/px · z∈[-186,+51]mm · 4 of 80 slices shown (2 of 4)]
[im 1/80]
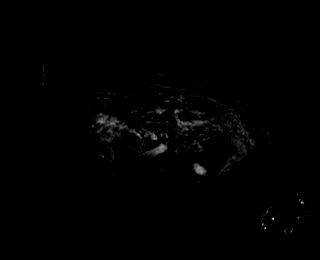
[im 27/80]
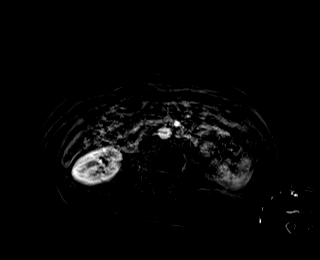
[im 53/80]
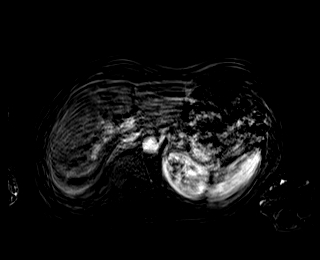
[im 80/80]
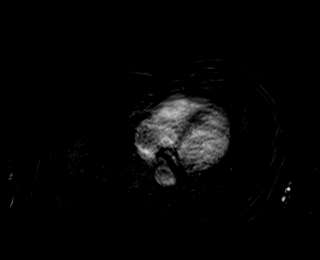

[Series 23: T1 dynamic fat-sat post-contrast · axial · 3.0mm · 1.19mm/px · z∈[-186,+51]mm · 4 of 80 slices shown (2 of 3)]
[im 1/80]
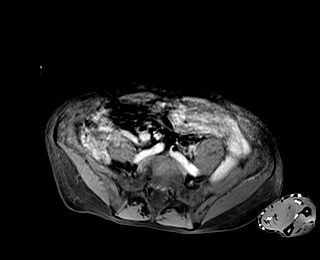
[im 27/80]
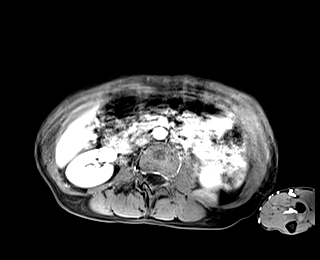
[im 53/80]
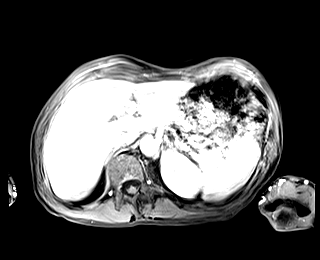
[im 80/80]
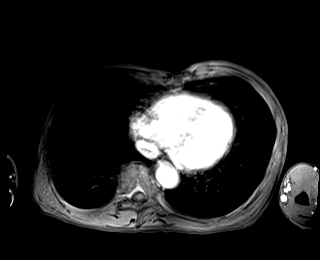

[Series 24: T1 dynamic fat-sat · axial · 3.0mm · 1.19mm/px · z∈[-186,+51]mm · 4 of 80 slices shown (3 of 4)]
[im 1/80]
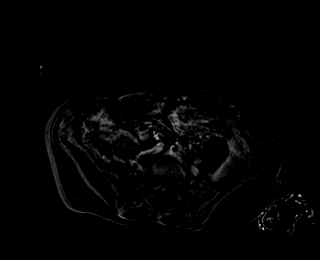
[im 27/80]
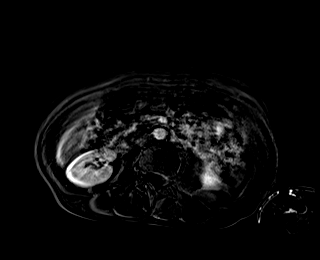
[im 53/80]
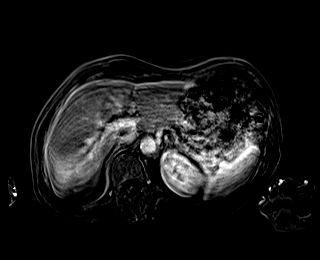
[im 80/80]
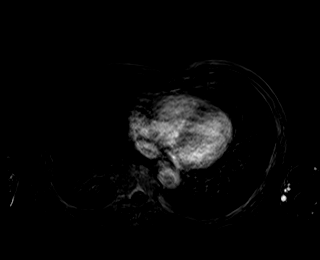

[Series 27: T1 dynamic post-contrast · coronal · 3.0mm · 1.09mm/px · 3 of 72 slices shown]
[im 1/72]
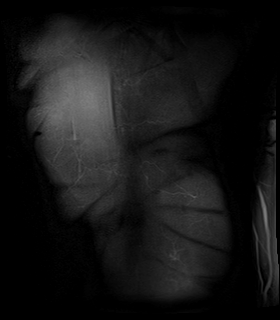
[im 36/72]
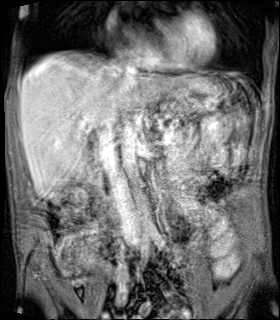
[im 72/72]
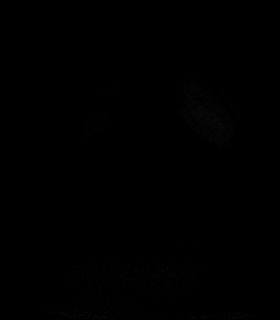

[Series 28: T1 dynamic fat-sat post-contrast · axial · 3.0mm · 1.19mm/px · z∈[-186,+51]mm · 4 of 80 slices shown (3 of 3)]
[im 1/80]
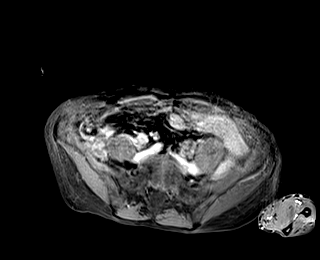
[im 27/80]
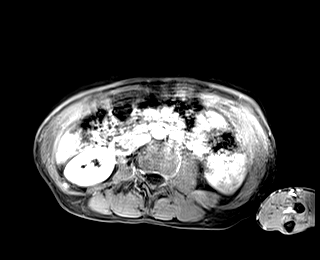
[im 53/80]
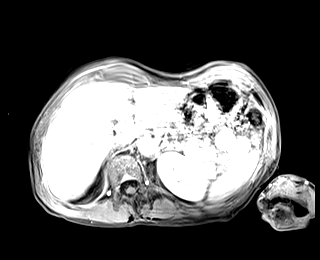
[im 80/80]
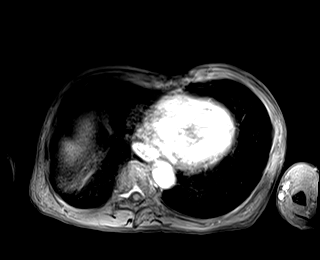

[Series 29: T1 dynamic fat-sat · axial · 3.0mm · 1.19mm/px · z∈[-186,+51]mm · 4 of 80 slices shown (4 of 4)]
[im 1/80]
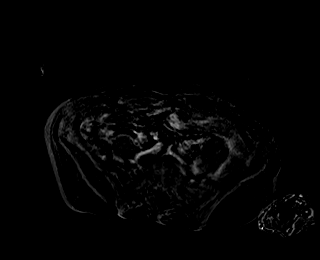
[im 27/80]
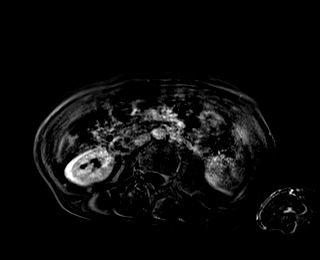
[im 53/80]
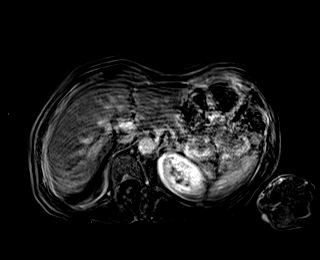
[im 80/80]
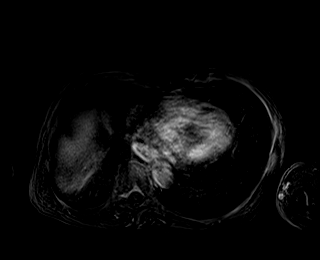

[44 of 48 positions shown; findings below may reference images not displayed]

FINDINGS: Lower chest: Unremarkable.

Hepatobiliary: Postcontrast imaging of the liver is substantially
motion degraded which could obscure small or subtle lesions. Within
this limitation, no gross mass lesion is identified within the
liver. Mild intrahepatic biliary duct prominence evident. Common
duct measures 8-9 mm diameter in the hepatoduodenal ligament with
common bile duct in the head of the pancreas measuring 7 mm
diameter. No evidence for gallstones or gallbladder wall thickening.
No pericholecystic fluid.

Pancreas: Prominence of the main pancreatic duct measuring 3-4 mm
diameter. Otherwise unremarkable on precontrast imaging. Motion
degraded assessment on postcontrast imaging.

Spleen:  No splenomegaly. No focal mass lesion.

Adrenals/Urinary Tract: No adrenal nodule or mass. No suspicious
enhancing mass lesion in either kidney.

Stomach/Bowel: Stomach is unremarkable. No gastric wall thickening.
No evidence of outlet obstruction. Duodenum is normally positioned
as is the ligament of Treitz. No small bowel or colonic dilatation
within the visualized abdomen.

Vascular/Lymphatic: No abdominal aortic aneurysm. No abdominal
lymphadenopathy.

Other:  No intraperitoneal free fluid.

Musculoskeletal: No focal suspicious marrow enhancement within the
visualized bony anatomy.
IMPRESSION: Mild intrahepatic and extrahepatic biliary duct prominence with mild
prominence of the main pancreatic duct. No choledocholithiasis. No
mass lesion identified within the pancreatic head or uncinate
process. Imaging features are nonspecific. If there is clinical
concern for ampullary lesion, ERCP may prove helpful to further
evaluate.

## 2023-08-01 ENCOUNTER — Ambulatory Visit
Admission: RE | Admit: 2023-08-01 | Discharge: 2023-08-01 | Disposition: A | Discharge status: Home / Self Care | Payer: 59 | Source: Ambulatory Visit | Attending: Emergency Medicine | Admitting: Emergency Medicine

## 2023-08-01 VITALS — BP 139/85 | HR 75 | Temp 97.7°F | Resp 16 | Ht 71.0 in | Wt 120.0 lb

## 2023-08-01 DIAGNOSIS — Z760 Encounter for issue of repeat prescription: Secondary | ICD-10-CM

## 2023-08-01 MED ORDER — QUETIAPINE FUMARATE 100 MG PO TABS: 100.0000 mg | ORAL_TABLET | Freq: Every day | ORAL | 2 refills | Status: AC

## 2023-08-01 MED ORDER — ESCITALOPRAM OXALATE 10 MG PO TABS: 15.0000 mg | ORAL_TABLET | Freq: Every day | ORAL | 2 refills | Status: AC

## 2023-08-01 NOTE — ED Provider Notes (Signed)
[facility]    CSN: [id number] Arrival date & time: 08/01/23  0803      History   Chief Complaint Chief Complaint  Patient presents with   Medication Refill    HPI Vincent [name] is a 57 y.o. male.   HPI  57 year old male with a past medical history significant for asthma, ADHD, dysthymia, MDD, and bipolar presents with request for medication refills of Seroquel, Lexapro, and Adderall.  He was being managed by [facility] but his physician retired and he reports he has not been assigned a new physician.  He does have a new patient appointment at [facility] on October 01, 2023.  Past Medical History:  Diagnosis Date   ADHD (attention deficit hyperactivity disorder)    Asthma     Patient Active Problem List   Diagnosis Date Noted   Moderate episode of recurrent major depressive disorder (HCC) 08/18/2022   Suicidal ideation 06/30/2022   Small bowel obstruction (HCC) 01/09/2022   Abdominal pain 01/08/2022   Abnormal CT of the abdomen 01/08/2022   SBO (small bowel obstruction) (HCC) 01/08/2022   Elevated LFTs 01/08/2022   History of tobacco abuse 01/08/2022   Dysthymia 04/22/2015   Anxiety 03/25/2015   ADHD (attention deficit hyperactivity disorder), combined type 02/25/2015    Past Surgical History:  Procedure Laterality Date   CHOLECYSTECTOMY     COLON SURGERY         Home Medications    Prior to Admission medications   Medication Sig Start Date End Date Taking? Authorizing Provider  amphetamine-dextroamphetamine (ADDERALL XR) 20 MG 24 hr capsule Take 2 capsules (40 mg total) by mouth daily. 01/11/23   Clapacs, Jackquline Denmark, MD  clonazePAM (KLONOPIN) 0.5 MG tablet Take 1 tablet (0.5 mg total) by mouth 2 (two) times daily. 11/30/22   Clapacs, Jackquline Denmark, MD  escitalopram (LEXAPRO) 10 MG tablet Take 1.5 tablets (15 mg total) by mouth daily. 08/01/23   Becky Augusta, NP  nicotine (NICODERM CQ - DOSED IN MG/24 HOURS) 21 mg/24hr patch Place 1  patch (21 mg total) onto the skin daily. 07/05/22   Clapacs, Jackquline Denmark, MD  QUEtiapine (SEROQUEL) 100 MG tablet Take 1 tablet (100 mg total) by mouth at bedtime. 08/01/23   Becky Augusta, NP  traZODone (DESYREL) 100 MG tablet Take 1 tablet (100 mg total) by mouth at bedtime as needed for sleep. 11/30/22   Clapacs, Jackquline Denmark, MD    Family History Family History  Problem Relation Age of Onset   Diabetes Mother    Alzheimer's disease Mother    Depression Sister    Hypertension Father     Social History Social History   Tobacco Use   Smoking status: Former    Current packs/day: 1.00    Types: Cigarettes   Smokeless tobacco: Never   Tobacco comments:    "I quit months ago"  Vaping Use   Vaping status: Never Used  Substance Use Topics   Alcohol use: Yes    Comment: special - occ   Drug use: No     Allergies   Patient has no known allergies.   Review of Systems Review of Systems  Constitutional:  Negative for fever.  Psychiatric/Behavioral:  Negative for behavioral problems and suicidal ideas. The patient is nervous/anxious.      Physical Exam Triage Vital Signs ED Triage Vitals  Encounter Vitals Group     BP      Systolic BP Percentile  Diastolic BP Percentile      Pulse      Resp      Temp      Temp src      SpO2      Weight      Height      Head Circumference      Peak Flow      Pain Score      Pain Loc      Pain Education      Exclude from Growth Chart    No data found.  Updated Vital Signs BP 139/85 (BP Location: Left Arm)   Pulse 75   Temp 97.7 F (36.5 C) (Oral)   Resp 16   Ht 5\' 11"  (1.803 m)   Wt 120 lb (54.4 kg)   SpO2 96%   BMI 16.74 kg/m   Visual Acuity Right Eye Distance:   Left Eye Distance:   Bilateral Distance:    Right Eye Near:   Left Eye Near:    Bilateral Near:     Physical Exam Vitals and nursing note reviewed.  Constitutional:      Appearance: Normal appearance. He is not ill-appearing.  Skin:    General: Skin is  warm and dry.     Capillary Refill: Capillary refill takes less than 2 seconds.  Neurological:     General: No focal deficit present.     Mental Status: He is alert and oriented to person, place, and time.  Psychiatric:        Mood and Affect: Mood normal.        Behavior: Behavior normal.        Thought Content: Thought content normal.        Judgment: Judgment normal.      UC Treatments / Results  Labs (all labs ordered are listed, but only abnormal results are displayed) Labs Reviewed - No data to display  EKG Normal sinus rhythm with incomplete right bundle branch block. Ventricular rate 65 bpm Peer interval 122 ms QRS duration 112 ms QT/QTc 412/420 ms  Radiology No results found.  Procedures Procedures (including critical care time)  Medications Ordered in UC Medications - No data to display  Initial Impression / Assessment and Plan / UC Course  I have reviewed the triage vital signs and the nursing notes.  Pertinent labs & imaging results that were available during my care of the patient were reviewed by me and considered in my medical decision making (see chart for details).   Patient is a nontoxic-appearing 57 year old male with a long psychiatric history presenting with request for refills of several of his psych medicines to include Lexapro, Seroquel, and Adderall.  Per PDMP last fill of Adderall was in April.  Patient reports that he is not primarily concerned about the Adderall but more of the Seroquel and Lexapro to prevent being rehospitalized due to his bipolar disorder.  He reports that he took the last of his medications yesterday.  I will obtain an EKG to evaluate for QT prolongation and then have advised the patient that I will refill his meds longer to bridge him until he has his new patient appointment in January.  Patient's EKG shows normal sinus rhythm with an incomplete right bundle branch block.  This incomplete RBBB was also present on EKG from  03/10/2023.  When the 2 tracings were compared there is no appreciable difference.  I we will refill patient's Seroquel and Lexapro and give him enough medication to bridge  him until he sees his PCP at his new patient appointment in January.   Final Clinical Impressions(s) / UC Diagnoses   Final diagnoses:  Medication refill     Discharge Instructions      Continue taking your Lexapro and Seroquel as previously prescribed.  Keep your new patient appointment in January as scheduled at [facility].     ED Prescriptions     Medication Sig Dispense Auth. Provider   QUEtiapine (SEROQUEL) 100 MG tablet Take 1 tablet (100 mg total) by mouth at bedtime. 30 tablet Becky Augusta, NP   escitalopram (LEXAPRO) 10 MG tablet Take 1.5 tablets (15 mg total) by mouth daily. 45 tablet Becky Augusta, NP      I have reviewed the PDMP during this encounter.   Becky Augusta, NP 08/01/23 [phone]

## 2023-08-01 NOTE — Discharge Instructions (Addendum)
Continue taking your Lexapro and Seroquel as previously prescribed.  Keep your new patient appointment in January as scheduled at [facility].

## 2023-08-01 NOTE — ED Triage Notes (Addendum)
Pt presents to UC req med refills. Needing refill on quetiapine,lexapro & adderrall. States has new PCP appt on 10/01/23.

## 2023-08-24 ENCOUNTER — Ambulatory Visit
Admission: EM | Admit: 2023-08-24 | Discharge: 2023-08-24 | Disposition: A | Discharge status: Home / Self Care | Payer: 59

## 2023-08-24 DIAGNOSIS — Z760 Encounter for issue of repeat prescription: Secondary | ICD-10-CM

## 2023-08-24 MED ORDER — ALBUTEROL SULFATE HFA 108 (90 BASE) MCG/ACT IN AERS: 1.0000 | INHALATION_SPRAY | Freq: Four times a day (QID) | RESPIRATORY_TRACT | 1 refills | Status: AC | PRN

## 2023-08-24 NOTE — ED Triage Notes (Signed)
Pt is here for a medication refill.   Pt states that his emergency inhaler ran out.

## 2023-08-24 NOTE — ED Provider Notes (Signed)
[facility]    CSN: [id number] Arrival date & time: 08/24/23  9562      History   Chief Complaint Chief Complaint  Patient presents with   Medication Refill         HPI ERCOLE IDEMA is a 57 y.o. male.   HPI  57 year old male with a past medical history significant for COPD, dysthymia, anxiety, and moderate recurrent major depressive disorder presents with the request for refill of his albuterol inhaler.  He states that he has to use it infrequently but with the change in weather he has needed his inhaler and he ran out last night.  He does have a new provider appointment scheduled for early January.  He denies any cough or shortness of breath.  Past Medical History:  Diagnosis Date   ADHD (attention deficit hyperactivity disorder)    Asthma     Patient Active Problem List   Diagnosis Date Noted   Moderate episode of recurrent major depressive disorder (HCC) 08/18/2022   Suicidal ideation 06/30/2022   Small bowel obstruction (HCC) 01/09/2022   Abdominal pain 01/08/2022   Abnormal CT of the abdomen 01/08/2022   SBO (small bowel obstruction) (HCC) 01/08/2022   Elevated LFTs 01/08/2022   History of tobacco abuse 01/08/2022   Dysthymia 04/22/2015   Anxiety 03/25/2015   ADHD (attention deficit hyperactivity disorder), combined type 02/25/2015    Past Surgical History:  Procedure Laterality Date   CHOLECYSTECTOMY     COLON SURGERY         Home Medications    Prior to Admission medications   Medication Sig Start Date End Date Taking? Authorizing Provider  amphetamine-dextroamphetamine (ADDERALL XR) 20 MG 24 hr capsule Take 2 capsules (40 mg total) by mouth daily. 01/11/23  Yes Clapacs, Jackquline Denmark, MD  clonazePAM (KLONOPIN) 0.5 MG tablet Take 1 tablet (0.5 mg total) by mouth 2 (two) times daily. 11/30/22  Yes Clapacs, Jackquline Denmark, MD  escitalopram (LEXAPRO) 10 MG tablet Take 1.5 tablets (15 mg total) by mouth daily. 08/01/23  Yes Becky Augusta, NP  nicotine  (NICODERM CQ - DOSED IN MG/24 HOURS) 21 mg/24hr patch Place 1 patch (21 mg total) onto the skin daily. 07/05/22  Yes Clapacs, Jackquline Denmark, MD  QUEtiapine (SEROQUEL) 100 MG tablet Take 1 tablet (100 mg total) by mouth at bedtime. 08/01/23  Yes Becky Augusta, NP  traZODone (DESYREL) 100 MG tablet Take 1 tablet (100 mg total) by mouth at bedtime as needed for sleep. 11/30/22  Yes Clapacs, Jackquline Denmark, MD  albuterol (VENTOLIN HFA) 108 (90 Base) MCG/ACT inhaler Inhale 1-2 puffs into the lungs every 6 (six) hours as needed for wheezing or shortness of breath. 08/24/23   Becky Augusta, NP    Family History Family History  Problem Relation Age of Onset   Diabetes Mother    Alzheimer's disease Mother    Depression Sister    Hypertension Father     Social History Social History   Tobacco Use   Smoking status: Former    Current packs/day: 1.00    Types: Cigarettes   Smokeless tobacco: Never   Tobacco comments:    "I quit months ago"  Vaping Use   Vaping status: Never Used  Substance Use Topics   Alcohol use: Yes    Comment: special - occ   Drug use: Yes    Comment: OTC THC Gummies     Allergies   Patient has no known allergies.   Review of Systems Review of  Systems  Constitutional:  Negative for fever.  Respiratory:  Negative for cough and shortness of breath.      Physical Exam Triage Vital Signs ED Triage Vitals  Encounter Vitals Group     BP      Systolic BP Percentile      Diastolic BP Percentile      Pulse      Resp      Temp      Temp src      SpO2      Weight      Height      Head Circumference      Peak Flow      Pain Score      Pain Loc      Pain Education      Exclude from Growth Chart    No data found.  Updated Vital Signs BP 115/70 (BP Location: Left Arm)   Pulse (!) 57   Temp (!) 97.5 F (36.4 C) (Oral)   Ht 5\' 11"  (1.803 m)   Wt 125 lb (56.7 kg)   SpO2 97%   BMI 17.43 kg/m   Visual Acuity Right Eye Distance:   Left Eye Distance:   Bilateral  Distance:    Right Eye Near:   Left Eye Near:    Bilateral Near:     Physical Exam Vitals and nursing note reviewed.  Constitutional:      Appearance: Normal appearance. He is not ill-appearing.  HENT:     Head: Normocephalic and atraumatic.  Cardiovascular:     Rate and Rhythm: Normal rate and regular rhythm.     Pulses: Normal pulses.     Heart sounds: Normal heart sounds. No murmur heard.    No friction rub. No gallop.  Pulmonary:     Effort: Pulmonary effort is normal.     Breath sounds: Normal breath sounds. No wheezing, rhonchi or rales.  Skin:    General: Skin is warm.     Capillary Refill: Capillary refill takes less than 2 seconds.  Neurological:     General: No focal deficit present.     Mental Status: He is alert and oriented to person, place, and time.      UC Treatments / Results  Labs (all labs ordered are listed, but only abnormal results are displayed) Labs Reviewed - No data to display  EKG   Radiology No results found.  Procedures Procedures (including critical care time)  Medications Ordered in UC Medications - No data to display  Initial Impression / Assessment and Plan / UC Course  I have reviewed the triage vital signs and the nursing notes.  Pertinent labs & imaging results that were available during my care of the patient were reviewed by me and considered in my medical decision making (see chart for details).   Patient is a nontoxic-appearing 57 year old male presenting with request for refill of his albuterol inhaler.  He does have a history of COPD and is a smoker.  He denies any cough or shortness of breath and his lungs are clear to auscultation all fields.  I will send over a refill of his albuterol.   Final Clinical Impressions(s) / UC Diagnoses   Final diagnoses:  Medication refill     Discharge Instructions      Continue to use your butyryl inhaler, 1 to 2 puffs every 4-6 hours, as needed for shortness of breath or  wheezing.  Keep your new patient appointment for January as previously  scheduled.     ED Prescriptions     Medication Sig Dispense Auth. Provider   albuterol (VENTOLIN HFA) 108 (90 Base) MCG/ACT inhaler Inhale 1-2 puffs into the lungs every 6 (six) hours as needed for wheezing or shortness of breath. 185 g Becky Augusta, NP      PDMP not reviewed this encounter.   Becky Augusta, NP 08/24/23 [phone]

## 2023-08-24 NOTE — Discharge Instructions (Addendum)
Continue to use your butyryl inhaler, 1 to 2 puffs every 4-6 hours, as needed for shortness of breath or wheezing.  Keep your new patient appointment for January as previously scheduled.

## 2023-10-01 DIAGNOSIS — Z13220 Encounter for screening for lipoid disorders: Secondary | ICD-10-CM | POA: Diagnosis not present

## 2023-10-01 DIAGNOSIS — F3160 Bipolar disorder, current episode mixed, unspecified: Secondary | ICD-10-CM | POA: Diagnosis not present

## 2023-10-01 DIAGNOSIS — R413 Other amnesia: Secondary | ICD-10-CM | POA: Diagnosis not present

## 2023-10-01 DIAGNOSIS — Z125 Encounter for screening for malignant neoplasm of prostate: Secondary | ICD-10-CM | POA: Diagnosis not present

## 2023-10-01 DIAGNOSIS — F419 Anxiety disorder, unspecified: Secondary | ICD-10-CM | POA: Diagnosis not present

## 2023-10-01 DIAGNOSIS — F902 Attention-deficit hyperactivity disorder, combined type: Secondary | ICD-10-CM | POA: Diagnosis not present

## 2023-10-08 DIAGNOSIS — Z720 Tobacco use: Secondary | ICD-10-CM | POA: Diagnosis not present

## 2023-10-08 DIAGNOSIS — R051 Acute cough: Secondary | ICD-10-CM | POA: Diagnosis not present

## 2023-12-25 ENCOUNTER — Ambulatory Visit: Payer: Self-pay | Admitting: Internal Medicine

## 2023-12-25 NOTE — Progress Notes (Deleted)
 Subjective:    Patient ID: Vincent [name], male    DOB: 1966/04/23, 58 y.o.   MRN: [id number]  HPI  Patient presents to clinic today to establish care and for management of the conditions listed below.  ADHD: He reports mainly inattention.  He is taking Adderall as prescribed.  He follows with psychiatry.  Anxiety and depression: Chronic, managed on escitalopram and clonazepam.  He is not currently seeing a therapist but is following with psychiatry.  He denies SI/HI.  Insomnia: He has difficulty.  He is taking seroquel as prescribed.  There is no sleep study on file.  Review of Systems   Past Medical History:  Diagnosis Date   ADHD (attention deficit hyperactivity disorder)    Asthma     Current Outpatient Medications  Medication Sig Dispense Refill   albuterol (VENTOLIN HFA) 108 (90 Base) MCG/ACT inhaler Inhale 1-2 puffs into the lungs every 6 (six) hours as needed for wheezing or shortness of breath. 185 g 1   amphetamine-dextroamphetamine (ADDERALL XR) 20 MG 24 hr capsule Take 2 capsules (40 mg total) by mouth daily. 60 capsule 0   clonazePAM (KLONOPIN) 0.5 MG tablet Take 1 tablet (0.5 mg total) by mouth 2 (two) times daily. 60 tablet 2   escitalopram (LEXAPRO) 10 MG tablet Take 1.5 tablets (15 mg total) by mouth daily. 45 tablet 2   nicotine (NICODERM CQ - DOSED IN MG/24 HOURS) 21 mg/24hr patch Place 1 patch (21 mg total) onto the skin daily. 28 patch 0   QUEtiapine (SEROQUEL) 100 MG tablet Take 1 tablet (100 mg total) by mouth at bedtime. 30 tablet 2   traZODone (DESYREL) 100 MG tablet Take 1 tablet (100 mg total) by mouth at bedtime as needed for sleep. 30 tablet 2   No current facility-administered medications for this visit.    No Known Allergies  Family History  Problem Relation Age of Onset   Diabetes Mother    Alzheimer's disease Mother    Depression Sister    Hypertension Father     Social History   Socioeconomic History   Marital status: Married     Spouse name: Not on file   Number of children: Not on file   Years of education: Not on file   Highest education level: Not on file  Occupational History   Not on file  Tobacco Use   Smoking status: Former    Current packs/day: 1.00    Types: Cigarettes   Smokeless tobacco: Never   Tobacco comments:    "I quit months ago"  Vaping Use   Vaping status: Never Used  Substance and Sexual Activity   Alcohol use: Yes    Comment: special - occ   Drug use: Yes    Comment: OTC THC Gummies   Sexual activity: Never  Other Topics Concern   Not on file  Social History Narrative   Not on file   Social Drivers of Health   Financial Resource Strain: Patient Declined (10/01/2023)   Received from [facility]   Overall Financial Resource Strain (CARDIA)    Difficulty of Paying Living Expenses: Patient declined  Food Insecurity: Patient Declined (10/01/2023)   Received from [facility]   Hunger Vital Sign    Worried About Running Out of Food in the Last Year: Patient declined    Ran Out of Food in the Last Year: Patient declined  Transportation Needs: Patient Declined (10/01/2023)   Received from [facility]   PRAPARE - Transportation    In the past 12 months, has lack of transportation kept you from medical appointments or from getting medications?: Patient declined    Lack of Transportation (Non-Medical): Patient declined  Physical Activity: Not on file  Stress: Not on file  Social Connections: Not on file  Intimate Partner Violence: Not At Risk (06/30/2022)   Humiliation, Afraid, Rape, and Kick questionnaire    Fear of Current or Ex-Partner: No    Emotionally Abused: No    Physically Abused: No    Sexually Abused: No     Constitutional: Denies fever, malaise, fatigue, headache or abrupt weight changes.  HEENT: Denies eye pain, eye redness, ear pain, ringing in the ears, wax buildup, runny nose, nasal congestion, bloody nose, or sore  throat. Respiratory: Denies difficulty breathing, shortness of breath, cough or sputum production.   Cardiovascular: Denies chest pain, chest tightness, palpitations or swelling in the hands or feet.  Gastrointestinal: Denies abdominal pain, bloating, constipation, diarrhea or blood in the stool.  GU: Denies urgency, frequency, pain with urination, burning sensation, blood in urine, odor or discharge. Musculoskeletal: Denies decrease in range of motion, difficulty with gait, muscle pain or joint pain and swelling.  Skin: Denies redness, rashes, lesions or ulcercations.  Neurological: Denies dizziness, difficulty with memory, difficulty with speech or problems with balance and coordination.  Psych: Denies anxiety, depression, SI/HI.  No other specific complaints in a complete review of systems (except as listed in HPI above).      Objective:   Physical Exam  There were no vitals taken for this visit. Wt Readings from Last 3 Encounters:  08/24/23 125 lb (56.7 kg)  08/01/23 120 lb (54.4 kg)  03/10/23 121 lb 14.6 oz (55.3 kg)    General: Appears their stated age, well developed, well nourished in NAD. Skin: Warm, dry and intact. No rashes, lesions or ulcerations noted. HEENT: [name]: normal shape and size; Eyes: sclera white, no icterus, conjunctiva pink, PERRLA and EOMs intact; Ears: Tm's gray and intact, normal light reflex; Nose: mucosa pink and moist, septum midline; Throat/Mouth: Teeth present, mucosa pink and moist, no exudate, lesions or ulcerations noted.  Neck:  Neck supple, trachea midline. No masses, lumps or thyromegaly present.  Cardiovascular: Normal rate and rhythm. S1,S2 noted.  No murmur, rubs or gallops noted. No JVD or BLE edema. No carotid bruits noted. Pulmonary/Chest: Normal effort and positive vesicular breath sounds. No respiratory distress. No wheezes, rales or ronchi noted.  Abdomen: Soft and nontender. Normal bowel sounds. No distention or masses noted. Liver,  spleen and kidneys non palpable. Musculoskeletal: Normal range of motion. No signs of joint swelling. No difficulty with gait.  Neurological: Alert and oriented. Cranial nerves II-XII grossly intact. Coordination normal.  Psychiatric: Mood and affect normal. Behavior is normal. Judgment and thought content normal.    BMET    Component Value Date/Time   NA 140 03/10/2023 2207   K 3.9 03/10/2023 2207   CL 108 03/10/2023 2207   CO2 25 03/10/2023 2207   GLUCOSE 86 03/10/2023 2207   BUN 13 03/10/2023 2207   CREATININE 0.76 03/10/2023 2207   CALCIUM 8.7 (L) 03/10/2023 2207   GFRNONAA >60 03/10/2023 2207    Lipid Panel  No results found for: "CHOL", "TRIG", "HDL", "CHOLHDL", "VLDL", "LDLCALC"  CBC    Component Value Date/Time   WBC 6.8 03/10/2023 2207   RBC 4.28 03/10/2023 2207   HGB 13.5 03/10/2023 2207   HCT 40.0 03/10/2023 2207  PLT 244 03/10/2023 2207   MCV 93.5 03/10/2023 2207   MCH 31.5 03/10/2023 2207   MCHC 33.8 03/10/2023 2207   RDW 13.2 03/10/2023 2207   LYMPHSABS 0.9 01/08/2022 1953   MONOABS 0.4 01/08/2022 1953   EOSABS 0.1 01/08/2022 1953   BASOSABS 0.1 01/08/2022 1953    Hgb A1C No results found for: "HGBA1C"          Assessment & Plan:    RTC in 6 months for annual exam Nicki Reaper, NP

## 2024-02-07 ENCOUNTER — Emergency Department: Payer: Self-pay

## 2024-02-07 ENCOUNTER — Other Ambulatory Visit: Payer: Self-pay

## 2024-02-07 ENCOUNTER — Encounter: Payer: Self-pay | Admitting: Emergency Medicine

## 2024-02-07 ENCOUNTER — Emergency Department
Admission: EM | Admit: 2024-02-07 | Discharge: 2024-02-07 | Disposition: A | Discharge status: Home / Self Care | Payer: Self-pay | Attending: Emergency Medicine | Admitting: Emergency Medicine

## 2024-02-07 DIAGNOSIS — J441 Chronic obstructive pulmonary disease with (acute) exacerbation: Secondary | ICD-10-CM | POA: Insufficient documentation

## 2024-02-07 LAB — CBC
HCT: 40.3 % (ref 39.0–52.0)
Hemoglobin: 13.3 g/dL (ref 13.0–17.0)
MCH: 31.7 pg (ref 26.0–34.0)
MCHC: 33 g/dL (ref 30.0–36.0)
MCV: 96 fL (ref 80.0–100.0)
Platelets: 287 10*3/uL (ref 150–400)
RBC: 4.2 MIL/uL — ABNORMAL LOW (ref 4.22–5.81)
RDW: 13.2 % (ref 11.5–15.5)
WBC: 7.5 10*3/uL (ref 4.0–10.5)
nRBC: 0 % (ref 0.0–0.2)

## 2024-02-07 LAB — BASIC METABOLIC PANEL WITH GFR
Anion gap: 7 (ref 5–15)
BUN: 19 mg/dL (ref 6–20)
CO2: 28 mmol/L (ref 22–32)
Calcium: 9 mg/dL (ref 8.9–10.3)
Chloride: 109 mmol/L (ref 98–111)
Creatinine, Ser: 0.81 mg/dL (ref 0.61–1.24)
GFR, Estimated: >60 mL/min (ref >60)
Glucose, Bld: 98 mg/dL (ref 70–99)
Potassium: 4.2 mmol/L (ref 3.5–5.1)
Sodium: 144 mmol/L (ref 135–145)

## 2024-02-07 MED ORDER — PREDNISONE 20 MG PO TABS
40.0000 mg | ORAL_TABLET | ORAL | Status: AC
  Administered 2024-02-07: 40 mg via ORAL
  Filled 2024-02-07: qty 2

## 2024-02-07 MED ORDER — IPRATROPIUM-ALBUTEROL 0.5-2.5 (3) MG/3ML IN SOLN
6.0000 mL | Freq: Once | RESPIRATORY_TRACT | Status: AC
  Administered 2024-02-07: 6 mL via RESPIRATORY_TRACT
  Filled 2024-02-07: qty 6

## 2024-02-07 MED ORDER — IPRATROPIUM-ALBUTEROL 0.5-2.5 (3) MG/3ML IN SOLN
3.0000 mL | Freq: Once | RESPIRATORY_TRACT | Status: AC
  Administered 2024-02-07: 3 mL via RESPIRATORY_TRACT

## 2024-02-07 NOTE — ED Notes (Signed)
 EDP into room, at [facility]. Pt answering in 1-2 word statements.

## 2024-02-07 NOTE — ED Notes (Signed)
 Sob, resps labored and tight, decreased and scant air movement wheezing.

## 2024-02-07 NOTE — ED Notes (Signed)
 Neb complete, "feel better", denies pain, pending xray.

## 2024-02-07 NOTE — ED Provider Notes (Signed)
 [facility] Provider Note    Event Date/Time   First MD Initiated Contact with Patient 02/07/24 [phone]     (approximate)   History   Chief Complaint: Shortness of Breath   HPI  Vincent [name] is a 58 y.o. male with a history of COPD who comes ED complaining of shortness of breath this morning.  On physical exam his chest was tight.  He did not have access to his nebulizer and did not have the ability to use his rescue medicine so he came to the ED.  Denies any cough or fever.  No worrisome chest pain.  Otherwise in his usual state of health.        Past Medical History:  Diagnosis Date   ADHD (attention deficit hyperactivity disorder)    Asthma     Current Outpatient Rx   Order #: [id number] Class: Normal   Order #: [id number] Class: Normal   Order #: [id number] Class: Normal   Order #: [id number] Class: Normal   Order #: [id number] Class: Normal   Order #: [id number] Class: Normal   Order #: [id number] Class: Normal    Past Surgical History:  Procedure Laterality Date   CHOLECYSTECTOMY     COLON SURGERY      Physical Exam   Triage Vital Signs: ED Triage Vitals  Encounter Vitals Group     BP 02/07/24 0703 130/77     Systolic BP Percentile --      Diastolic BP Percentile --      Pulse Rate 02/07/24 0703 81     Resp 02/07/24 0703 (!) 28     Temp 02/07/24 0703 97.7 F (36.5 C)     Temp Source 02/07/24 0703 Oral     SpO2 02/07/24 0703 98 %     Weight 02/07/24 0702 120 lb (54.4 kg)     Height 02/07/24 0702 5\' 11"  (1.803 m)     Head Circumference --      Peak Flow --      Pain Score 02/07/24 0702 0     Pain Loc --      Pain Education --      Exclude from Growth Chart --     Most recent vital signs: Vitals:   02/07/24 0745 02/07/24 0800  BP:  117/70  Pulse: 84 67  Resp: (!) 22 16  Temp:    SpO2: 96% 95%    General: Awake, no distress.  CV:  Good peripheral perfusion.  Regular rate rhythm, normal distal pulses Resp:  Normal effort.   Quiet breath sounds, diffuse expiratory wheezing and prolonged expiratory phase Abd:  No distention.  Soft nontender Other:  Slim body habitus, somewhat barrel chested   ED Results / Procedures / Treatments   Labs (all labs ordered are listed, but only abnormal results are displayed) Labs Reviewed  CBC - Abnormal; Notable for the following components:      Result Value   RBC 4.20 (*)    All other components within normal limits  BASIC METABOLIC PANEL WITH GFR     EKG Interpreted by me Sinus rhythm rate of 69.  Normal axis, normal intervals.  Poor R wave progression.  No acute ischemic changes.   RADIOLOGY Chest x-ray interpreted by me, unremarkable.  Radiology report reviewed   PROCEDURES:  Procedures   MEDICATIONS ORDERED IN ED: Medications  ipratropium-albuterol  (DUONEB) 0.5-2.5 (3) MG/3ML nebulizer solution 3 mL (3 mLs Nebulization Given 02/07/24 0717)  predniSONE  (DELTASONE ) tablet 40 mg (40 mg Oral Given 02/07/24  1610)  ipratropium-albuterol  (DUONEB) 0.5-2.5 (3) MG/3ML nebulizer solution 6 mL (6 mLs Nebulization Given 02/07/24 0723)     IMPRESSION / MDM / ASSESSMENT AND PLAN / ED COURSE  I reviewed the triage vital signs and the nursing notes.  DDx: COPD exacerbation/bronchospasm, pneumonia, pneumothorax, pleural effusion  Patient's presentation is most consistent with acute presentation with potential threat to life or bodily function.  Patient presents with shortness of breath and wheezing, clinically apparent episode of bronchospasm.  Patient reports he normally would have treated the symptoms at home but did not have access to his bronchodilators.  After oral prednisone  and DuoNebs, patient feels back to normal.  On reassessment, wheezing is resolved and expiratory phase is normalized.  Do not think he needs a course of steroids or antibiotics, continue his usual care at home.  Considered admission but with his rapid improvement and normal vital signs, I think he  is stable for outpatient follow-up.       FINAL CLINICAL IMPRESSION(S) / ED DIAGNOSES   Final diagnoses:  COPD exacerbation (HCC)     Rx / DC Orders   ED Discharge Orders     None        Note:  This document was prepared using Dragon voice recognition software and may include unintentional dictation errors.   Jacquie Maudlin, MD 02/07/24 [phone]

## 2024-02-07 NOTE — ED Triage Notes (Addendum)
 Pt to triage via w/c, reports +smoker, hx COPD; began having SHOB this am and did not have his inhaler; c/o chest tightness; denies cough, denies any recent illness

## 2024-03-31 ENCOUNTER — Other Ambulatory Visit: Payer: Self-pay | Admitting: Neurology

## 2024-03-31 DIAGNOSIS — G300 Alzheimer's disease with early onset: Secondary | ICD-10-CM

## 2024-07-18 ENCOUNTER — Other Ambulatory Visit: Payer: Self-pay | Admitting: Neurology

## 2024-07-18 DIAGNOSIS — G300 Alzheimer's disease with early onset: Secondary | ICD-10-CM

## 2024-07-25 ENCOUNTER — Ambulatory Visit
Admission: RE | Admit: 2024-07-25 | Discharge: 2024-07-25 | Disposition: A | Discharge status: Home / Self Care | Payer: MEDICAID | Source: Ambulatory Visit | Attending: Neurology | Admitting: Neurology

## 2024-07-25 ENCOUNTER — Other Ambulatory Visit: Payer: MEDICAID

## 2024-07-25 DIAGNOSIS — F02A0 Dementia in other diseases classified elsewhere, mild, without behavioral disturbance, psychotic disturbance, mood disturbance, and anxiety: Secondary | ICD-10-CM | POA: Diagnosis present

## 2024-07-25 DIAGNOSIS — G300 Alzheimer's disease with early onset: Secondary | ICD-10-CM | POA: Diagnosis present

## 2024-07-31 ENCOUNTER — Ambulatory Visit: Payer: MEDICAID | Attending: Neurology | Admitting: Speech Pathology

## 2024-07-31 DIAGNOSIS — R41841 Cognitive communication deficit: Secondary | ICD-10-CM | POA: Diagnosis present

## 2024-08-01 NOTE — Therapy (Signed)
 OUTPATIENT SPEECH LANGUAGE PATHOLOGY  COGNITION COMMUNICATION EVALUATION   Patient Name: Vincent [name] MRN: [id number] DOB:01/14/1966, 58 y.o., male Today's Date: 08/01/2024  PCP: No PCP listed in chart REFERRING PROVIDER: Jannett Fairly, MD   End of Session - 08/01/24 1054     Visit Number 1    Number of Visits 9    Date for Recertification  09/25/24    Authorization Type [employer]    Progress Note Due on Visit 10    SLP Start Time 1515    SLP Stop Time  1620    SLP Time Calculation (min) 65 min    Activity Tolerance Patient tolerated treatment well          Past Medical History:  Diagnosis Date   ADHD (attention deficit hyperactivity disorder)    Asthma    Past Surgical History:  Procedure Laterality Date   CHOLECYSTECTOMY     COLON SURGERY     Patient Active Problem List   Diagnosis Date Noted   Suicidal ideation 06/30/2022   Anxiety and depression 03/25/2015   ADHD (attention deficit hyperactivity disorder), combined type 02/25/2015    ONSET DATE: ~ 2020; date of referral  07/28/2024  REFERRING DIAG: R41.89 (ICD-10-CM) - Cognitive change   THERAPY DIAG:  Cognitive communication deficit  Rationale for Evaluation and Treatment Rehabilitation  SUBJECTIVE:   SUBJECTIVE STATEMENT: Pt pleasant, eager, conversant Pt accompanied by: family member- pt's brother-in-law  PERTINENT HISTORY: Pt is a 58 year old male with medical history of bipolar, anxiety/depression, ADHD, asthma, 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   Per neurology note (07/28/2024) Scored 17 out of 30 on cognitive assessment, indicating mild dementia. Patient has an elevation in p-Tau 217 without reduction in amyloid beta 42/40 ratio. Differential diagnosis includes early-onset Alzheimer's disease, supported by family history and symptom onset in mid-fifties.  DIAGNOSTIC FINDINGS:  MRI 07/25/2024 1. Mild periventricular and subcortical cerebral  white matter disease. 2. NeuroQuant analysis: Whole brain volume in the 84th percentile and hippocampal volume in the 36th percentile.  PAIN:  Are you having pain? No   FALLS: Has patient fallen in last 6 months?  No  LIVING ENVIRONMENT: Lives with: lives with their family; lives with his sister and brother-in-law Lives in: House/apartment  PLOF:  Level of assistance: Needed assistance with ADLs, Needed assistance with IADLS Employment: On disability   PATIENT GOALS   to improve cognitive communication abilities to maintain functional independence  OBJECTIVE:   COGNITIVE COMMUNICATION Overall cognitive status: pt and his brother-in-law report cognitive change around 2020 with more rapid decline in the last 3 years; especially with memory Areas of impairment:  Oriented x 4 With use of compensatory memory aids Attention: Impaired: Selective Memory: Impaired: Working Proofreader Impairments: Pt reports good use of external memory aids such as a calendar (that his sister updates), lists but has been noted to leave house door unlocked potentially d/t memory or attention difficulty. He reports starting tasks but forgets to finish them, potential hearing difficulty, has difficulty completing larger tasks but does successfully complete smaller household tasks well.   AUDITORY COMPREHENSION  Overall auditory comprehension: Appears intact YES/NO questions: Appears intact Following directions: Appears intact Conversation: Simple   READING COMPREHENSION: listens to audiobooks  EXPRESSION: verbal  VERBAL EXPRESSION:   Overall verbal expression: Appears intact Level of generative/spontaneous verbalization: conversation Automatic speech: name: intact and social response: intact  Repetition: Appears intact Pragmatics: Appears intact  Comments: Pt verbal with  no evidence of word finding difficulty, able to communicate wants/needs, medical  history Interfering components: attention Non-verbal means of communication: N/A   ORAL MOTOR EXAMINATION Facial : WFL Lingual: WFL Velum: WFL Mandible: WFL Cough: WFL Voice: WFL  MOTOR SPEECH: Overall motor speech: Appears intact Phonation: normal Resonance: WFL Articulation: Appears intact Intelligibility: Intelligible Motor planning: Appears intact   TODAY'S TREATMENT:  Skilled treatment focused on generation of compensatory memory aids such as putting checklist on inside portion of door (Leaving house checklist) to promote attention and recall of tasks such as locking the door. Also recommended placing a checklist on the inside of his bedroom door to help him create habit of making his bed, taking the trash with him, making sure that his clothes are put away before leaving his room.    PATIENT EDUCATION: Education details: see above Person educated: Patient and brother-in-law Education method: Explanation and Handouts Education comprehension: verbalized understanding and needs further education   HOME EXERCISE PROGRAM:   As above   GOALS:  Goals reviewed with patient? Yes  SHORT TERM GOALS: Target date: 10 sessions   With Supervision A, patient will use external aid for functional recall of completed/upcoming activities 80% accuracy.     Baseline:Min A Goal status: INITIAL  2.   With Supervision A, patient will demonstrate strategies for processing/recall (eg notetaking, paraphrasing) successfully by recalling >75% of details from 2 minutes verbal information.   Baseline: Min A Goal status: INITIAL  LONG TERM GOALS: Target date: 09/25/2024   With Supervision A, patient will use strategies (ie., white board, daily planner/calendar, Apps on phone) to improve memory for important information with 80% accuracy.  Baseline: Min A Goal status: INITIAL   ASSESSMENT:  CLINICAL IMPRESSION: Patient is a 58 y.o. male who was seen today for a cognitive  communication evaluation in the setting of memory loss related to recent Alzheimer's Disease Diagnosis. At this time, pt and his family are not seeking a formal cognitive assessment to quantify pt's abilities/deficits. Should they seek a formal evaluation, would recommend evaluation by neuropsychology to effectively understand the complexity of pt's presentation (setting of bipolar, ADHD, Alzheimer's Disease).   Pt reports memory loss (confirmed by pt's brother-in-law) and decreased attention to tasks. From their examples, it appears that he has deficits in working memory and short term memory (forgets information within minutes if he doesn't write it down). He benefits from using external aids (written lists) and is effective is referring to his lists for recall and task completion. While his sister keeps his calendar updated, he does refer to calendar for appts. He reports continued socialization and involvement in tasks outside of the house, continues to drive At this time, would recommend 1-2 follow up sessions to continue discussion of ways to use compensatory strategies to promote functional independence.    OBJECTIVE IMPAIRMENTS include attention, memory, and executive functioning. These impairments are limiting patient from managing medications, managing appointments, managing finances, household responsibilities, and ADLs/IADLs. Factors affecting potential to achieve goals and functional outcome are ability to learn/carryover information, co-morbidities, medical prognosis, previous level of function, and severity of impairments. Patient will benefit from skilled SLP services to address above impairments and improve overall function.  REHAB POTENTIAL: Good  PLAN: SLP FREQUENCY: 1-2x/week  SLP DURATION: 8 weeks  PLANNED INTERVENTIONS: Environmental controls, Internal/external aids, Functional tasks, SLP instruction and feedback, Compensatory strategies, and Patient/family education   Quinterius Gaida  B. Rubbie, M.S., CCC-SLP, CBIS Speech-Language Pathologist Certified Brain Injury Specialist [facility]  [facility] Office [phone] Ascom [phone] Fax [phone]

## 2024-08-04 ENCOUNTER — Ambulatory Visit: Payer: MEDICAID | Admitting: Speech Pathology

## 2024-08-04 ENCOUNTER — Telehealth: Payer: Self-pay | Admitting: Speech Pathology

## 2024-08-04 NOTE — Telephone Encounter (Signed)
 Pt was a no call/no show for today's ST session. This clinical research associate called phone number listed in pt's chart. Call went to voicemail. This writer left message regarding pt's appt.   Honorio Devol B. Rubbie, M.S., CCC-SLP, Tree Surgeon Certified Brain Injury Specialist [facility]  [facility] Rehabilitation Services Office [phone] Ascom [phone] Fax [phone]

## 2024-08-07 ENCOUNTER — Ambulatory Visit: Payer: MEDICAID | Admitting: Speech Pathology

## 2024-08-11 ENCOUNTER — Ambulatory Visit: Payer: MEDICAID | Admitting: Speech Pathology

## 2024-08-11 ENCOUNTER — Telehealth: Payer: Self-pay | Admitting: Speech Pathology

## 2024-08-11 NOTE — Telephone Encounter (Signed)
 Pt was a no call/no show again today. This clinical research associate called and left a voicemail inquiring about desire to continue ST services. Contact information provided.   Kyliegh Jester B. Rubbie, M.S., CCC-SLP, Tree Surgeon Certified Brain Injury Specialist [facility]  [facility] Rehabilitation Services Office [phone] Ascom [phone] Fax [phone]

## 2024-08-13 ENCOUNTER — Ambulatory Visit: Payer: MEDICAID | Admitting: Speech Pathology

## 2024-08-19 ENCOUNTER — Ambulatory Visit: Payer: MEDICAID | Admitting: Speech Pathology

## 2024-08-26 ENCOUNTER — Ambulatory Visit: Payer: MEDICAID | Admitting: Speech Pathology

## 2024-08-28 ENCOUNTER — Ambulatory Visit: Payer: MEDICAID | Admitting: Speech Pathology

## 2024-09-02 ENCOUNTER — Ambulatory Visit: Payer: MEDICAID | Admitting: Speech Pathology

## 2024-09-04 ENCOUNTER — Ambulatory Visit: Payer: MEDICAID | Admitting: Speech Pathology

## 2024-09-08 ENCOUNTER — Ambulatory Visit: Payer: MEDICAID | Admitting: Speech Pathology

## 2024-09-09 ENCOUNTER — Ambulatory Visit: Payer: MEDICAID | Admitting: Speech Pathology

## 2024-09-11 ENCOUNTER — Ambulatory Visit: Payer: MEDICAID | Admitting: Speech Pathology

## 2024-09-16 ENCOUNTER — Ambulatory Visit: Payer: MEDICAID | Admitting: Speech Pathology

## 2024-09-19 ENCOUNTER — Other Ambulatory Visit (HOSPITAL_COMMUNITY): Payer: Self-pay

## 2024-09-20 ENCOUNTER — Other Ambulatory Visit: Payer: Self-pay

## 2024-09-20 ENCOUNTER — Other Ambulatory Visit (HOSPITAL_COMMUNITY): Payer: Self-pay

## 2024-09-20 MED ORDER — LISDEXAMFETAMINE DIMESYLATE 10 MG PO CAPS
10.0000 mg | ORAL_CAPSULE | ORAL | 0 refills | Status: DC
  Filled 2024-09-20 – 2024-09-22 (×2): qty 30, 30d supply, fill #0

## 2024-09-22 ENCOUNTER — Other Ambulatory Visit (HOSPITAL_COMMUNITY): Payer: Self-pay

## 2024-09-23 ENCOUNTER — Ambulatory Visit: Payer: MEDICAID | Admitting: Speech Pathology

## 2024-09-29 ENCOUNTER — Ambulatory Visit: Payer: MEDICAID | Admitting: Speech Pathology

## 2024-10-01 ENCOUNTER — Ambulatory Visit: Payer: MEDICAID | Admitting: Speech Pathology

## 2024-10-06 ENCOUNTER — Ambulatory Visit: Payer: MEDICAID | Admitting: Speech Pathology

## 2024-10-09 ENCOUNTER — Ambulatory Visit: Payer: MEDICAID | Admitting: Speech Pathology

## 2024-10-13 ENCOUNTER — Ambulatory Visit: Payer: MEDICAID | Admitting: Speech Pathology

## 2024-10-15 ENCOUNTER — Ambulatory Visit: Payer: MEDICAID | Admitting: Speech Pathology

## 2024-10-20 ENCOUNTER — Ambulatory Visit: Payer: MEDICAID | Admitting: Speech Pathology

## 2024-10-22 ENCOUNTER — Ambulatory Visit: Payer: MEDICAID | Admitting: Speech Pathology

## 2024-10-25 ENCOUNTER — Other Ambulatory Visit (HOSPITAL_COMMUNITY): Payer: Self-pay

## 2024-10-25 MED ORDER — QUETIAPINE FUMARATE 100 MG PO TABS
100.0000 mg | ORAL_TABLET | Freq: Every day | ORAL | 0 refills | Status: AC
  Filled 2024-10-25 – 2024-10-31 (×2): qty 90, 90d supply, fill #0

## 2024-10-25 MED ORDER — LISDEXAMFETAMINE DIMESYLATE 10 MG PO CAPS
10.0000 mg | ORAL_CAPSULE | Freq: Every morning | ORAL | 0 refills | Status: AC
  Filled 2024-10-25: qty 30, 30d supply, fill #0

## 2024-10-25 MED ORDER — ESCITALOPRAM OXALATE 20 MG PO TABS
20.0000 mg | ORAL_TABLET | Freq: Every day | ORAL | 0 refills | Status: AC
  Filled 2024-10-25: qty 90, 90d supply, fill #0

## 2024-10-27 ENCOUNTER — Other Ambulatory Visit (HOSPITAL_COMMUNITY): Payer: Self-pay

## 2024-10-31 ENCOUNTER — Other Ambulatory Visit (HOSPITAL_COMMUNITY): Payer: Self-pay
